# Patient Record
Sex: Male | Born: 1960
Health system: Southern US, Community
[De-identification: ages and names within clinical notes are randomized; demographics above are authoritative.]

## PROBLEM LIST (undated history)

## (undated) DIAGNOSIS — I10 Essential (primary) hypertension: Secondary | ICD-10-CM

## (undated) DIAGNOSIS — E7800 Pure hypercholesterolemia, unspecified: Secondary | ICD-10-CM

## (undated) DIAGNOSIS — R7302 Impaired glucose tolerance (oral): Secondary | ICD-10-CM

## (undated) DIAGNOSIS — E119 Type 2 diabetes mellitus without complications: Secondary | ICD-10-CM

## (undated) DIAGNOSIS — I509 Heart failure, unspecified: Secondary | ICD-10-CM

## (undated) DIAGNOSIS — K219 Gastro-esophageal reflux disease without esophagitis: Secondary | ICD-10-CM

## (undated) DIAGNOSIS — E785 Hyperlipidemia, unspecified: Secondary | ICD-10-CM

## (undated) DIAGNOSIS — N200 Calculus of kidney: Secondary | ICD-10-CM

## (undated) DIAGNOSIS — N529 Male erectile dysfunction, unspecified: Secondary | ICD-10-CM

## (undated) DIAGNOSIS — H409 Unspecified glaucoma: Secondary | ICD-10-CM

## (undated) DIAGNOSIS — T7840XA Allergy, unspecified, initial encounter: Secondary | ICD-10-CM

## (undated) DIAGNOSIS — E669 Obesity, unspecified: Secondary | ICD-10-CM

## (undated) DIAGNOSIS — M1990 Unspecified osteoarthritis, unspecified site: Secondary | ICD-10-CM

## (undated) HISTORY — PX: GYNECOMASTIA EXCISION: SHX5170

## (undated) HISTORY — DX: Impaired glucose tolerance (oral): R73.02

## (undated) HISTORY — DX: Allergy, unspecified, initial encounter: T78.40XA

## (undated) HISTORY — DX: Unspecified osteoarthritis, unspecified site: M19.90

## (undated) HISTORY — DX: Heart failure, unspecified: I50.9

## (undated) HISTORY — DX: Unspecified glaucoma: H40.9

## (undated) HISTORY — DX: Type 2 diabetes mellitus without complications: E11.9

## (undated) HISTORY — PX: POLYPECTOMY: SHX149

## (undated) HISTORY — DX: Pure hypercholesterolemia, unspecified: E78.00

## (undated) HISTORY — DX: Essential (primary) hypertension: I10

## (undated) HISTORY — PX: OTHER SURGICAL HISTORY: SHX169

## (undated) HISTORY — DX: Male erectile dysfunction, unspecified: N52.9

## (undated) HISTORY — DX: Hyperlipidemia, unspecified: E78.5

## (undated) HISTORY — PX: COLONOSCOPY: SHX174

## (undated) HISTORY — DX: Calculus of kidney: N20.0

## (undated) HISTORY — DX: Obesity, unspecified: E66.9

## (undated) HISTORY — DX: Gastro-esophageal reflux disease without esophagitis: K21.9

---

## 1993-03-22 ENCOUNTER — Encounter: Payer: Self-pay | Admitting: Internal Medicine

## 2000-10-15 ENCOUNTER — Encounter: Payer: Self-pay | Admitting: Orthopedic Surgery

## 2000-10-15 ENCOUNTER — Encounter: Admission: RE | Admit: 2000-10-15 | Discharge: 2000-10-15 | Payer: Self-pay | Admitting: Orthopedic Surgery

## 2004-08-26 ENCOUNTER — Ambulatory Visit: Payer: Self-pay | Admitting: Internal Medicine

## 2004-09-09 ENCOUNTER — Ambulatory Visit: Payer: Self-pay | Admitting: Internal Medicine

## 2005-03-29 ENCOUNTER — Ambulatory Visit: Payer: Self-pay | Admitting: Internal Medicine

## 2006-03-26 ENCOUNTER — Ambulatory Visit: Payer: Self-pay | Admitting: Internal Medicine

## 2006-04-02 ENCOUNTER — Ambulatory Visit: Payer: Self-pay | Admitting: Internal Medicine

## 2006-05-04 ENCOUNTER — Ambulatory Visit: Payer: Self-pay | Admitting: Internal Medicine

## 2006-05-04 LAB — CONVERTED CEMR LAB
BUN: 9 mg/dL (ref 6–23)
CO2: 29 meq/L (ref 19–32)
Calcium: 9.5 mg/dL (ref 8.4–10.5)
Chloride: 105 meq/L (ref 96–112)
Creatinine, Ser: 0.8 mg/dL (ref 0.4–1.5)
GFR calc non Af Amer: 111 mL/min
Glomerular Filtration Rate, Af Am: 134 mL/min/{1.73_m2}
Glucose, Bld: 98 mg/dL (ref 70–99)
Potassium: 4.1 meq/L (ref 3.5–5.1)
Sodium: 139 meq/L (ref 135–145)

## 2006-08-03 ENCOUNTER — Ambulatory Visit: Payer: Self-pay | Admitting: Internal Medicine

## 2006-11-02 ENCOUNTER — Ambulatory Visit: Payer: Self-pay | Admitting: Internal Medicine

## 2007-01-22 DIAGNOSIS — Z862 Personal history of diseases of the blood and blood-forming organs and certain disorders involving the immune mechanism: Secondary | ICD-10-CM

## 2007-01-22 DIAGNOSIS — E669 Obesity, unspecified: Secondary | ICD-10-CM

## 2007-01-22 DIAGNOSIS — J301 Allergic rhinitis due to pollen: Secondary | ICD-10-CM | POA: Insufficient documentation

## 2007-01-22 DIAGNOSIS — F528 Other sexual dysfunction not due to a substance or known physiological condition: Secondary | ICD-10-CM | POA: Insufficient documentation

## 2007-01-22 DIAGNOSIS — Z8639 Personal history of other endocrine, nutritional and metabolic disease: Secondary | ICD-10-CM

## 2007-01-22 DIAGNOSIS — K219 Gastro-esophageal reflux disease without esophagitis: Secondary | ICD-10-CM

## 2007-01-22 DIAGNOSIS — I10 Essential (primary) hypertension: Secondary | ICD-10-CM | POA: Insufficient documentation

## 2007-06-08 ENCOUNTER — Encounter: Payer: Self-pay | Admitting: Internal Medicine

## 2007-06-28 ENCOUNTER — Encounter: Payer: Self-pay | Admitting: Internal Medicine

## 2007-11-07 ENCOUNTER — Encounter: Payer: Self-pay | Admitting: Internal Medicine

## 2007-11-08 ENCOUNTER — Ambulatory Visit: Payer: Self-pay | Admitting: Internal Medicine

## 2007-11-08 DIAGNOSIS — M25519 Pain in unspecified shoulder: Secondary | ICD-10-CM

## 2008-04-26 ENCOUNTER — Ambulatory Visit: Payer: Self-pay | Admitting: Internal Medicine

## 2008-05-04 ENCOUNTER — Ambulatory Visit: Payer: Self-pay | Admitting: Internal Medicine

## 2008-05-04 DIAGNOSIS — Z87442 Personal history of urinary calculi: Secondary | ICD-10-CM

## 2008-05-04 DIAGNOSIS — J309 Allergic rhinitis, unspecified: Secondary | ICD-10-CM | POA: Insufficient documentation

## 2008-05-11 ENCOUNTER — Encounter: Payer: Self-pay | Admitting: Internal Medicine

## 2008-05-15 LAB — CONVERTED CEMR LAB
Albumin: 3.9 g/dL (ref 3.5–5.2)
Alkaline Phosphatase: 52 units/L (ref 39–117)
BUN: 14 mg/dL (ref 6–23)
Bilirubin Urine: NEGATIVE
Blood in Urine, dipstick: NEGATIVE
Cholesterol: 186 mg/dL (ref 0–200)
Creatinine, Ser: 0.8 mg/dL (ref 0.4–1.5)
Eosinophils Absolute: 0.1 10*3/uL (ref 0.0–0.7)
Eosinophils Relative: 1.2 % (ref 0.0–5.0)
GFR calc Af Amer: 133 mL/min
GFR calc non Af Amer: 110 mL/min
Glucose, Urine, Semiquant: NEGATIVE
HCT: 39.2 % (ref 39.0–52.0)
HDL: 27.2 mg/dL — ABNORMAL LOW (ref >39.0)
Ketones, urine, test strip: NEGATIVE
MCV: 89.8 fL (ref 78.0–100.0)
Monocytes Absolute: 0.4 10*3/uL (ref 0.1–1.0)
PSA: 0.4 ng/mL (ref 0.10–4.00)
Platelets: 179 10*3/uL (ref 150–400)
Potassium: 4.2 meq/L (ref 3.5–5.1)
Specific Gravity, Urine: 1.015
TSH: 1.28 microintl units/mL (ref 0.35–5.50)
Testosterone: 340.86 ng/dL — ABNORMAL LOW (ref 350.00–890)
Triglycerides: 174 mg/dL — ABNORMAL HIGH (ref 0–149)
WBC Urine, dipstick: NEGATIVE
WBC: 4.7 10*3/uL (ref 4.5–10.5)
pH: 6.5

## 2008-05-18 ENCOUNTER — Telehealth: Payer: Self-pay | Admitting: Internal Medicine

## 2008-05-26 ENCOUNTER — Ambulatory Visit: Payer: Self-pay | Admitting: Podiatry

## 2008-12-12 ENCOUNTER — Encounter: Admission: RE | Admit: 2008-12-12 | Discharge: 2008-12-12 | Payer: Self-pay | Admitting: Podiatry

## 2009-01-04 ENCOUNTER — Encounter: Payer: Self-pay | Admitting: Internal Medicine

## 2009-07-03 ENCOUNTER — Ambulatory Visit: Payer: Self-pay | Admitting: Internal Medicine

## 2009-07-03 LAB — CONVERTED CEMR LAB
AST: 27 units/L (ref 0–37)
Alkaline Phosphatase: 51 units/L (ref 39–117)
Basophils Absolute: 0 10*3/uL (ref 0.0–0.1)
Basophils Relative: 0.2 % (ref 0.0–3.0)
Bilirubin, Direct: 0.2 mg/dL (ref 0.0–0.3)
Calcium: 9.9 mg/dL (ref 8.4–10.5)
Chloride: 107 meq/L (ref 96–112)
Creatinine, Ser: 0.8 mg/dL (ref 0.4–1.5)
Direct LDL: 147.2 mg/dL
Eosinophils Absolute: 0.1 10*3/uL (ref 0.0–0.7)
GFR calc non Af Amer: 131.97 mL/min (ref >60)
Glucose, Bld: 117 mg/dL — ABNORMAL HIGH (ref 70–99)
Glucose, Urine, Semiquant: NEGATIVE
Lymphocytes Relative: 36.6 % (ref 12.0–46.0)
MCHC: 33.7 g/dL (ref 30.0–36.0)
Monocytes Relative: 10 % (ref 3.0–12.0)
Neutrophils Relative %: 51.8 % (ref 43.0–77.0)
Potassium: 4.8 meq/L (ref 3.5–5.1)
RBC: 4.55 M/uL (ref 4.22–5.81)
Specific Gravity, Urine: 1.02
Total Protein: 7.8 g/dL (ref 6.0–8.3)
WBC Urine, dipstick: NEGATIVE
pH: 5.5

## 2009-07-13 ENCOUNTER — Ambulatory Visit: Payer: Self-pay | Admitting: Internal Medicine

## 2010-01-15 ENCOUNTER — Ambulatory Visit: Payer: Self-pay | Admitting: Internal Medicine

## 2010-01-15 DIAGNOSIS — N62 Hypertrophy of breast: Secondary | ICD-10-CM

## 2010-01-16 ENCOUNTER — Telehealth: Payer: Self-pay | Admitting: Internal Medicine

## 2010-01-21 ENCOUNTER — Encounter: Payer: Self-pay | Admitting: Internal Medicine

## 2010-02-15 ENCOUNTER — Telehealth: Payer: Self-pay | Admitting: Internal Medicine

## 2010-03-26 ENCOUNTER — Ambulatory Visit: Payer: Self-pay | Admitting: Internal Medicine

## 2010-03-26 DIAGNOSIS — J069 Acute upper respiratory infection, unspecified: Secondary | ICD-10-CM | POA: Insufficient documentation

## 2010-07-12 ENCOUNTER — Ambulatory Visit: Admit: 2010-07-12 | Payer: Self-pay | Admitting: Internal Medicine

## 2010-07-18 ENCOUNTER — Ambulatory Visit
Admission: RE | Admit: 2010-07-18 | Discharge: 2010-07-18 | Payer: Self-pay | Source: Home / Self Care | Attending: Internal Medicine | Admitting: Internal Medicine

## 2010-07-26 ENCOUNTER — Ambulatory Visit
Admission: RE | Admit: 2010-07-26 | Discharge: 2010-07-26 | Payer: Self-pay | Source: Home / Self Care | Attending: Internal Medicine | Admitting: Internal Medicine

## 2010-07-26 ENCOUNTER — Other Ambulatory Visit: Payer: Self-pay | Admitting: Internal Medicine

## 2010-07-26 LAB — LIPID PANEL
Cholesterol: 220 mg/dL — ABNORMAL HIGH (ref 0–200)
HDL: 34.7 mg/dL — ABNORMAL LOW (ref >39.00)
Total CHOL/HDL Ratio: 6
Triglycerides: 177 mg/dL — ABNORMAL HIGH (ref 0.0–149.0)
VLDL: 35.4 mg/dL (ref 0.0–40.0)

## 2010-07-26 LAB — CBC WITH DIFFERENTIAL/PLATELET
Basophils Absolute: 0 10*3/uL (ref 0.0–0.1)
Basophils Relative: 0.4 % (ref 0.0–3.0)
Eosinophils Absolute: 0 10*3/uL (ref 0.0–0.7)
Eosinophils Relative: 0.9 % (ref 0.0–5.0)
HCT: 45.1 % (ref 39.0–52.0)
Hemoglobin: 15.9 g/dL (ref 13.0–17.0)
Lymphocytes Relative: 39.2 % (ref 12.0–46.0)
Lymphs Abs: 1.8 10*3/uL (ref 0.7–4.0)
MCHC: 35.3 g/dL (ref 30.0–36.0)
MCV: 89.6 fl (ref 78.0–100.0)
Monocytes Absolute: 0.4 10*3/uL (ref 0.1–1.0)
Monocytes Relative: 7.8 % (ref 3.0–12.0)
Neutro Abs: 2.4 10*3/uL (ref 1.4–7.7)
Neutrophils Relative %: 51.7 % (ref 43.0–77.0)
Platelets: 213 10*3/uL (ref 150.0–400.0)
RBC: 5.04 Mil/uL (ref 4.22–5.81)
RDW: 14 % (ref 11.5–14.6)
WBC: 4.6 10*3/uL (ref 4.5–10.5)

## 2010-07-26 LAB — HEPATIC FUNCTION PANEL
ALT: 26 U/L (ref 0–53)
AST: 25 U/L (ref 0–37)
Albumin: 4 g/dL (ref 3.5–5.2)
Alkaline Phosphatase: 70 U/L (ref 39–117)
Bilirubin, Direct: 0.2 mg/dL (ref 0.0–0.3)
Total Bilirubin: 1.3 mg/dL — ABNORMAL HIGH (ref 0.3–1.2)
Total Protein: 7.7 g/dL (ref 6.0–8.3)

## 2010-07-26 LAB — CONVERTED CEMR LAB
Blood in Urine, dipstick: NEGATIVE
Glucose, Urine, Semiquant: NEGATIVE
Ketones, urine, test strip: NEGATIVE
Specific Gravity, Urine: >=1.03
pH: 5.5

## 2010-07-26 LAB — LDL CHOLESTEROL, DIRECT: Direct LDL: 166.6 mg/dL

## 2010-07-26 LAB — PSA: PSA: 0.53 ng/mL (ref 0.10–4.00)

## 2010-07-26 LAB — BASIC METABOLIC PANEL
BUN: 14 mg/dL (ref 6–23)
CO2: 27 mEq/L (ref 19–32)
Calcium: 9.5 mg/dL (ref 8.4–10.5)
Chloride: 105 mEq/L (ref 96–112)
Creatinine, Ser: 0.8 mg/dL (ref 0.4–1.5)
GFR: 135.3 mL/min (ref >60.00)
Glucose, Bld: 117 mg/dL — ABNORMAL HIGH (ref 70–99)
Potassium: 4.2 mEq/L (ref 3.5–5.1)
Sodium: 139 mEq/L (ref 135–145)

## 2010-07-26 LAB — TSH: TSH: 1.1 u[IU]/mL (ref 0.35–5.50)

## 2010-08-02 ENCOUNTER — Telehealth: Payer: Self-pay | Admitting: Internal Medicine

## 2010-08-06 NOTE — Medication Information (Signed)
Summary: Lansoprazole Approved  Lansoprazole Approved   Imported By: Maryln Gottron 01/24/2010 11:02:59  _____________________________________________________________________  External Attachment:    Type:   Image     Comment:   External Document

## 2010-08-06 NOTE — Progress Notes (Signed)
Summary: rx for prevacid  Phone Note Call from Patient   Caller: Patient Reason for Call: Refill Medication Summary of Call: Pt adv that he needs to have a Rx for Prevacid sent to [facility] on [address] in [place]..... Pt adv that his benefit card will not pay on med (OTC) w/o a prescription.  Pt can be reached at [phone] with any questions or concerns.  Initial call taken by: Debbra Riding,  January 16, 2010 4:00 PM  Follow-up for Phone Call        Pt called and said to be sure to send it to [facility]  on [address]. [phone]    Follow-up by: Lucy Antigua,  January 16, 2010 4:04 PM  Additional Follow-up for Phone Call Additional follow up Details #1::        please advise - med list has omeprazole on it - requesting rx for prevacid.  Additional Follow-up by: Duard Brady LPN,  January 17, 2010 8:58 AM    Additional Follow-up for Phone Call Additional follow up Details #2::    Prevacid 30 mg  #90   RF 6 Follow-up by: Gordy Savers  MD,  January 17, 2010 12:28 PM  Additional Follow-up for Phone Call Additional follow up Details #3:: Details for Additional Follow-up Action Taken: change to med list - efilled [facility] Additional Follow-up by: Duard Brady LPN,  January 17, 2010 1:16 PM  New/Updated Medications: PREVACID 30 MG CPDR (LANSOPRAZOLE) qd Prescriptions: PREVACID 30 MG CPDR (LANSOPRAZOLE) qd  #90 x 6   Entered by:   Duard Brady LPN   Authorized by:   Gordy Savers  MD   Signed by:   Duard Brady LPN on 32/44/0102   Method used:   Electronically to        [facility]. [address] [phone]* (retail)       [address] [place], [place]  [id number]       Ph: [phone]       Fax: [phone]   RxID:   [id number] PREVACID 30 MG CPDR (LANSOPRAZOLE) qd  #90 x 3   Entered by:   Duard Brady LPN   Authorized by:   Gordy Savers  MD   Signed by:   Duard Brady LPN on 57/32/2025  Method used:   Electronically to        [facility] [facility] [employer] [phone]* (retail)       [address] [place], [place]  [address]       Ph: [phone] or [phone]       Fax: [phone]   RxID:   [id number]  [facility] - [facility] and cx - sent ti [facility] as indicated in phone note. KIK

## 2010-08-06 NOTE — Assessment & Plan Note (Signed)
Summary: 6 month rov/njr   Vital Signs:  Patient profile:   50 year old male Weight:      277 pounds Temp:     98.2 degrees F oral BP sitting:   110 / 70  (right arm) Cuff size:   large  Vitals Entered By: Duard Brady LPN (January 15, 2010 4:43 PM) CC: 6 mos rov - doing well Is Patient Diabetic? No   CC:  6 mos rov - doing well.  History of Present Illness: 50 -year-old patient who is sent for follow-up of his hypertension.  Medical regimen includes Aldactone, and he states that he has had progressive gynecomastia of the past two years.  He is also been diagnosed with testosterone deficiency is not been using replacement.  Medications.  He feels well today, but has not been successful at weight loss.  He has a history of mild impaired glucose tolerance with a fasting blood sugar 117 6 months ago and.  A random blood sugar today 77.  Is also on omeprazole for mild reflux symptoms  Allergies (verified): No Known Drug Allergies  Past History:  Past Medical History: Reviewed history from 07/13/2009 and no changes required. GERD Hypertension Allergic rhinitis Nephrolithiasis, hx of obesity erectile dysfunction impaired glucose tolerance  Review of Systems  The patient denies anorexia, fever, weight loss, weight gain, vision loss, decreased hearing, hoarseness, chest pain, syncope, dyspnea on exertion, peripheral edema, prolonged cough, headaches, hemoptysis, abdominal pain, melena, hematochezia, severe indigestion/heartburn, hematuria, incontinence, genital sores, muscle weakness, suspicious skin lesions, transient blindness, difficulty walking, depression, unusual weight change, abnormal bleeding, enlarged lymph nodes, angioedema, breast masses, and testicular masses.    Physical Exam  General:  Well-developed,well-nourished,in no acute distress; alert,appropriate and cooperative throughout examination; blood pressure well controlled Mouth:  Oral mucosa and oropharynx  without lesions or exudates.  Teeth in good repair. Neck:  No deformities, masses, or tenderness noted. Breasts:  gynecomastia.   Lungs:  Normal respiratory effort, chest expands symmetrically. Lungs are clear to auscultation, no crackles or wheezes. Heart:  Normal rate and regular rhythm. S1 and S2 normal without gallop, murmur, click, rub or other extra sounds. Abdomen:  Bowel sounds positive,abdomen soft and non-tender without masses, organomegaly or hernias noted. Msk:  No deformity or scoliosis noted of thoracic or lumbar spine.   Pulses:  R and L carotid,radial,femoral,dorsalis pedis and posterior tibial pulses are full and equal bilaterally   Impression & Recommendations:  Problem # 1:  GYNECOMASTIA (ICD-611.1) will discontinue the Aldactone.  Doubtful, omeprazole much of the factor.  He will consider switching to H2 blocker therapy.  His testosterone deficiency probably also a factor.  He will consider resuming Testim  Problem # 2:  HYPERTENSION (ICD-401.9)  The following medications were removed from the medication list:    Aldactone 50 Mg Tabs (Spironolactone) .Marland Kitchen... 1 once daily His updated medication list for this problem includes:    Norvasc 10 Mg Tabs (Amlodipine besylate) .Marland Kitchen... 1 once daily    Toprol Xl 100 Mg Tb24 (Metoprolol succinate) .Marland Kitchen... 1 once daily    Benazepril Hcl 40 Mg Tabs (Benazepril hcl) .Marland Kitchen... 1 once daily    Clonidine Hcl 0.1 Mg Tabs (Clonidine hcl) .Marland Kitchen... 1 at bedtime  Problem # 3:  GERD (ICD-530.81)  His updated medication list for this problem includes:    Omeprazole 20 Mg Cpdr (Omeprazole) .Marland Kitchen... 1 once daily  Problem # 4:  ERECTILE DYSFUNCTION (ICD-302.72)  The following medications were removed from the medication list:  Viagra 50 Mg Tabs (Sildenafil citrate) .Marland Kitchen... As dir His updated medication list for this problem includes:    Levitra 10 Mg Tabs (Vardenafil hcl) ..... One daily as needed  Complete Medication List: 1)  Norvasc 10 Mg Tabs  (Amlodipine besylate) .Marland Kitchen.. 1 once daily 2)  Omeprazole 20 Mg Cpdr (Omeprazole) .Marland Kitchen.. 1 once daily 3)  Toprol Xl 100 Mg Tb24 (Metoprolol succinate) .Marland Kitchen.. 1 once daily 4)  Benazepril Hcl 40 Mg Tabs (Benazepril hcl) .Marland Kitchen.. 1 once daily 5)  Clonidine Hcl 0.1 Mg Tabs (Clonidine hcl) .Marland Kitchen.. 1 at bedtime 6)  Testim 1 % Gel (Testosterone) .... Use every morning 7)  Levitra 10 Mg Tabs (Vardenafil hcl) .... One daily as needed  Patient Instructions: 1)  Limit your Sodium (Salt). 2)  It is important that you exercise regularly at least 20 minutes 5 times a week. If you develop chest pain, have severe difficulty breathing, or feel very tired , stop exercising immediately and seek medical attention. 3)  You need to lose weight. Consider a lower calorie diet and regular exercise.  4)  Check your Blood Pressure regularly. If it is above: 150/90 you should make an appointment. Prescriptions: TOPROL XL 100 MG TB24 (METOPROLOL SUCCINATE) 1 once daily  #90 x 6   Entered and Authorized by:   Gordy Savers  MD   Signed by:   Gordy Savers  MD on 01/15/2010   Method used:   Electronically to        [facility]* (retail)             ,          Ph: [phone]       Fax: [phone]   RxID:   [id number] OMEPRAZOLE 20 MG CPDR (OMEPRAZOLE) 1 once daily  #90 x 3   Entered and Authorized by:   Gordy Savers  MD   Signed by:   Gordy Savers  MD on 01/15/2010   Method used:   Electronically to        [facility]* (retail)             ,          Ph: [phone]       Fax: [phone]   RxID:   [id number] NORVASC 10 MG TABS (AMLODIPINE BESYLATE) 1 once daily  #90 x 3   Entered and Authorized by:   Gordy Savers  MD   Signed by:   Gordy Savers  MD on 01/15/2010   Method used:   Electronically to        [facility]* (retail)             ,          Ph: [phone]       Fax: [phone]   RxID:   [id number] BENAZEPRIL HCL 40 MG  TABS (BENAZEPRIL HCL)  1 once daily  #90 x 3   Entered and Authorized by:   Gordy Savers  MD   Signed by:   Gordy Savers  MD on 01/15/2010   Method used:   Electronically to        [facility]* (retail)             ,          Ph: [phone]       Fax: [phone]   RxID:   [id number] CLONIDINE HCL 0.1 MG  TABS (CLONIDINE HCL) 1 at  bedtime  #90 x 6   Entered and Authorized by:   Gordy Savers  MD   Signed by:   Gordy Savers  MD on 01/15/2010   Method used:   Electronically to        [facility]* (retail)             ,          Ph: [phone]       Fax: [phone]   RxID:   [id number] LEVITRA 10 MG TABS (VARDENAFIL HCL) one daily as needed  #6 x 6   Entered and Authorized by:   Gordy Savers  MD   Signed by:   Gordy Savers  MD on 01/15/2010   Method used:   Print then Give to Patient   RxID:   [id number] TESTIM 1 % GEL (TESTOSTERONE) use every morning  #90 x 6   Entered and Authorized by:   Gordy Savers  MD   Signed by:   Gordy Savers  MD on 01/15/2010   Method used:   Print then Give to Patient   RxID:   [id number] CLONIDINE HCL 0.1 MG  TABS (CLONIDINE HCL) 1 at bedtime  #90 x 6   Entered and Authorized by:   Gordy Savers  MD   Signed by:   Gordy Savers  MD on 01/15/2010   Method used:   Print then Give to Patient   RxID:   [id number] BENAZEPRIL HCL 40 MG  TABS (BENAZEPRIL HCL) 1 once daily  #90 x 3   Entered and Authorized by:   Gordy Savers  MD   Signed by:   Gordy Savers  MD on 01/15/2010   Method used:   Print then Give to Patient   RxID:   [id number] TOPROL XL 100 MG TB24 (METOPROLOL SUCCINATE) 1 once daily  #90 x 6   Entered and Authorized by:   Gordy Savers  MD   Signed by:   Gordy Savers  MD on 01/15/2010   Method used:   Print then Give to Patient   RxID:   [id number] OMEPRAZOLE 20 MG CPDR (OMEPRAZOLE) 1 once daily  #90 x 3   Entered and  Authorized by:   Gordy Savers  MD   Signed by:   Gordy Savers  MD on 01/15/2010   Method used:   Print then Give to Patient   RxID:   [id number] NORVASC 10 MG TABS (AMLODIPINE BESYLATE) 1 once daily  #90 x 3   Entered and Authorized by:   Gordy Savers  MD   Signed by:   Gordy Savers  MD on 01/15/2010   Method used:   Print then Give to Patient   RxID:   [id number]

## 2010-08-06 NOTE — Assessment & Plan Note (Signed)
Summary: pt has nasal congestion/cough/njr   Vital Signs:  Patient profile:   50 year old male Weight:      278 pounds Temp:     99.5 degrees F oral BP sitting:   126 / 80  (left arm) Cuff size:   large  Vitals Entered By: Duard Brady LPN (March 26, 2010 3:33 PM) CC: headache, nasal congestion , nonproductive cough - waking at night Is Patient Diabetic? No   CC:  headache, nasal congestion , and nonproductive cough - waking at night.  History of Present Illness: 50 year old patient who presents with a 3 to 4-day history of head and chest congestion.  He has had low-grade fever and nonproductive cough.  Cough is most bothersome at bedtime.  No purulence, sinus drainage.  He has had some mild hoarseness.  He has treated hypertension, which has been stable.  Allergies (verified): No Known Drug Allergies  Past History:  Past Medical History: Reviewed history from 07/13/2009 and no changes required. GERD Hypertension Allergic rhinitis Nephrolithiasis, hx of obesity erectile dysfunction impaired glucose tolerance  Past Surgical History: Reviewed history from 07/13/2009 and no changes required. R achilles tendon surgery  Review of Systems       The patient complains of hoarseness and prolonged cough.  The patient denies anorexia, fever, weight loss, weight gain, vision loss, decreased hearing, chest pain, syncope, dyspnea on exertion, peripheral edema, headaches, hemoptysis, abdominal pain, melena, hematochezia, severe indigestion/heartburn, hematuria, incontinence, genital sores, muscle weakness, suspicious skin lesions, transient blindness, difficulty walking, depression, unusual weight change, abnormal bleeding, enlarged lymph nodes, angioedema, breast masses, and testicular masses.    Physical Exam  General:  overweight-appearing.  normal blood pressure and Head:  Normocephalic and atraumatic without obvious abnormalities. No apparent alopecia or balding. Eyes:   No corneal or conjunctival inflammation noted. EOMI. Perrla. Funduscopic exam benign, without hemorrhages, exudates or papilledema. Vision grossly normal. Ears:  External ear exam shows no significant lesions or deformities.  Otoscopic examination reveals clear canals, tympanic membranes are intact bilaterally without bulging, retraction, inflammation or discharge. Hearing is grossly normal bilaterally. Mouth:  Oral mucosa and oropharynx without lesions or exudates.  Teeth in good repair. Neck:  No deformities, masses, or tenderness noted. Lungs:  Normal respiratory effort, chest expands symmetrically. Lungs are clear to auscultation, no crackles or wheezes. Heart:  Normal rate and regular rhythm. S1 and S2 normal without gallop, murmur, click, rub or other extra sounds.   Impression & Recommendations:  Problem # 1:  URI (ICD-465.9)  His updated medication list for this problem includes:    Hydrocodone-homatropine 5-1.5 Mg/84ml Syrp (Hydrocodone-homatropine) .Marland Kitchen... 1 teaspoon every 6 hours as needed for cough  Problem # 2:  HYPERTENSION (ICD-401.9)  His updated medication list for this problem includes:    Norvasc 10 Mg Tabs (Amlodipine besylate) .Marland Kitchen... 1 once daily    Toprol Xl 100 Mg Tb24 (Metoprolol succinate) .Marland Kitchen... 1 once daily    Benazepril Hcl 40 Mg Tabs (Benazepril hcl) .Marland Kitchen... 1 once daily    Clonidine Hcl 0.1 Mg Tabs (Clonidine hcl) .Marland Kitchen... 1 at bedtime  Complete Medication List: 1)  Norvasc 10 Mg Tabs (Amlodipine besylate) .Marland Kitchen.. 1 once daily 2)  Toprol Xl 100 Mg Tb24 (Metoprolol succinate) .Marland Kitchen.. 1 once daily 3)  Benazepril Hcl 40 Mg Tabs (Benazepril hcl) .Marland Kitchen.. 1 once daily 4)  Clonidine Hcl 0.1 Mg Tabs (Clonidine hcl) .Marland Kitchen.. 1 at bedtime 5)  Testim 1 % Gel (Testosterone) .... Use every morning 6)  Levitra 10 Mg Tabs (  Vardenafil hcl) .... One daily as needed 7)  Prevacid 30 Mg Cpdr (Lansoprazole) .... Qd 8)  Hydrocodone-homatropine 5-1.5 Mg/40ml Syrp (Hydrocodone-homatropine) .Marland Kitchen.. 1  teaspoon every 6 hours as needed for cough  Patient Instructions: 1)  Limit your Sodium (Salt) to less than 2 grams a day(slightly less than 1/2 a teaspoon) to prevent fluid retention, swelling, or worsening of symptoms. 2)  It is important that you exercise regularly at least 20 minutes 5 times a week. If you develop chest pain, have severe difficulty breathing, or feel very tired , stop exercising immediately and seek medical attention. 3)  You need to lose weight. Consider a lower calorie diet and regular exercise.  4)  Get plenty of rest, drink lots of clear liquids, and use Tylenol or Ibuprofen for fever and comfort. Return in 7-10 days if you're not better:sooner if you're feeling worse. Prescriptions: HYDROCODONE-HOMATROPINE 5-1.5 MG/5ML SYRP (HYDROCODONE-HOMATROPINE) 1 teaspoon every 6 hours as needed for cough  #6 oz x 0   Entered and Authorized by:   Gordy Savers  MD   Signed by:   Gordy Savers  MD on 03/26/2010   Method used:   Print then Mail to Patient   RxID:   [id number]

## 2010-08-06 NOTE — Progress Notes (Signed)
Summary: [facility] - lansoprazole  Phone Note Refill Request Message from:  Fax from Pharmacy on February 15, 2010 2:36 PM  Refills Requested: Medication #1:  PREVACID 30 MG CPDR qd. [facility]   Method Requested: Fax to Local Pharmacy Initial call taken by: Duard Brady LPN,  February 15, 2010 2:36 PM    Prescriptions: PREVACID 30 MG CPDR (LANSOPRAZOLE) qd  #90 x 3   Entered by:   Duard Brady LPN   Authorized by:   Gordy Savers  MD   Signed by:   Duard Brady LPN on 16/04/9603   Method used:   Faxed to ...       [facility]* (retail)             ,          Ph: [phone]       Fax: [phone]   RxID:   [id number]

## 2010-08-06 NOTE — Assessment & Plan Note (Signed)
Summary: cpx/cjr/pt rsc/cjr   Vital Signs:  Patient profile:   50 year old male Weight:      277 pounds Temp:     98.8 degrees F oral BP sitting:   120 / 86  (left arm) Cuff size:   large CC: CPX, labs done.   CC:  CPX and labs done.Marland Kitchen  History of Present Illness: 50 year old patient who is seen today for a comprehensive evaluation.  Medical problems include sauces, obesity, treated hypertension impaired glucose tolerance.  He has erectile dysfunction.  And also a history of gastroesophageal reflux disease.  Allergies: No Known Drug Allergies  Past History:  Past Medical History: GERD Hypertension Allergic rhinitis Nephrolithiasis, hx of obesity erectile dysfunction impaired glucose tolerance  Past Surgical History: R achilles tendon surgery  Family History: Reviewed history from 05/04/2008 and no changes required. Family History Diabetes 1st degree relative Family History Hypertension Family History Other cancer-Colon, Breast  father history of obesity, type 2 diabetes, hypertension mother history of hypertension two brothers, both with hypertension one sister in good health  Family history positive for  breast cancer, and colon cancer  Social History: Reviewed history from 05/04/2008 and no changes required. Married Never Smoked 2 children  d (190, S (14)  Review of Systems  The patient denies anorexia, fever, weight loss, weight gain, vision loss, decreased hearing, hoarseness, chest pain, syncope, dyspnea on exertion, peripheral edema, prolonged cough, headaches, hemoptysis, abdominal pain, melena, hematochezia, severe indigestion/heartburn, hematuria, incontinence, genital sores, muscle weakness, suspicious skin lesions, transient blindness, difficulty walking, depression, unusual weight change, abnormal bleeding, enlarged lymph nodes, angioedema, breast masses, and testicular masses.    Physical Exam  General:  overweight-appearing.  120/78 Head:   Normocephalic and atraumatic without obvious abnormalities. No apparent alopecia or balding. Eyes:  No corneal or conjunctival inflammation noted. EOMI. Perrla. Funduscopic exam benign, without hemorrhages, exudates or papilledema. Vision grossly normal. Ears:  External ear exam shows no significant lesions or deformities.  Otoscopic examination reveals clear canals, tympanic membranes are intact bilaterally without bulging, retraction, inflammation or discharge. Hearing is grossly normal bilaterally. Nose:  External nasal examination shows no deformity or inflammation. Nasal mucosa are pink and moist without lesions or exudates. Mouth:  Oral mucosa and oropharynx without lesions or exudates.  Teeth in good repair. Neck:  No deformities, masses, or tenderness noted. Chest Wall:  No deformities, masses, tenderness or gynecomastia noted. Breasts:  No masses or gynecomastia noted Lungs:  Normal respiratory effort, chest expands symmetrically. Lungs are clear to auscultation, no crackles or wheezes. Heart:  Normal rate and regular rhythm. S1 and S2 normal without gallop, murmur, click, rub or other extra sounds. Abdomen:  Bowel sounds positive,abdomen soft and non-tender without masses, organomegaly or hernias noted. Rectal:  No external abnormalities noted. Normal sphincter tone. No rectal masses or tenderness. Genitalia:  Testes bilaterally descended without nodularity, tenderness or masses. No scrotal masses or lesions. No penis lesions or urethral discharge. Prostate:  Prostate gland firm and smooth, no enlargement, nodularity, tenderness, mass, asymmetry or induration. Msk:  No deformity or scoliosis noted of thoracic or lumbar spine.   Pulses:  R and L carotid,radial,femoral,dorsalis pedis and posterior tibial pulses are full and equal bilaterally Extremities:  No clubbing, cyanosis, edema, or deformity noted with normal full range of motion of all joints.   Neurologic:  No cranial nerve deficits  noted. Station and gait are normal. Plantar reflexes are down-going bilaterally. DTRs are symmetrical throughout. Sensory, motor and coordinative functions appear intact. Skin:  Intact without suspicious lesions or rashes Cervical Nodes:  No lymphadenopathy noted Axillary Nodes:  No palpable lymphadenopathy Inguinal Nodes:  No significant adenopathy Psych:  Cognition and judgment appear intact. Alert and cooperative with normal attention span and concentration. No apparent delusions, illusions, hallucinations   Impression & Recommendations:  Problem # 1:  NEPHROLITHIASIS, HX OF (ICD-V13.01)  Problem # 2:  ALLERGIC RHINITIS (ICD-477.9)  Problem # 3:  EXOGENOUS OBESITY, MILD (ICD-278.00)  Problem # 4:  HYPERTENSION (ICD-401.9)  His updated medication list for this problem includes:    Norvasc 10 Mg Tabs (Amlodipine besylate) .Marland Kitchen... 1 once daily    Toprol Xl 100 Mg Tb24 (Metoprolol succinate) .Marland Kitchen... 1 once daily    Benazepril Hcl 40 Mg Tabs (Benazepril hcl) .Marland Kitchen... 1 once daily    Aldactone 50 Mg Tabs (Spironolactone) .Marland Kitchen... 1 once daily    Clonidine Hcl 0.1 Mg Tabs (Clonidine hcl) .Marland Kitchen... 1 at bedtime  Orders: Prescription Created Electronically [phone])  Complete Medication List: 1)  Norvasc 10 Mg Tabs (Amlodipine besylate) .Marland Kitchen.. 1 once daily 2)  Omeprazole 20 Mg Cpdr (Omeprazole) .Marland Kitchen.. 1 once daily 3)  Toprol Xl 100 Mg Tb24 (Metoprolol succinate) .Marland Kitchen.. 1 once daily 4)  Viagra 50 Mg Tabs (Sildenafil citrate) .... As dir 5)  Benazepril Hcl 40 Mg Tabs (Benazepril hcl) .Marland Kitchen.. 1 once daily 6)  Aldactone 50 Mg Tabs (Spironolactone) .Marland Kitchen.. 1 once daily 7)  Clonidine Hcl 0.1 Mg Tabs (Clonidine hcl) .Marland Kitchen.. 1 at bedtime 8)  Testim 1 % Gel (Testosterone) .... Use every morning  Patient Instructions: 1)  Please schedule a follow-up appointment in 6 months. 2)  Limit your Sodium (Salt). 3)  It is important that you exercise regularly at least 20 minutes 5 times a week. If you develop chest pain, have  severe difficulty breathing, or feel very tired , stop exercising immediately and seek medical attention. 4)  You need to lose weight. Consider a lower calorie diet and regular exercise.  5)  Check your Blood Pressure regularly. If it is above: 150/90 you should make an appointment. Who is inPrescriptions: TESTIM 1 % GEL (TESTOSTERONE) use every morning  #90 x 6   Entered and Authorized by:   Gordy Savers  MD   Signed by:   Gordy Savers  MD on 07/13/2009   Method used:   Print then Give to Patient   RxID:   [id number] CLONIDINE HCL 0.1 MG  TABS (CLONIDINE HCL) 1 at bedtime  #90 x 6   Entered and Authorized by:   Gordy Savers  MD   Signed by:   Gordy Savers  MD on 07/13/2009   Method used:   Electronically to        [facility]* (mail-order)             ,          Ph: [phone]       Fax: [phone]   RxID:   [id number] ALDACTONE 50 MG  TABS (SPIRONOLACTONE) 1 once daily  #90 x 3   Entered and Authorized by:   Gordy Savers  MD   Signed by:   Gordy Savers  MD on 07/13/2009   Method used:   Electronically to        [facility]* (mail-order)             ,          Ph: [phone]       Fax:  [phone]   RxID:   [id number] BENAZEPRIL HCL 40 MG  TABS (BENAZEPRIL HCL) 1 once daily  #90 x 3   Entered and Authorized by:   Gordy Savers  MD   Signed by:   Gordy Savers  MD on 07/13/2009   Method used:   Electronically to        [facility]* (mail-order)             ,          Ph: [phone]       Fax: [phone]   RxID:   [id number] VIAGRA 50 MG TABS (SILDENAFIL CITRATE) as dir  #12 x 6   Entered and Authorized by:   Gordy Savers  MD   Signed by:   Gordy Savers  MD on 07/13/2009   Method used:   Electronically to        [facility]* (mail-order)             ,          Ph: [phone]       Fax: [phone]   RxID:   [id number] TOPROL XL 100 MG TB24 (METOPROLOL  SUCCINATE) 1 once daily  #90 x 6   Entered and Authorized by:   Gordy Savers  MD   Signed by:   Gordy Savers  MD on 07/13/2009   Method used:   Electronically to        [facility]* (mail-order)             ,          Ph: [phone]       Fax: [phone]   RxID:   [id number] OMEPRAZOLE 20 MG CPDR (OMEPRAZOLE) 1 once daily  #90 x 3   Entered and Authorized by:   Gordy Savers  MD   Signed by:   Gordy Savers  MD on 07/13/2009   Method used:   Electronically to        [facility]* (mail-order)             ,          Ph: [phone]       Fax: [phone]   RxID:   [id number] NORVASC 10 MG TABS (AMLODIPINE BESYLATE) 1 once daily  #90 x 3   Entered and Authorized by:   Gordy Savers  MD   Signed by:   Gordy Savers  MD on 07/13/2009   Method used:   Electronically to        [facility]* (mail-order)             ,          Ph: [phone]       Fax: [phone]   RxID:   [id number]

## 2010-08-08 NOTE — Assessment & Plan Note (Signed)
Summary: cpx/njr   Vital Signs:  Patient profile:   50 year old male Height:      68 inches Weight:      282 pounds BMI:     43.03 Temp:     98.5 degrees F oral BP sitting:   130 / 80  (left arm) Cuff size:   large  Vitals Entered By: Duard Brady LPN (July 18, 2010 2:46 PM) CC: cpx - doing well Is Patient Diabetic? No   CC:  cpx - doing well.  History of Present Illness: 1 -year-old patient who is seen today for a health maintenance examination.  He has a history of hypertension, testosterone deficiency exogenous obesity.  He is doing quite well.  Exercises infrequently, but no exercise limitations.  There's been some modest weight gain over the past year.  He has Gastrosoft reflux disease, which has been stable.  He has tolerated medications well except for some mild pedal edema  Preventive Screening-Counseling & Management  Caffeine-Diet-Exercise     Does Patient Exercise: yes  Allergies (verified): No Known Drug Allergies  Past History:  Past Medical History: Reviewed history from 07/13/2009 and no changes required. GERD Hypertension Allergic rhinitis Nephrolithiasis, hx of obesity erectile dysfunction impaired glucose tolerance  Past Surgical History: Reviewed history from 07/13/2009 and no changes required. R achilles tendon surgery  Family History: Reviewed history from 05/04/2008 and no changes required. Family History Diabetes 1st degree relative Family History Hypertension Family History Other cancer-Colon, Breast  father 73  history of obesity, type 2 diabetes, hypertension mother 53   history of hypertension (MGM age 50) two brothers, both with hypertension one sister in good health  Family history positive for  breast cancer, and colon cancer  Social History: Reviewed history from 07/13/2009 and no changes required. Married Never Smoked 2 children  d (190, S (14) Regular exercise-yes Does Patient Exercise:  yes  Review of  Systems       The patient complains of weight gain and peripheral edema.  The patient denies anorexia, fever, weight loss, vision loss, decreased hearing, hoarseness, chest pain, syncope, dyspnea on exertion, prolonged cough, headaches, hemoptysis, abdominal pain, melena, hematochezia, severe indigestion/heartburn, hematuria, incontinence, genital sores, muscle weakness, suspicious skin lesions, transient blindness, difficulty walking, depression, unusual weight change, abnormal bleeding, enlarged lymph nodes, angioedema, breast masses, and testicular masses.    Physical Exam  General:  overweight-appearing.  130/80overweight-appearing.   Head:  Normocephalic and atraumatic without obvious abnormalities. No apparent alopecia or balding. Eyes:  No corneal or conjunctival inflammation noted. EOMI. Perrla. Funduscopic exam benign, without hemorrhages, exudates or papilledema. Vision grossly normal. Ears:  External ear exam shows no significant lesions or deformities.  Otoscopic examination reveals clear canals, tympanic membranes are intact bilaterally without bulging, retraction, inflammation or discharge. Hearing is grossly normal bilaterally. Nose:  External nasal examination shows no deformity or inflammation. Nasal mucosa are pink and moist without lesions or exudates. Mouth:  Oral mucosa and oropharynx without lesions or exudates.  Teeth in good repair. Neck:  No deformities, masses, or tenderness noted. Chest Wall:  No deformities, masses, tenderness or gynecomastia noted. Breasts:  No masses or gynecomastia noted Lungs:  Normal respiratory effort, chest expands symmetrically. Lungs are clear to auscultation, no crackles or wheezes. Heart:  Normal rate and regular rhythm. S1 and S2 normal without gallop, murmur, click, rub or other extra sounds. Abdomen:  Bowel sounds positive,abdomen soft and non-tender without masses, organomegaly or hernias noted. Rectal:  No external abnormalities noted.  Normal sphincter tone. No rectal masses or tenderness. Genitalia:  Testes bilaterally descended without nodularity, tenderness or masses. No scrotal masses or lesions. No penis lesions or urethral discharge. Prostate:  Prostate gland firm and smooth, no enlargement, nodularity, tenderness, mass, asymmetry or induration. Msk:  No deformity or scoliosis noted of thoracic or lumbar spine.   Pulses:  R and L carotid,radial,femoral,dorsalis pedis and posterior tibial pulses are full and equal bilaterally Extremities:  No clubbing, cyanosis, edema, or deformity noted with normal full range of motion of all joints.   Neurologic:  No cranial nerve deficits noted. Station and gait are normal. Plantar reflexes are down-going bilaterally. DTRs are symmetrical throughout. Sensory, motor and coordinative functions appear intact. Skin:  Intact without suspicious lesions or rashes Cervical Nodes:  No lymphadenopathy noted Axillary Nodes:  No palpable lymphadenopathy Inguinal Nodes:  No significant adenopathy Psych:  Cognition and judgment appear intact. Alert and cooperative with normal attention span and concentration. No apparent delusions, illusions, hallucinations   Impression & Recommendations:  Problem # 1:  Preventive Health Care (ICD-V70.0)  Orders: Gastroenterology Referral (GI)  Complete Medication List: 1)  Norvasc 10 Mg Tabs (Amlodipine besylate) .Marland Kitchen.. 1 once daily 2)  Toprol Xl 100 Mg Tb24 (Metoprolol succinate) .Marland Kitchen.. 1 once daily 3)  Benazepril Hcl 40 Mg Tabs (Benazepril hcl) .Marland Kitchen.. 1 once daily 4)  Clonidine Hcl 0.1 Mg Tabs (Clonidine hcl) .Marland Kitchen.. 1 at bedtime 5)  Testim 1 % Gel (Testosterone) .... Use every morning 6)  Levitra 10 Mg Tabs (Vardenafil hcl) .... One daily as needed 7)  Prevacid 30 Mg Cpdr (Lansoprazole) .... Qd 8)  Hydrocodone-homatropine 5-1.5 Mg/25ml Syrp (Hydrocodone-homatropine) .Marland Kitchen.. 1 teaspoon every 6 hours as needed for cough  Patient Instructions: 1)  Please schedule a  follow-up appointment in 6 months. 2)  Limit your Sodium (Salt). 3)  It is important that you exercise regularly at least 20 minutes 5 times a week. If you develop chest pain, have severe difficulty breathing, or feel very tired , stop exercising immediately and seek medical attention. 4)  You need to lose weight. Consider a lower calorie diet and regular exercise.  5)  fasting CPX lab including PSA at patient's convenience Prescriptions: PREVACID 30 MG CPDR (LANSOPRAZOLE) qd  #90 x 3   Entered and Authorized by:   Gordy Savers  MD   Signed by:   Gordy Savers  MD on 07/18/2010   Method used:   Electronically to        [facility] [phone]* (retail)       [address] [place], [place]  [address]       Ph: [phone] or [phone]       Fax: [phone]   RxID:   [id number] LEVITRA 10 MG TABS (VARDENAFIL HCL) one daily as needed  #6 x 6   Entered and Authorized by:   Gordy Savers  MD   Signed by:   Gordy Savers  MD on 07/18/2010   Method used:   Electronically to        [facility] [phone]* (retail)       [address] [place], [place]  [address]       Ph: [phone] or [phone]       Fax: [phone]   RxID:  [id number] TESTIM 1 % GEL (TESTOSTERONE) use every morning Brand medically necessary #150 Gram x 4   Entered and Authorized by:   Gordy Savers  MD   Signed by:   Gordy Savers  MD on 07/18/2010   Method used:   Electronically to        [facility] [phone]* (retail)       [address] [place], [place]  [address]       Ph: [phone] or [phone]       Fax: [phone]   RxID:   [id number] CLONIDINE HCL 0.1 MG  TABS (CLONIDINE HCL) 1 at bedtime  #90 x 6   Entered and Authorized by:   Gordy Savers  MD   Signed by:   Gordy Savers  MD on 07/18/2010   Method used:   Electronically to        [facility] [phone]*  (retail)       [address] [place], [place]  [address]       Ph: [phone] or [phone]       Fax: [phone]   RxID:   [id number] BENAZEPRIL HCL 40 MG  TABS (BENAZEPRIL HCL) 1 once daily  #90 x 3   Entered and Authorized by:   Gordy Savers  MD   Signed by:   Gordy Savers  MD on 07/18/2010   Method used:   Electronically to        [facility] [phone]* (retail)       [address]       [place], [place]  [address]       Ph: [phone] or [phone]       Fax: [phone]   RxID:   [id number] TOPROL XL 100 MG TB24 (METOPROLOL SUCCINATE) 1 once daily  #90 x 6   Entered and Authorized by:   Gordy Savers  MD   Signed by:   Gordy Savers  MD on 07/18/2010   Method used:   Electronically to        [facility] [phone]* (retail)       [address] [place], [place]  [address]       Ph: [phone] or [phone]       Fax: [phone]   RxID:   [id number] NORVASC 10 MG TABS (AMLODIPINE BESYLATE) 1 once daily  #90 x 3   Entered and Authorized by:   Gordy Savers  MD   Signed by:   Gordy Savers  MD on 07/18/2010   Method used:   Electronically to        [facility] [phone]* (retail)       [address] [place], [place]  [address]       Ph: [phone] or [phone]       Fax: [phone]   RxID:   [id number]    Orders Added: 1)  Est. Patient 40-64 years [[address]] 2)  Gastroenterology Referral [GI]

## 2010-08-08 NOTE — Progress Notes (Signed)
Summary: Pt called to get lab results.  Phone Note Call from Patient Call back at Home Phone [phone]   Caller: Patient Summary of Call: Pt called to get lab results. Pls call back asap.  Initial call taken by: Lucy Antigua,  August 02, 2010 8:34 AM  Follow-up for Phone Call        spoke with pt - informed of labs and Dr. Vernon Prey instructions - per request - copy mailed to hm address  KIk Follow-up by: Duard Brady LPN,  August 02, 2010 1:09 PM    All lab okay, except cholesterol slightly elevated at 220;  encourage better diet, weight loss and exercise

## 2010-08-12 ENCOUNTER — Encounter (INDEPENDENT_AMBULATORY_CARE_PROVIDER_SITE_OTHER): Payer: Self-pay | Admitting: *Deleted

## 2010-08-22 ENCOUNTER — Encounter (INDEPENDENT_AMBULATORY_CARE_PROVIDER_SITE_OTHER): Payer: Self-pay | Admitting: *Deleted

## 2010-08-22 NOTE — Letter (Signed)
Summary: Pre Visit Letter Revised  [facility]  [address] [place], [place] [address]   Phone: [phone]  Fax: [phone]        08/12/2010 MRN: [id number] Robert Mcguire [address] [place], [place]  [address]             Procedure Date:  09-05-10   Welcome to the Gastroenterology Division at [facility].    You are scheduled to see a nurse for your pre-procedure visit on 08-23-10 at 4:00P.M. on the 3rd floor at [facility], [address]  We ask that you try to arrive at our office 15 minutes prior to your appointment time to allow for check-in.  Please take a minute to review the attached form.  If you answer "Yes" to one or more of the questions on the first page, we ask that you call the person listed at your earliest opportunity.  If you answer "No" to all of the questions, please complete the rest of the form and bring it to your appointment.    Your nurse visit will consist of discussing your medical and surgical history, your immediate family medical history, and your medications.   If you are unable to list all of your medications on the form, please bring the medication bottles to your appointment and we will list them.  We will need to be aware of both prescribed and over the counter drugs.  We will need to know exact dosage information as well.    Please be prepared to read and sign documents such as consent forms, a financial agreement, and acknowledgement forms.  If necessary, and with your consent, a friend or relative is welcome to sit-in on the nurse visit with you.  Please bring your insurance card so that we may make a copy of it.  If your insurance requires a referral to see a specialist, please bring your referral form from your primary care physician.  No co-pay is required for this nurse visit.     If you cannot keep your appointment, please call [phone] to cancel or reschedule prior to your appointment date.  This allows [place] the  opportunity to schedule an appointment for another patient in need of care.    Thank you for choosing [facility] for your medical needs.  We appreciate the opportunity to care for you.  Please visit [place] at our website  to learn more about our practice.  Sincerely, The Gastroenterology Division

## 2010-08-23 ENCOUNTER — Encounter: Payer: Self-pay | Admitting: Gastroenterology

## 2010-08-28 NOTE — Letter (Signed)
Summary: Moviprep Instructions  [facility]  [address]   [place], [place] [address]   Phone: [phone]  Fax: [phone]       Robert Mcguire    February 16, 1961    MRN: [id number]        Procedure Day Dorna Bloom: Thursday, 09-05-10     Arrival Time: 10:30 a.m.     Procedure Time: 11:30 a.m.     Location of Procedure:                    x  [facility] (4th Floor)   PREPARATION FOR COLONOSCOPY WITH MOVIPREP   Starting 5 days prior to your procedure 08-31-10 do not eat nuts, seeds, popcorn, corn, beans, peas,  salads, or any raw vegetables.  Do not take any fiber supplements (e.g. Metamucil, Citrucel, and Benefiber).  THE DAY BEFORE YOUR PROCEDURE         DATE: 09-04-10  DAY: Wednesday  1.  Drink clear liquids the entire day-NO SOLID FOOD  2.  Do not drink anything colored red or purple.  Avoid juices with pulp.  No orange juice.  3.  Drink at least 64 oz. (8 glasses) of fluid/clear liquids during the day to prevent dehydration and help the prep work efficiently.  CLEAR LIQUIDS INCLUDE: Water Jello Ice Popsicles Tea (sugar ok, no milk/cream) Powdered fruit flavored drinks Coffee (sugar ok, no milk/cream) Gatorade Juice: apple, white grape, white cranberry  Lemonade Clear bullion, consomm, broth Carbonated beverages (any kind) Strained chicken noodle soup Hard Candy                             4.  In the morning, mix first dose of MoviPrep solution:    Empty 1 Pouch A and 1 Pouch B into the disposable container    Add lukewarm drinking water to the top line of the container. Mix to dissolve    Refrigerate (mixed solution should be used within 24 hrs)  5.  Begin drinking the prep at 5:00 p.m. The MoviPrep container is divided by 4 marks.   Every 15 minutes drink the solution down to the next mark (approximately 8 oz) until the full liter is complete.   6.  Follow completed prep with 16 oz of clear liquid of your choice (Nothing red or purple).   Continue to drink clear liquids until bedtime.  7.  Before going to bed, mix second dose of MoviPrep solution:    Empty 1 Pouch A and 1 Pouch B into the disposable container    Add lukewarm drinking water to the top line of the container. Mix to dissolve    Refrigerate  THE DAY OF YOUR PROCEDURE      DATE: 09-05-10  DAY: Thursday  Beginning at 6:30 a.m. (5 hours before procedure):         1. Every 15 minutes, drink the solution down to the next mark (approx 8 oz) until the full liter is complete.  2. Follow completed prep with 16 oz. of clear liquid of your choice.    3. You may drink clear liquids until 9:30 a.m.  (2 HOURS BEFORE PROCEDURE).   MEDICATION INSTRUCTIONS  Unless otherwise instructed, you should take regular prescription medications with a small sip of water   as early as possible the morning of your procedure.         OTHER INSTRUCTIONS  You will need a responsible adult at  least 51 years of age to accompany you and drive you home.   This person must remain in the waiting room during your procedure.  Wear loose fitting clothing that is easily removed.  Leave jewelry and other valuables at home.  However, you may wish to bring a book to read or  an iPod/MP3 player to listen to music as you wait for your procedure to start.  Remove all body piercing jewelry and leave at home.  Total time from sign-in until discharge is approximately 2-3 hours.  You should go home directly after your procedure and rest.  You can resume normal activities the  day after your procedure.  The day of your procedure you should not:   Drive   Make legal decisions   Operate machinery   Drink alcohol   Return to work  You will receive specific instructions about eating, activities and medications before you leave.    The above instructions have been reviewed and explained to me by   Durwin Glaze RN  August 23, 2010 4:34 PM     I fully understand and can verbalize  these instructions _____________________________ Date _________

## 2010-08-28 NOTE — Miscellaneous (Signed)
Summary: [facility]  Clinical Lists Changes  Medications: Added new medication of MOVIPREP 100 GM  SOLR (PEG-KCL-NACL-NASULF-NA ASC-C) As per prep instructions. - Signed Rx of MOVIPREP 100 GM  SOLR (PEG-KCL-NACL-NASULF-NA ASC-C) As per prep instructions.;  #1 x 0;  Signed;  Entered by: Durwin Glaze RN;  Authorized by: Louis Meckel MD;  Method used: Electronically to [facility]. [facility] [phone]*, [address], [place], [place]  [id number], Ph: [phone], Fax: [phone] Observations: Added new observation of NKA: T (08/23/2010 15:37)    Prescriptions: MOVIPREP 100 GM  SOLR (PEG-KCL-NACL-NASULF-NA ASC-C) As per prep instructions.  #1 x 0   Entered by:   Durwin Glaze RN   Authorized by:   Louis Meckel MD   Signed by:   Durwin Glaze RN on 08/23/2010   Method used:   Electronically to        [facility]. [address] [phone]* (retail)       [address] [place], [place]  [id number]       Ph: [phone]       Fax: [phone]   RxID:   [id number]

## 2010-09-05 ENCOUNTER — Other Ambulatory Visit: Payer: Self-pay | Admitting: Gastroenterology

## 2010-09-05 ENCOUNTER — Other Ambulatory Visit (AMBULATORY_SURGERY_CENTER): Payer: BC Managed Care – PPO | Admitting: Gastroenterology

## 2010-09-05 DIAGNOSIS — D126 Benign neoplasm of colon, unspecified: Secondary | ICD-10-CM

## 2010-09-05 DIAGNOSIS — Z1211 Encounter for screening for malignant neoplasm of colon: Secondary | ICD-10-CM

## 2010-09-12 ENCOUNTER — Encounter: Payer: Self-pay | Admitting: Gastroenterology

## 2010-09-12 NOTE — Procedures (Addendum)
Summary: Colonoscopy  Patient: Robert Mcguire Note: All result statuses are Final unless otherwise noted.  Tests: (1) Colonoscopy (COL)   COL Colonoscopy           DONE     [facility]     [address]     [place], [place]  [address]          COLONOSCOPY PROCEDURE REPORT          PATIENT:  Robert, Mcguire  MR#:  [id number]     BIRTHDATE:  October 22, 1960, 49 yrs. old  GENDER:  male          ENDOSCOPIST:  Barbette Hair. Arlyce Dice, MD     Referred by:  Eleonore Chiquito, M.D.          PROCEDURE DATE:  09/05/2010     PROCEDURE:  Colonoscopy with snare polypectomy     ASA CLASS:  Class II     INDICATIONS:  1) Routine Risk Screening          MEDICATIONS:   Fentanyl 50 mcg IV, Versed 6 mg IV          DESCRIPTION OF PROCEDURE:   After the risks benefits and     alternatives of the procedure were thoroughly explained, informed     consent was obtained.  Digital rectal exam was performed and     revealed no abnormalities.   The [id number] endoscope was     introduced through the anus and advanced to the cecum, which was     identified by both the appendix and ileocecal valve, without     limitations.  The quality of the prep was good, using MoviPrep.     The instrument was then slowly withdrawn as the colon was fully     examined.     <<PROCEDUREIMAGES>>          FINDINGS:  Two polyps were found in the ascending colon (see     image5 and image7). 2 sessile polyps 4 and 2mm removed via cold     polypectomy snare and submitted to pathology  A sessile polyp was     found in the sigmoid colon. It was 3 mm in size. It was found 20     cm from the point of entry. Polyp was snared without cautery.     Retrieval was successful (see image12). snare polyp  This was     otherwise a normal examination of the colon (see image2, image3,     image8, image10, image13, and image14).   Retroflexed views in the     rectum revealed no abnormalities.    The time to cecum =  2.50     minutes. The scope  was then withdrawn (time =  9.75  min) from the     patient and the procedure completed.          COMPLICATIONS:  None          ENDOSCOPIC IMPRESSION:     1) Two polyps in the ascending colon     2) 3 mm sessile polyp in the sigmoid colon     3) Otherwise normal examination     RECOMMENDATIONS:     1) If the polyp(s) removed today are proven to be adenomatous     (pre-cancerous) polyps, you will need a repeat colonoscopy in 5     years. Otherwise you should continue to follow colorectal cancer     screening guidelines for "routine risk"  patients with colonoscopy     in 10 years.          REPEAT EXAM:   You will receive a letter from Dr. Arlyce Dice in 1-2     weeks, after reviewing the final pathology, with followup     recommendations.          ______________________________     Barbette Hair Arlyce Dice, MD          CC:          n.     eSIGNED:   Barbette Hair. Milagro Belmares at 09/05/2010 12:23 PM          Butch Penny, [phone]  Note: An exclamation mark (!) indicates a result that was not dispersed into the flowsheet. Document Creation Date: 09/05/2010 12:24 PM _______________________________________________________________________  (1) Order result status: Final Collection or observation date-time: 09/05/2010 12:18 Requested date-time:  Receipt date-time:  Reported date-time:  Referring Physician:   Ordering Physician: Melvia Heaps [phone]) Specimen Source:  Source: Launa Grill Order Number: [id number] Lab site:   Appended Document: Colonoscopy     Procedures Next Due Date:    Colonoscopy: 09/2015

## 2010-09-17 NOTE — Letter (Signed)
Summary: Patient Notice- Polyp Results  [facility]  [address] [place], [place] [address]   Phone: [phone]  Fax: [phone]        September 12, 2010 MRN: [id number]    Robert Mcguire [address] [place], [place]  [address]    Dear Mr. MORAIS,  I am pleased to inform you that the colon polyp(s) removed during your recent colonoscopy was (were) found to be benign (no cancer detected) upon pathologic examination.  I recommend you have a repeat colonoscopy examination in _5 years to look for recurrent polyps, as having colon polyps increases your risk for having recurrent polyps or even colon cancer in the future.  Should you develop new or worsening symptoms of abdominal pain, bowel habit changes or bleeding from the rectum or bowels, please schedule an evaluation with either your primary care physician or with me.  Additional information/recommendations:  __ No further action with gastroenterology is needed at this time. Please      follow-up with your primary care physician for your other healthcare      needs.  __ Please call [phone] to schedule a return visit to review your      situation.  __ Please keep your follow-up visit as already scheduled.  _x_ Continue treatment plan as outlined the day of your exam.  Please call us if you are having persistent problems or have questions about your condition that have not been fully answered at this time.  Sincerely,  Louis Meckel MD  This letter has been electronically signed by your physician.  Appended Document: Patient Notice- Polyp Results letter mailed

## 2010-11-05 ENCOUNTER — Telehealth: Payer: Self-pay | Admitting: *Deleted

## 2010-11-05 MED ORDER — MECLIZINE HCL 25 MG PO TABS: 25.0000 mg | ORAL_TABLET | Freq: Four times a day (QID) | ORAL | Status: AC | PRN

## 2010-11-05 NOTE — Telephone Encounter (Signed)
Would like Dr. Kirtland Bouchard to prescribe him something for motion sickness.

## 2010-11-05 NOTE — Telephone Encounter (Signed)
Attempt to call - VM - LMTCB ,need pharmacy to sent med to. KIK

## 2010-11-05 NOTE — Telephone Encounter (Signed)
Meclizine 25 mg. #30 one every 6 hours when necessary  to prevent or treat  motion sickness

## 2010-11-22 NOTE — Assessment & Plan Note (Signed)
[facility] OFFICE NOTE   NAME:COOPERMelville, Engen                         MRN:          [id number]  DATE:04/02/2006                            DOB:          07/18/1960    A 50 year old gentleman seen today for a wellness exam.  He has  hypertension, a history of renal stones, mild exogenous obesity.  There is  also a history of seasonal allergic rhinitis, gastroesophageal reflux  disease, and erectile dysfunction.  He was hospitalized at age 50 for a soft  tissue infection involving the hand, otherwise no hospital admission.   FAMILY HISTORY:  Unchanged, positive for hypertension and diabetes.  Grandparents have had colon and breast cancer.   PHYSICAL EXAMINATION:  GENERAL:  Exam revealed a mildly overweight black  male in no acute distress.  VITAL SIGNS:  Blood pressure 140/90.  HEENT:  Fundi, ear, nose and throat clear.  NECK:  No bruits or adenopathy.  CHEST:  Clear.  CARDIOVASCULAR:  Normal heart sounds, no murmurs.  ABDOMEN:  Obese, soft and nontender.  No organomegaly.  GENITOURINARY:  External genitalia normal.  RECTAL:  Prostate benign.  Stool heme-negative.  EXTREMITIES:  Full peripheral pulses, no edema.  NEUROLOGIC:  Negative.   IMPRESSION:  1. Hypertension.  2. History of hypokalemia.  3. Renal stones.   DISPOSITION:  Will discontinue the patient's hydrochlorothiazide and switch  to benazepril and Aldactone.  We will recheck potassium in 3-4 weeks.  Medical regimen otherwise unchanged.            ______________________________  Gordy Savers, MD     PFK/MedQ  DD:  04/02/2006  DT:  04/04/2006  Job #:  [id number]

## 2011-01-05 ENCOUNTER — Other Ambulatory Visit: Payer: Self-pay | Admitting: Internal Medicine

## 2011-01-16 ENCOUNTER — Ambulatory Visit: Payer: Self-pay | Admitting: Internal Medicine

## 2011-03-23 ENCOUNTER — Other Ambulatory Visit: Payer: Self-pay | Admitting: Internal Medicine

## 2011-03-24 ENCOUNTER — Other Ambulatory Visit: Payer: Self-pay | Admitting: Internal Medicine

## 2011-04-05 ENCOUNTER — Other Ambulatory Visit: Payer: Self-pay | Admitting: Internal Medicine

## 2011-09-10 ENCOUNTER — Other Ambulatory Visit: Payer: Self-pay | Admitting: Internal Medicine

## 2011-09-23 ENCOUNTER — Other Ambulatory Visit (INDEPENDENT_AMBULATORY_CARE_PROVIDER_SITE_OTHER): Payer: BC Managed Care – PPO

## 2011-09-23 DIAGNOSIS — Z0000 Encounter for general adult medical examination without abnormal findings: Secondary | ICD-10-CM

## 2011-09-23 LAB — CBC WITH DIFFERENTIAL/PLATELET
Basophils Relative: 0.4 % (ref 0.0–3.0)
Hemoglobin: 15.5 g/dL (ref 13.0–17.0)
Lymphocytes Relative: 43.1 % (ref 12.0–46.0)
MCHC: 34.4 g/dL (ref 30.0–36.0)
Monocytes Relative: 9.2 % (ref 3.0–12.0)
Neutro Abs: 2.2 10*3/uL (ref 1.4–7.7)
RBC: 5.01 Mil/uL (ref 4.22–5.81)

## 2011-09-23 LAB — BASIC METABOLIC PANEL
CO2: 27 mEq/L (ref 19–32)
Calcium: 9.7 mg/dL (ref 8.4–10.5)
Potassium: 4.2 mEq/L (ref 3.5–5.1)
Sodium: 139 mEq/L (ref 135–145)

## 2011-09-23 LAB — LIPID PANEL
Cholesterol: 221 mg/dL — ABNORMAL HIGH (ref 0–200)
Total CHOL/HDL Ratio: 5
Triglycerides: 139 mg/dL (ref 0.0–149.0)
VLDL: 27.8 mg/dL (ref 0.0–40.0)

## 2011-09-23 LAB — POCT URINALYSIS DIPSTICK
Ketones, UA: NEGATIVE
Spec Grav, UA: >=1.03

## 2011-09-23 LAB — HEPATIC FUNCTION PANEL
AST: 24 U/L (ref 0–37)
Albumin: 4.3 g/dL (ref 3.5–5.2)
Alkaline Phosphatase: 72 U/L (ref 39–117)
Total Protein: 7.9 g/dL (ref 6.0–8.3)

## 2011-09-23 LAB — TSH: TSH: 1.27 u[IU]/mL (ref 0.35–5.50)

## 2011-09-30 ENCOUNTER — Encounter: Payer: Self-pay | Admitting: Internal Medicine

## 2011-09-30 ENCOUNTER — Ambulatory Visit (INDEPENDENT_AMBULATORY_CARE_PROVIDER_SITE_OTHER): Payer: BC Managed Care – PPO | Admitting: Internal Medicine

## 2011-09-30 VITALS — BP 130/90 | HR 80 | Temp 98.4°F | Resp 18 | Ht 67.5 in | Wt 268.0 lb

## 2011-09-30 DIAGNOSIS — I10 Essential (primary) hypertension: Secondary | ICD-10-CM

## 2011-09-30 DIAGNOSIS — N62 Hypertrophy of breast: Secondary | ICD-10-CM

## 2011-09-30 DIAGNOSIS — E669 Obesity, unspecified: Secondary | ICD-10-CM

## 2011-09-30 DIAGNOSIS — Z0000 Encounter for general adult medical examination without abnormal findings: Secondary | ICD-10-CM

## 2011-09-30 DIAGNOSIS — Z86010 Personal history of colonic polyps: Secondary | ICD-10-CM

## 2011-09-30 LAB — PROLACTIN: Prolactin: 4.8 ng/mL (ref 2.1–17.1)

## 2011-09-30 MED ORDER — METOPROLOL SUCCINATE ER 100 MG PO TB24: 100.0000 mg | ORAL_TABLET | Freq: Every day | ORAL | Status: DC

## 2011-09-30 MED ORDER — CLONIDINE HCL 0.1 MG PO TABS: 0.1000 mg | ORAL_TABLET | ORAL | Status: DC

## 2011-09-30 MED ORDER — BENAZEPRIL HCL 40 MG PO TABS: 40.0000 mg | ORAL_TABLET | Freq: Every day | ORAL | Status: DC

## 2011-09-30 MED ORDER — LANSOPRAZOLE 30 MG PO CPDR: 30.0000 mg | DELAYED_RELEASE_CAPSULE | Freq: Every day | ORAL | Status: DC

## 2011-09-30 MED ORDER — AMLODIPINE BESYLATE 10 MG PO TABS: 10.0000 mg | ORAL_TABLET | Freq: Every day | ORAL | Status: DC

## 2011-09-30 NOTE — Patient Instructions (Signed)
Limit your sodium (Salt) intake    It is important that you exercise regularly, at least 20 minutes 3 to 4 times per week.  If you develop chest pain or shortness of breath seek  medical attention.  You need to lose weight.  Consider a lower calorie diet and regular exercise.  Return in 3 months for follow-up  

## 2011-09-30 NOTE — Progress Notes (Signed)
  Subjective:    Patient ID: Robert Mcguire, male    DOB: 05-Mar-1961, 51 y.o.   MRN: [id number]  HPI  51 year old patient who is seen today for a wellness exam. Medical problems include obesity he has had borderline low testosterone levels and at one point was given a trial of AndroGel. He has a 3-4 year history of gynecomastia and he felt that the AndroGel may have made the gynecomastia worse. He really does not have any significant ED or libido issues and this was not really helped by AndroGel. A followup testosterone level was slightly abnormal. He has treated hypertension on multiple drugs. He has had diuretic associated hypokalemia. Colonoscopy last year revealed colonic polyps.     Review of Systems  Constitutional: Negative for fever, chills, activity change, appetite change and fatigue.  HENT: Negative for hearing loss, ear pain, congestion, rhinorrhea, sneezing, mouth sores, trouble swallowing, neck pain, neck stiffness, dental problem, voice change, sinus pressure and tinnitus.   Eyes: Negative for photophobia, pain, redness and visual disturbance.  Respiratory: Negative for apnea, cough, choking, chest tightness, shortness of breath and wheezing.   Cardiovascular: Negative for chest pain, palpitations and leg swelling.  Gastrointestinal: Negative for nausea, vomiting, abdominal pain, diarrhea, constipation, blood in stool, abdominal distention, anal bleeding and rectal pain.  Genitourinary: Negative for dysuria, urgency, frequency, hematuria, flank pain, decreased urine volume, discharge, penile swelling, scrotal swelling, difficulty urinating, genital sores and testicular pain.  Musculoskeletal: Negative for myalgias, back pain, joint swelling, arthralgias and gait problem.  Skin: Negative for color change, rash and wound.  Neurological: Negative for dizziness, tremors, seizures, syncope, facial asymmetry, speech difficulty, weakness, light-headedness, numbness and headaches.    Hematological: Negative for adenopathy. Does not bruise/bleed easily.  Psychiatric/Behavioral: Negative for suicidal ideas, hallucinations, behavioral problems, confusion, sleep disturbance, self-injury, dysphoric mood, decreased concentration and agitation. The patient is not nervous/anxious.        Objective:   Physical Exam  Constitutional: He appears well-developed and well-nourished.  HENT:  Head: Normocephalic and atraumatic.  Right Ear: External ear normal.  Left Ear: External ear normal.  Nose: Nose normal.  Mouth/Throat: Oropharynx is clear and moist.  Eyes: Conjunctivae and EOM are normal. Pupils are equal, round, and reactive to light. No scleral icterus.  Neck: Normal range of motion. Neck supple. No JVD present. No thyromegaly present.  Cardiovascular: Regular rhythm, normal heart sounds and intact distal pulses.  Exam reveals no gallop and no friction rub.   No murmur heard. Pulmonary/Chest: Effort normal and breath sounds normal. He exhibits no tenderness.       Bilateral gynecomastia  Abdominal: Soft. Bowel sounds are normal. He exhibits no distension and no mass. There is no tenderness.  Genitourinary: Prostate normal and penis normal.       Testicles borderline small  Musculoskeletal: Normal range of motion. He exhibits no edema and no tenderness.  Lymphadenopathy:    He has no cervical adenopathy.  Neurological: He is alert. He has normal reflexes. No cranial nerve deficit. Coordination normal.  Skin: Skin is warm and dry. No rash noted.  Psychiatric: He has a normal mood and affect. His behavior is normal.          Assessment & Plan:  Preventive health examination Hypertension Exogenous obesity. Gynecomastia probably secondary to exogenous obesity. Will check a prolactin level  Weight loss encouraged Low-salt diet recommended Exercise encouraged

## 2011-10-06 ENCOUNTER — Telehealth: Payer: Self-pay | Admitting: *Deleted

## 2011-10-06 NOTE — Telephone Encounter (Signed)
Spoke with pt - informed lab normal

## 2011-10-06 NOTE — Telephone Encounter (Signed)
Pt would like to have his Prolactin results, please.

## 2011-10-13 ENCOUNTER — Telehealth: Payer: Self-pay | Admitting: Internal Medicine

## 2011-10-13 DIAGNOSIS — N62 Hypertrophy of breast: Secondary | ICD-10-CM

## 2011-10-13 NOTE — Telephone Encounter (Signed)
Referral done - please sent last rov and lab-prolactin level

## 2011-10-13 NOTE — Telephone Encounter (Signed)
Pt would like a referral to endocrinologist for hypertrophy of breast

## 2011-10-13 NOTE — Telephone Encounter (Signed)
Ok Please send copy of last ROV note and prolactin level

## 2011-10-13 NOTE — Telephone Encounter (Signed)
Please advise 

## 2012-02-06 ENCOUNTER — Ambulatory Visit (INDEPENDENT_AMBULATORY_CARE_PROVIDER_SITE_OTHER): Payer: BC Managed Care – PPO | Admitting: Internal Medicine

## 2012-02-06 ENCOUNTER — Encounter: Payer: Self-pay | Admitting: Internal Medicine

## 2012-02-06 VITALS — BP 122/80 | Temp 98.6°F | Wt 276.0 lb

## 2012-02-06 DIAGNOSIS — I10 Essential (primary) hypertension: Secondary | ICD-10-CM

## 2012-02-06 DIAGNOSIS — G4733 Obstructive sleep apnea (adult) (pediatric): Secondary | ICD-10-CM

## 2012-02-06 DIAGNOSIS — Z8639 Personal history of other endocrine, nutritional and metabolic disease: Secondary | ICD-10-CM

## 2012-02-06 DIAGNOSIS — N62 Hypertrophy of breast: Secondary | ICD-10-CM

## 2012-02-06 NOTE — Patient Instructions (Signed)
Home sleep study as discussed  Limit your sodium (Salt) intake    It is important that you exercise regularly, at least 20 minutes 3 to 4 times per week.  If you develop chest pain or shortness of breath seek  medical attention.  You need to lose weight.  Consider a lower calorie diet and regular exercise.  Please check your blood pressure on a regular basis.  If it is consistently greater than 150/90, plea inse make an office appointment.  Return in 3 months for follow-up

## 2012-02-06 NOTE — Progress Notes (Signed)
  Subjective:    Patient ID: Robert Mcguire, male    DOB: 1961/01/20, 51 y.o.   MRN: [id number]  HPI  51 year old patient who is seen today for followup of his hypertension. Catapres has been tapered and discontinued by endocrinology D2 a possible association with gynecomastia. He has been off this medication for about 4 weeks and feels well. Remains on triple therapy. He has a history of hypokalemia and medical regimen does not include diuretic therapy. He feels well today.  The patient does have a history of loud snoring and intermittent daytime sleepiness.    Review of Systems  Constitutional: Negative for fever, chills, appetite change and fatigue.  HENT: Negative for hearing loss, ear pain, congestion, sore throat, trouble swallowing, neck stiffness, dental problem, voice change and tinnitus.   Eyes: Negative for pain, discharge and visual disturbance.  Respiratory: Negative for cough, chest tightness, wheezing and stridor.   Cardiovascular: Negative for chest pain, palpitations and leg swelling.  Gastrointestinal: Negative for nausea, vomiting, abdominal pain, diarrhea, constipation, blood in stool and abdominal distention.  Genitourinary: Negative for urgency, hematuria, flank pain, discharge, difficulty urinating and genital sores.  Musculoskeletal: Negative for myalgias, back pain, joint swelling, arthralgias and gait problem.  Skin: Negative for rash.  Neurological: Negative for dizziness, syncope, speech difficulty, weakness, numbness and headaches.  Hematological: Negative for adenopathy. Does not bruise/bleed easily.  Psychiatric/Behavioral: Positive for disturbed wake/sleep cycle. Negative for behavioral problems and dysphoric mood. The patient is not nervous/anxious.        Objective:   Physical Exam  Constitutional: He is oriented to person, place, and time. He appears well-developed.  HENT:  Head: Normocephalic.  Right Ear: External ear normal.  Left Ear: External ear  normal.       Low hanging soft palate and extreme pharyngeal crowding  Eyes: Conjunctivae and EOM are normal.  Neck: Normal range of motion.  Cardiovascular: Normal rate and normal heart sounds.   Pulmonary/Chest: Breath sounds normal.  Abdominal: Bowel sounds are normal.  Musculoskeletal: Normal range of motion. He exhibits no edema and no tenderness.  Neurological: He is alert and oriented to person, place, and time.  Psychiatric: He has a normal mood and affect. His behavior is normal.          Assessment & Plan:   Hypertension. Well controlled on Catapres. We'll continue present regimen Rule out OSA  Weight loss encouraged Home sleep study

## 2012-02-13 ENCOUNTER — Other Ambulatory Visit: Payer: Self-pay | Admitting: Internal Medicine

## 2012-02-13 NOTE — Telephone Encounter (Addendum)
Pt needs new rx sent to [facility] androgel

## 2012-02-16 ENCOUNTER — Other Ambulatory Visit: Payer: Self-pay | Admitting: Internal Medicine

## 2012-02-16 DIAGNOSIS — G4733 Obstructive sleep apnea (adult) (pediatric): Secondary | ICD-10-CM

## 2012-02-16 NOTE — Telephone Encounter (Signed)
Please advise- this was dc'd at rov in march

## 2012-02-22 NOTE — Telephone Encounter (Signed)
Ok to RF? 

## 2012-02-23 MED ORDER — TESTOSTERONE 50 MG/5GM (1%) TD GEL: 5.0000 g | Freq: Every day | TRANSDERMAL | Status: DC

## 2012-02-23 NOTE — Telephone Encounter (Signed)
Faxed

## 2012-03-10 ENCOUNTER — Ambulatory Visit (HOSPITAL_BASED_OUTPATIENT_CLINIC_OR_DEPARTMENT_OTHER): Payer: BC Managed Care – PPO | Attending: Internal Medicine | Admitting: Radiology

## 2012-03-10 VITALS — Ht 67.0 in | Wt 278.0 lb

## 2012-03-10 DIAGNOSIS — G4710 Hypersomnia, unspecified: Secondary | ICD-10-CM | POA: Insufficient documentation

## 2012-03-10 DIAGNOSIS — G4730 Sleep apnea, unspecified: Secondary | ICD-10-CM

## 2012-03-10 DIAGNOSIS — G4733 Obstructive sleep apnea (adult) (pediatric): Secondary | ICD-10-CM

## 2012-03-19 DIAGNOSIS — G4710 Hypersomnia, unspecified: Secondary | ICD-10-CM

## 2012-03-19 DIAGNOSIS — G4730 Sleep apnea, unspecified: Secondary | ICD-10-CM

## 2012-03-20 NOTE — Procedures (Signed)
NAME:  Robert Mcguire, Robert Mcguire NO.:  [id number]  MEDICAL RECORD NO.:  [id number]          PATIENT TYPE:  OUT  LOCATION:  SLEEP CENTER                 FACILITY:  [facility]  PHYSICIAN:  Barbaraann Share, MD,FCCPDATE OF BIRTH:  26-[birth year]-1959  DATE OF STUDY:  03/10/2012                           NOCTURNAL POLYSOMNOGRAM  REFERRING PHYSICIAN:  Gordy Savers, MD  INDICATION FOR STUDY:  Hypersomnia with sleep apnea.  EPWORTH SLEEPINESS SCORE:  12.  MEDICATIONS:  SLEEP ARCHITECTURE:  The patient had a total sleep time of 342 minutes with no slow-wave sleep and only 43 minutes of REM.  Sleep onset latency was normal at 6.5 minutes and REM was prolonged at 267 minutes.  Sleep efficiency was mildly reduced at 85%.  RESPIRATORY DATA:  The patient was found to have 9 obstructive and central apneas and 16 obstructive hypopneas, giving him an apnea- hypopnea index of only 4 events per hour.  He was also noted to have moderate numbers of airflow reduction that did not meet the formal criteria for an apnea or hypopnea, however, did result in an arousal. He did not meet split night criteria secondary to the small numbers of events.  He was also noted to have moderate-to-loud snoring throughout, and his events occurred in all body positions.  OXYGEN DATA:  There was O2 desaturation as low as 89% with the patient's obstructive events.  CARDIAC DATA:  Occasional PAC and PVC noted.  MOVEMENT-PARASOMNIA:  The patient had no significant leg jerks or other abnormal behaviors noted.  IMPRESSIONS-RECOMMENDATIONS: 1. Small numbers of obstructive events, which do not meet the AHI     criteria for the obstructive sleep apnea syndrome.  He did have     moderate numbers of airflow reduction that resulted in arousals,     and probably represents the upper airway resistance syndrome.     Treatment for this should focus on significant weight     reduction, as well as positional therapy. 2.  Occasional PAC and PVC noted, with no clinically significant     arrhythmia seen.     Barbaraann Share, MD,FCCP Diplomate, American Board of Sleep Medicine    [facility]/MEDQ  D:  03/19/2012 08:40:11  T:  03/20/2012 03:45:50  Job:  [id number]

## 2012-10-17 ENCOUNTER — Other Ambulatory Visit: Payer: Self-pay | Admitting: Internal Medicine

## 2012-10-28 ENCOUNTER — Encounter: Payer: Self-pay | Admitting: Internal Medicine

## 2012-10-28 ENCOUNTER — Ambulatory Visit (INDEPENDENT_AMBULATORY_CARE_PROVIDER_SITE_OTHER): Payer: BC Managed Care – PPO | Admitting: Internal Medicine

## 2012-10-28 VITALS — BP 130/90 | HR 79 | Temp 98.5°F | Resp 20 | Wt 289.0 lb

## 2012-10-28 DIAGNOSIS — I10 Essential (primary) hypertension: Secondary | ICD-10-CM

## 2012-10-28 DIAGNOSIS — E669 Obesity, unspecified: Secondary | ICD-10-CM

## 2012-10-28 DIAGNOSIS — Z87442 Personal history of urinary calculi: Secondary | ICD-10-CM

## 2012-10-28 DIAGNOSIS — R300 Dysuria: Secondary | ICD-10-CM

## 2012-10-28 DIAGNOSIS — R350 Frequency of micturition: Secondary | ICD-10-CM

## 2012-10-28 LAB — POCT URINALYSIS DIPSTICK
Glucose, UA: NEGATIVE
Nitrite, UA: NEGATIVE
Urobilinogen, UA: 0.2

## 2012-10-28 NOTE — Progress Notes (Signed)
  Subjective:    Patient ID: Robert Mcguire, male    DOB: 12/18/1960, 52 y.o.   MRN: [id number]  HPI   52 year old patient who has a history of treated hypertension and also a history of nephrolithiasis. He presents with a one-day history of burning dysuria and urinary frequency. No fever chills or flank pain. No prior history of UTIs.  Past Medical History  Diagnosis Date  . GERD (gastroesophageal reflux disease)   . Hypertension   . Allergic rhinitis   . Nephrolithiasis   . Obesity   . ED (erectile dysfunction)   . Impaired glucose tolerance     History   Social History  . Marital Status: Married    Spouse Name: N/A    Number of Children: N/A  . Years of Education: N/A   Occupational History  . Not on file.   Social History Main Topics  . Smoking status: Never Smoker   . Smokeless tobacco: Never Used  . Alcohol Use: Yes  . Drug Use: No  . Sexually Active: Not on file   Other Topics Concern  . Not on file   Social History Narrative  . No narrative on file    Past Surgical History  Procedure Laterality Date  . Right achilles tendon surgery      Family History  Problem Relation Age of Onset  . Hypertension Mother   . Obesity Father   . Diabetes Father   . Hypertension Father   . Healthy Sister   . Hypertension Brother   . Hypertension Maternal Grandmother   . Diabetes Other   . Hypertension Other   . Cancer Other     breast, colon   . Hypertension Brother     No Known Allergies  Current Outpatient Prescriptions on File Prior to Visit  Medication Sig Dispense Refill  . amLODipine (NORVASC) 10 MG tablet Take 1 tablet (10 mg total) by mouth daily.  90 tablet  6  . benazepril (LOTENSIN) 40 MG tablet Take 1 tablet (40 mg total) by mouth daily.  90 tablet  6  . lansoprazole (PREVACID) 30 MG capsule Take 1 capsule (30 mg total) by mouth daily.  90 capsule  6  . metoprolol succinate (TOPROL-XL) 100 MG 24 hr tablet TAKE 1 TABLET DAILY WITH OR IMMEDIATELY  FOLLOWING A MEAL  90 tablet  1  . vardenafil (LEVITRA) 10 MG tablet Take 10 mg by mouth daily as needed.       No current facility-administered medications on file prior to visit.    BP 130/90  Pulse 79  Temp(Src) 98.5 F (36.9 C) (Oral)  Resp 20  Wt 289 lb (131.09 kg)  BMI 45.25 kg/m2  SpO2 98%       Review of Systems  Genitourinary: Positive for dysuria, urgency and frequency. Negative for flank pain, discharge, penile swelling, scrotal swelling, penile pain and testicular pain.       Objective:   Physical Exam  Constitutional: He appears well-developed and well-nourished. No distress.          Assessment & Plan:   Urinary tract infection History of nephrolithiasis  Will treat with Cipro 500 twice a day for 7 days. Will call if unimproved. We'll consider further urological evaluation if symptoms recur.

## 2012-10-29 ENCOUNTER — Encounter: Payer: Self-pay | Admitting: Internal Medicine

## 2012-10-29 MED ORDER — CIPROFLOXACIN HCL 500 MG PO TABS: 500.0000 mg | ORAL_TABLET | Freq: Two times a day (BID) | ORAL | Status: DC

## 2012-12-17 ENCOUNTER — Other Ambulatory Visit: Payer: Self-pay | Admitting: Internal Medicine

## 2013-01-27 ENCOUNTER — Ambulatory Visit (INDEPENDENT_AMBULATORY_CARE_PROVIDER_SITE_OTHER): Payer: BC Managed Care – PPO | Admitting: Family Medicine

## 2013-01-27 VITALS — BP 158/84 | Temp 98.7°F | Wt 286.0 lb

## 2013-01-27 DIAGNOSIS — M79672 Pain in left foot: Secondary | ICD-10-CM

## 2013-01-27 DIAGNOSIS — M79609 Pain in unspecified limb: Secondary | ICD-10-CM

## 2013-01-27 NOTE — Patient Instructions (Addendum)
-  lace shoes to relieve pressure in this point  -ice 10-15 minutes twice daily (towel around bag of frozen peas or ice); in a few days switch to heat  -ibuprofen 400mg  or tylenol 500-1000mg  2-3 times dialy if needed for pain  -decrease walking for next few days to 1 week until feeling a better  -follow up in 4 weeks

## 2013-01-27 NOTE — Progress Notes (Signed)
Chief Complaint  Patient presents with  . left foot pain    HPI:  Acute visit for L foot pain: -started a few days ago -L foot pain started the next day after walking around neighborhood - no trauma -pain is intermittent - only hurts when moves it certain ways and with walking on it -has been wearing a different pair of shoes that he doesn't usually wear -ibuprofen helped some -denies: fevers, chills, weakness, severe pain ROS: See pertinent positives and negatives per HPI.  Past Medical History  Diagnosis Date  . GERD (gastroesophageal reflux disease)   . Hypertension   . Allergic rhinitis   . Nephrolithiasis   . Obesity   . ED (erectile dysfunction)   . Impaired glucose tolerance     Family History  Problem Relation Age of Onset  . Hypertension Mother   . Obesity Father   . Diabetes Father   . Hypertension Father   . Healthy Sister   . Hypertension Brother   . Hypertension Maternal Grandmother   . Diabetes Other   . Hypertension Other   . Cancer Other     breast, colon   . Hypertension Brother     History   Social History  . Marital Status: Married    Spouse Name: N/A    Number of Children: N/A  . Years of Education: N/A   Social History Main Topics  . Smoking status: Never Smoker   . Smokeless tobacco: Never Used  . Alcohol Use: Yes  . Drug Use: No  . Sexually Active: Not on file   Other Topics Concern  . Not on file   Social History Narrative  . No narrative on file    Current outpatient prescriptions:amLODipine (NORVASC) 10 MG tablet, Take 1 tablet (10 mg total) by mouth daily., Disp: 90 tablet, Rfl: 3;  benazepril (LOTENSIN) 40 MG tablet, Take 1 tablet (40 mg total) by mouth daily., Disp: 90 tablet, Rfl: 3;  lansoprazole (PREVACID) 30 MG capsule, Take 1 capsule (30 mg total) by mouth daily., Disp: 90 capsule, Rfl: 6 metoprolol succinate (TOPROL-XL) 100 MG 24 hr tablet, TAKE 1 TABLET DAILY WITH OR IMMEDIATELY FOLLOWING A MEAL, Disp: 90 tablet,  Rfl: 1;  vardenafil (LEVITRA) 10 MG tablet, Take 10 mg by mouth daily as needed., Disp: , Rfl: ;  ciprofloxacin (CIPRO) 500 MG tablet, Take 1 tablet (500 mg total) by mouth 2 (two) times daily., Disp: 14 tablet, Rfl: 0  EXAM:  Filed Vitals:   01/27/13 1354  BP: 158/84  Temp: 98.7 F (37.1 C)    Body mass index is 44.78 kg/(m^2).  GENERAL: vitals reviewed and listed above, alert, oriented, appears well hydrated and in no acute distress  HEENT: atraumatic, conjunttiva clear, no obvious abnormalities on inspection of external nose and ears  NECK: no obvious masses on inspection  MS: moves all extremities without noticeable abnormality -normal gait -normal appearance of both feet without swelling or sig deformity -TTP Dorsal medial foot over hal ext tendons, no bony TTP, neg drawer test, some pain with ext great toe against resistance    PSYCH: pleasant and cooperative, no obvious depression or anxiety  ASSESSMENT AND PLAN:  Discussed the following assessment and plan:  Foot pain, left  -suspect soft tissue injury from increased walking and pressure point from shoes -  replaced shoe laces to relieve pressure point and this helped, conservative tx per instructions -follow up in 4 weeks or sooner if needed -Patient advised to return or notify  a doctor immediately if symptoms worsen or persist or new concerns arise.  Patient Instructions  -lace shoes to relieve pressure in this point  -ice 10-15 minutes twice daily (towel around bag of frozen peas or ice); in a few days switch to heat  -ibuprofen 400mg  or tylenol 500-1000mg  2-3 times dialy if needed for pain  -decrease walking for next few days to 1 week until feeling a better  -follow up in 4 weeks     KIM, HANNAH R.

## 2013-02-22 ENCOUNTER — Other Ambulatory Visit: Payer: Self-pay | Admitting: *Deleted

## 2013-02-22 MED ORDER — LANSOPRAZOLE 30 MG PO CPDR: 30.0000 mg | DELAYED_RELEASE_CAPSULE | Freq: Every day | ORAL | Status: DC

## 2013-07-28 ENCOUNTER — Other Ambulatory Visit: Payer: Self-pay | Admitting: Internal Medicine

## 2013-10-24 ENCOUNTER — Ambulatory Visit: Payer: BC Managed Care – PPO | Admitting: Family Medicine

## 2013-10-24 ENCOUNTER — Encounter: Payer: Self-pay | Admitting: Physician Assistant

## 2013-10-24 ENCOUNTER — Ambulatory Visit (INDEPENDENT_AMBULATORY_CARE_PROVIDER_SITE_OTHER): Payer: BC Managed Care – PPO | Admitting: Physician Assistant

## 2013-10-24 VITALS — BP 164/90 | HR 99 | Temp 98.5°F | Resp 18 | Ht 67.5 in | Wt 280.0 lb

## 2013-10-24 DIAGNOSIS — R319 Hematuria, unspecified: Secondary | ICD-10-CM

## 2013-10-24 DIAGNOSIS — IMO0001 Reserved for inherently not codable concepts without codable children: Secondary | ICD-10-CM

## 2013-10-24 DIAGNOSIS — R300 Dysuria: Secondary | ICD-10-CM

## 2013-10-24 DIAGNOSIS — R350 Frequency of micturition: Secondary | ICD-10-CM

## 2013-10-24 LAB — POCT URINALYSIS DIPSTICK
BILIRUBIN UA: NEGATIVE
Ketones, UA: NEGATIVE
Leukocytes, UA: NEGATIVE
NITRITE UA: NEGATIVE
Spec Grav, UA: 1.02
Urobilinogen, UA: 0.2
pH, UA: 5.5

## 2013-10-24 NOTE — Patient Instructions (Addendum)
Ambulatory referral to urology due to hematuria and lack of infection on urine dipstick. Scheduled for 2:30 today.  Followup if new concerns develop.  Schedule appointment for yearly physical.  Hematuria, Adult Hematuria is blood in your urine. It can be caused by a bladder infection, kidney infection, prostate infection, kidney stone, or cancer of your urinary tract. Infections can usually be treated with medicine, and a kidney stone usually will pass through your urine. If neither of these is the cause of your hematuria, further workup to find out the reason may be needed. It is very important that you tell your health care provider about any blood you see in your urine, even if the blood stops without treatment or happens without causing pain. Blood in your urine that happens and then stops and then happens again can be a symptom of a very serious condition. Also, pain is not a symptom in the initial stages of many urinary cancers. HOME CARE INSTRUCTIONS   Drink lots of fluid, 3 4 quarts a day. If you have been diagnosed with an infection, cranberry juice is especially recommended, in addition to large amounts of water.  Avoid caffeine, tea, and carbonated beverages, because they tend to irritate the bladder.  Avoid alcohol because it may irritate the prostate.  Only take over-the-counter or prescription medicines for pain, discomfort, or fever as directed by your health care provider.  If you have been diagnosed with a kidney stone, follow your health care provider's instructions regarding straining your urine to catch the stone.  Empty your bladder often. Avoid holding urine for long periods of time.  After a bowel movement, women should cleanse front to back. Use each tissue only once.  Empty your bladder before and after sexual intercourse if you are a male. SEEK MEDICAL CARE IF: You develop back pain, fever, a feeling of sickness in your stomach (nausea), or vomiting or if your  symptoms are not better in 3 days. Return sooner if you are getting worse. SEEK IMMEDIATE MEDICAL CARE IF:   You have a persistent fever, with a temperature of 101.21F (38.8C) or greater.  You develop severe vomiting and are unable to keep the medicine down.  You develop severe back or abdominal pain despite taking your medicines.  You begin passing a large amount of blood or clots in your urine.  You feel extremely weak or faint, or you pass out. MAKE SURE YOU:   Understand these instructions.  Will watch your condition.  Will get help right away if you are not doing well or get worse. Document Released: 06/23/2005 Document Revised: 04/13/2013 Document Reviewed: 02/21/2013 [facility] Patient Information 2014 [place].

## 2013-10-24 NOTE — Progress Notes (Signed)
Pre visit review using our clinic review tool, if applicable. No additional management support is needed unless otherwise documented below in the visit note. 

## 2013-10-24 NOTE — Progress Notes (Signed)
Subjective:    Patient ID: Robert Mcguire, male    DOB: [birth year], 53 y.o.   MRN: [id number]  Hematuria   Patient is a 53 year old [place] American male with a history of UTI and nephrolithiasis presenting to the clinic today for hematuria. Patient states that he may have gotten dehydrated over the weekend, and noticed frequent urination with a distinct red color, which he attributes to blood.  He also has associated symptoms of urgency and sensation of incomplete bladder emptying, and lack of appetite for which he is been utilizing the [employer].  He denies flank pain, dysuria, fevers, chills, nausea, vomiting, shortness of breath.   Review of Systems  Genitourinary: Positive for hematuria.   All others as per the history of present illness and are otherwise negative.    Past Medical History  Diagnosis Date  . GERD (gastroesophageal reflux disease)   . Hypertension   . Allergic rhinitis   . Nephrolithiasis   . Obesity   . ED (erectile dysfunction)   . Impaired glucose tolerance    Past Surgical History  Procedure Laterality Date  . Right achilles tendon surgery      reports that he has never smoked. He has never used smokeless tobacco. He reports that he drinks alcohol. He reports that he does not use illicit drugs. family history includes Cancer in his other; Diabetes in his father and other; Healthy in his sister; Hypertension in his brother, brother, father, maternal grandmother, mother, and other; Obesity in his father. No Known Allergies   Objective:   Physical Exam  Vitals reviewed. Constitutional: He is oriented to person, place, and time. He appears well-developed and well-nourished. No distress.  HENT:  Head: Normocephalic and atraumatic.  Eyes: EOM are normal. Pupils are equal, round, and reactive to light.  Neck: Normal range of motion.  Cardiovascular: Normal rate, regular rhythm, normal heart sounds and intact distal pulses.  Exam reveals no gallop and no  friction rub.   No murmur heard. Pulmonary/Chest: Effort normal and breath sounds normal. No respiratory distress. He has no wheezes. He has no rales. He exhibits no tenderness.  Abdominal: Soft. Bowel sounds are normal. There is no tenderness.  Musculoskeletal: Normal range of motion.  Neurological: He is alert and oriented to person, place, and time. Coordination normal.  Skin: Skin is warm and dry. No rash noted. He is not diaphoretic. No erythema. No pallor.  Psychiatric: He has a normal mood and affect. His behavior is normal. Judgment and thought content normal.   Filed Vitals:   10/24/13 1120  BP: 164/90  Pulse: 99  Temp: 98.5 F (36.9 C)  Resp: 18     Lab Results  Component Value Date   WBC 4.8 09/23/2011   HGB 15.5 09/23/2011   HCT 45.0 09/23/2011   PLT 185.0 09/23/2011   GLUCOSE 103* 09/23/2011   CHOL 221* 09/23/2011   TRIG 139.0 09/23/2011   HDL 42.30 09/23/2011   LDLDIRECT 156.2 09/23/2011   LDLCALC 124* 04/26/2008   ALT 27 09/23/2011   AST 24 09/23/2011   NA 139 09/23/2011   K 4.2 09/23/2011   CL 101 09/23/2011   CREATININE 0.8 09/23/2011   BUN 13 09/23/2011   CO2 27 09/23/2011   TSH 1.27 09/23/2011   PSA 0.74 09/23/2011   Urinalysis    Component Value Date/Time   COLORURINE yellow 07/26/2010 0850   APPEARANCEUR Clear 07/26/2010 0850   LABSPEC >=1.030 07/26/2010 0850   PHURINE 5.5 07/26/2010 0850  HGBUR negative 07/26/2010 0850   BILIRUBINUR n 10/24/2013 1128   BILIRUBINUR negative 07/26/2010 0850   PROTEINUR 3+ 10/24/2013 1128   UROBILINOGEN 0.2 10/24/2013 1128   UROBILINOGEN 0.2 07/26/2010 0850   NITRITE n 10/24/2013 1128   NITRITE negative 07/26/2010 0850   LEUKOCYTESUR Negative 10/24/2013 1128        Assessment & Plan:  Robert Mcguire was seen today for hematuria.  Diagnoses and associated orders for this visit:  Hematuria - POCT Urinalysis Dipstick - Ambulatory referral to Urology  Frequency - POCT Urinalysis Dipstick - Ambulatory referral to Urology    POCT  Urinalysis results above.  Patient Instructions  Ambulatory referral to urology due to hematuria and lack of infection on urine dipstick. Scheduled for 2:30 today.  Followup if new concerns develop.  Schedule appointment for yearly physical.

## 2013-11-11 ENCOUNTER — Encounter: Payer: Self-pay | Admitting: Internal Medicine

## 2013-11-11 ENCOUNTER — Ambulatory Visit (INDEPENDENT_AMBULATORY_CARE_PROVIDER_SITE_OTHER): Payer: BC Managed Care – PPO | Admitting: Internal Medicine

## 2013-11-11 VITALS — BP 130/86 | HR 75 | Temp 98.5°F | Resp 20 | Ht 67.5 in | Wt 288.0 lb

## 2013-11-11 DIAGNOSIS — S93409A Sprain of unspecified ligament of unspecified ankle, initial encounter: Secondary | ICD-10-CM

## 2013-11-11 NOTE — Progress Notes (Signed)
   Subjective:    Patient ID: Robert Mcguire, male    DOB: [birth year], 53 y.o.   MRN: [id number]  HPI  53 year old patient, who presents with a two-day history of pain involving the right distal lateral lower leg and ankle area.  He has had a prior history of Achilles tendinitis in the past.  Pain is aggravated by standing and totally relieved by rest.  No history of trauma  Past Medical History  Diagnosis Date  . GERD (gastroesophageal reflux disease)   . Hypertension   . Allergic rhinitis   . Nephrolithiasis   . Obesity   . ED (erectile dysfunction)   . Impaired glucose tolerance     History   Social History  . Marital Status: Married    Spouse Name: N/A    Number of Children: N/A  . Years of Education: N/A   Occupational History  . Not on file.   Social History Main Topics  . Smoking status: Never Smoker   . Smokeless tobacco: Never Used  . Alcohol Use: Yes  . Drug Use: No  . Sexual Activity: Not on file   Other Topics Concern  . Not on file   Social History Narrative  . No narrative on file    Past Surgical History  Procedure Laterality Date  . Right achilles tendon surgery      Family History  Problem Relation Age of Onset  . Hypertension Mother   . Obesity Father   . Diabetes Father   . Hypertension Father   . Healthy Sister   . Hypertension Brother   . Hypertension Maternal Grandmother   . Diabetes Other   . Hypertension Other   . Cancer Other     breast, colon   . Hypertension Brother     No Known Allergies  Current Outpatient Prescriptions on File Prior to Visit  Medication Sig Dispense Refill  . amLODipine (NORVASC) 10 MG tablet Take 1 tablet (10 mg total) by mouth daily.  90 tablet  3  . benazepril (LOTENSIN) 40 MG tablet Take 1 tablet (40 mg total) by mouth daily.  90 tablet  3  . lansoprazole (PREVACID) 30 MG capsule Take 1 capsule (30 mg total) by mouth daily.  90 capsule  3  . metoprolol succinate (TOPROL-XL) 100 MG 24 hr tablet TAKE 1  TABLET DAILY WITH OR IMMEDIATELY FOLLOWING A MEAL  90 tablet  1  . vardenafil (LEVITRA) 10 MG tablet Take 10 mg by mouth daily as needed.       No current facility-administered medications on file prior to visit.    BP 130/86  Pulse 75  Temp(Src) 98.5 F (36.9 C) (Oral)  Resp 20  Ht 5' 7.5" (1.715 m)  Wt 288 lb (130.636 kg)  BMI 44.42 kg/m2  SpO2 98%       Review of Systems  Musculoskeletal: Positive for arthralgias and gait problem.       Objective:   Physical Exam  Constitutional: He appears well-developed and well-nourished. No distress.  Overweight.  Blood pressure 130/80  Musculoskeletal:  No soft tissue swelling Internal rotation of the right foot did tend to aggravate the discomfort          Assessment & Plan:   Right ankle tendinitis.  Will chew with rest, anti-inflammatories  Proper foot care discussed  Patient has a complete physical scheduled in one, month.  We'll reassess at that time or as needed

## 2013-11-11 NOTE — Progress Notes (Signed)
Pre-visit discussion using our clinic review tool. No additional management support is needed unless otherwise documented below in the visit note.  

## 2013-11-11 NOTE — Patient Instructions (Signed)
Tendinitis Tendinitis is swelling and inflammation of the tendons. Tendons are band-like tissues that connect muscle to bone. Tendinitis commonly occurs in the:   Shoulders (rotator cuff).  Heels (Achilles tendon).  Elbows (triceps tendon). CAUSES Tendinitis is usually caused by overusing the tendon, muscles, and joints involved. When the tissue surrounding a tendon (synovium) becomes inflamed, it is called tenosynovitis. Tendinitis commonly develops in people whose jobs require repetitive motions. SYMPTOMS  Pain.  Tenderness.  Mild swelling. DIAGNOSIS Tendinitis is usually diagnosed by physical exam. Your caregiver may also order X-rays or other imaging tests. TREATMENT Your caregiver may recommend certain medicines or exercises for your treatment. HOME CARE INSTRUCTIONS   Use a sling or splint for as long as directed by your caregiver until the pain decreases.  Put ice on the injured area.  Put ice in a plastic bag.  Place a towel between your skin and the bag.  Leave the ice on for 15-20 minutes, 03-04 times a day.  Avoid using the limb while the tendon is painful. Perform gentle range of motion exercises only as directed by your caregiver. Stop exercises if pain or discomfort increase, unless directed otherwise by your caregiver.  Only take over-the-counter or prescription medicines for pain, discomfort, or fever as directed by your caregiver. SEEK MEDICAL CARE IF:   Your pain and swelling increase.  You develop new, unexplained symptoms, especially increased numbness in the hands. MAKE SURE YOU:   Understand these instructions.  Will watch your condition.  Will get help right away if you are not doing well or get worse. Document Released: 06/20/2000 Document Revised: 09/15/2011 Document Reviewed: 09/09/2010 [facility] Patient Information 2014 [place], [place].   ZORVOLEX  1 twice daily

## 2013-11-18 ENCOUNTER — Other Ambulatory Visit: Payer: Self-pay | Admitting: Internal Medicine

## 2013-12-12 ENCOUNTER — Other Ambulatory Visit (INDEPENDENT_AMBULATORY_CARE_PROVIDER_SITE_OTHER): Payer: BC Managed Care – PPO

## 2013-12-12 DIAGNOSIS — Z0000 Encounter for general adult medical examination without abnormal findings: Secondary | ICD-10-CM

## 2013-12-12 LAB — LIPID PANEL
Cholesterol: 214 mg/dL — ABNORMAL HIGH (ref 0–200)
HDL: 37.1 mg/dL — AB (ref >39.00)
LDL Cholesterol: 144 mg/dL — ABNORMAL HIGH (ref 0–99)
NONHDL: 176.9
Total CHOL/HDL Ratio: 6
Triglycerides: 163 mg/dL — ABNORMAL HIGH (ref 0.0–149.0)
VLDL: 32.6 mg/dL (ref 0.0–40.0)

## 2013-12-12 LAB — POCT URINALYSIS DIPSTICK
Bilirubin, UA: NEGATIVE
Blood, UA: NEGATIVE
Glucose, UA: NEGATIVE
Ketones, UA: NEGATIVE
LEUKOCYTES UA: NEGATIVE
Nitrite, UA: NEGATIVE
Spec Grav, UA: >=1.03
Urobilinogen, UA: 0.2
pH, UA: 7

## 2013-12-12 LAB — CBC WITH DIFFERENTIAL/PLATELET
BASOS PCT: 0.5 % (ref 0.0–3.0)
Basophils Absolute: 0 10*3/uL (ref 0.0–0.1)
EOS ABS: 0.1 10*3/uL (ref 0.0–0.7)
EOS PCT: 1.4 % (ref 0.0–5.0)
HCT: 45.7 % (ref 39.0–52.0)
Hemoglobin: 15.5 g/dL (ref 13.0–17.0)
Lymphocytes Relative: 43.2 % (ref 12.0–46.0)
Lymphs Abs: 2.2 10*3/uL (ref 0.7–4.0)
MCHC: 34 g/dL (ref 30.0–36.0)
MCV: 90.2 fl (ref 78.0–100.0)
MONOS PCT: 8.5 % (ref 3.0–12.0)
Monocytes Absolute: 0.4 10*3/uL (ref 0.1–1.0)
NEUTROS PCT: 46.4 % (ref 43.0–77.0)
Neutro Abs: 2.3 10*3/uL (ref 1.4–7.7)
Platelets: 213 10*3/uL (ref 150.0–400.0)
RBC: 5.06 Mil/uL (ref 4.22–5.81)
RDW: 14.1 % (ref 11.5–15.5)
WBC: 5 10*3/uL (ref 4.0–10.5)

## 2013-12-12 LAB — BASIC METABOLIC PANEL
BUN: 14 mg/dL (ref 6–23)
CALCIUM: 9.6 mg/dL (ref 8.4–10.5)
CO2: 26 mEq/L (ref 19–32)
Chloride: 105 mEq/L (ref 96–112)
Creatinine, Ser: 0.7 mg/dL (ref 0.4–1.5)
GFR: 153.82 mL/min (ref >60.00)
Glucose, Bld: 133 mg/dL — ABNORMAL HIGH (ref 70–99)
Potassium: 4 mEq/L (ref 3.5–5.1)
SODIUM: 139 meq/L (ref 135–145)

## 2013-12-12 LAB — HEPATIC FUNCTION PANEL
ALT: 34 U/L (ref 0–53)
AST: 26 U/L (ref 0–37)
Albumin: 3.9 g/dL (ref 3.5–5.2)
Alkaline Phosphatase: 73 U/L (ref 39–117)
BILIRUBIN DIRECT: 0.2 mg/dL (ref 0.0–0.3)
Total Bilirubin: 1.1 mg/dL (ref 0.2–1.2)
Total Protein: 7.3 g/dL (ref 6.0–8.3)

## 2013-12-12 LAB — PSA: PSA: 0.68 ng/mL (ref 0.10–4.00)

## 2013-12-12 LAB — TSH: TSH: 1.19 u[IU]/mL (ref 0.35–4.50)

## 2013-12-15 ENCOUNTER — Other Ambulatory Visit: Payer: BC Managed Care – PPO

## 2013-12-23 ENCOUNTER — Ambulatory Visit (INDEPENDENT_AMBULATORY_CARE_PROVIDER_SITE_OTHER): Payer: BC Managed Care – PPO | Admitting: Internal Medicine

## 2013-12-23 ENCOUNTER — Encounter: Payer: Self-pay | Admitting: Internal Medicine

## 2013-12-23 VITALS — BP 132/80 | Temp 98.8°F | Ht 67.0 in | Wt 286.0 lb

## 2013-12-23 DIAGNOSIS — Z0000 Encounter for general adult medical examination without abnormal findings: Secondary | ICD-10-CM

## 2013-12-23 DIAGNOSIS — Z86010 Personal history of colonic polyps: Secondary | ICD-10-CM

## 2013-12-23 DIAGNOSIS — R7302 Impaired glucose tolerance (oral): Secondary | ICD-10-CM | POA: Insufficient documentation

## 2013-12-23 DIAGNOSIS — J309 Allergic rhinitis, unspecified: Secondary | ICD-10-CM

## 2013-12-23 DIAGNOSIS — E669 Obesity, unspecified: Secondary | ICD-10-CM

## 2013-12-23 DIAGNOSIS — I10 Essential (primary) hypertension: Secondary | ICD-10-CM

## 2013-12-23 NOTE — Progress Notes (Signed)
Subjective:    Patient ID: Robert Mcguire, male    DOB: October 18, 1960, 53 y.o.   MRN: [id number]  HPI 53 -year-old patient who is seen today for a wellness exam.  Medical problems include obesity;  he has had borderline low testosterone levels and at one point was given a trial of AndroGel. He has a 3-4 year history of gynecomastia and he felt that the AndroGel may have made the gynecomastia worse. He really does not have any significant ED or libido issues and this was not really helped by AndroGel. A followup testosterone level was slightly abnormal. He has treated hypertension on multiple drugs. He has had diuretic associated hypokalemia. Colonoscopy has revealed colonic polyps.  Father also has a history of colon cancer In the spring, the patient underwent a full urological evaluation due to hematuria.  He was noted to have mild BPH and is followed by Dr. Glenna Durand Readings from Last 3 Encounters:  12/23/13 286 lb (129.729 kg)  11/11/13 288 lb (130.636 kg)  10/24/13 280 lb (127.007 kg)   Past Medical History:   GERD  Hypertension  Allergic rhinitis  Nephrolithiasis, hx of  obesity  erectile dysfunction  impaired glucose tolerance   Past Surgical History:   R achilles tendon surgery   Family History:   Family History Diabetes 1st degree relative  Family History Hypertension  Family History Other cancer-Colon, Breast  father  history of obesity, type 2 diabetes, hypertension  H/o colon ca Mother history of hypertension (MGM age 48)  two brothers, both with hypertension  one sister in good health   Family history positive for breast cancer, and colon cancer   Social History:   Married  Never Smoked  2 children d (19, S (27)  Regular exercise-yes  Does Patient Exercise: yes    Review of Systems  Constitutional: Negative for fever, chills, activity change, appetite change and fatigue.  HENT: Negative for congestion, dental problem, ear pain, hearing loss, mouth sores,  rhinorrhea, sinus pressure, sneezing, tinnitus, trouble swallowing and voice change.   Eyes: Negative for photophobia, pain, redness and visual disturbance.  Respiratory: Negative for apnea, cough, choking, chest tightness, shortness of breath and wheezing.   Cardiovascular: Negative for chest pain, palpitations and leg swelling.  Gastrointestinal: Negative for nausea, vomiting, abdominal pain, diarrhea, constipation, blood in stool, abdominal distention, anal bleeding and rectal pain.  Genitourinary: Negative for dysuria, urgency, frequency, hematuria, flank pain, decreased urine volume, discharge, penile swelling, scrotal swelling, difficulty urinating, genital sores and testicular pain.  Musculoskeletal: Negative for arthralgias, back pain, gait problem, joint swelling, myalgias, neck pain and neck stiffness.  Skin: Negative for color change, rash and wound.  Neurological: Negative for dizziness, tremors, seizures, syncope, facial asymmetry, speech difficulty, weakness, light-headedness, numbness and headaches.  Hematological: Negative for adenopathy. Does not bruise/bleed easily.  Psychiatric/Behavioral: Negative for suicidal ideas, hallucinations, behavioral problems, confusion, sleep disturbance, self-injury, dysphoric mood, decreased concentration and agitation. The patient is not nervous/anxious.        Objective:   Physical Exam  Constitutional: He appears well-developed and well-nourished.  HENT:  Head: Normocephalic and atraumatic.  Right Ear: External ear normal.  Left Ear: External ear normal.  Nose: Nose normal.  Mouth/Throat: Oropharynx is clear and moist.  Eyes: Conjunctivae and EOM are normal. Pupils are equal, round, and reactive to light. No scleral icterus.  Neck: Normal range of motion. Neck supple. No JVD present. No thyromegaly present.  Cardiovascular: Regular rhythm, normal heart sounds and intact distal  pulses.  Exam reveals no gallop and no friction rub.   No  murmur heard. Pulmonary/Chest: Effort normal and breath sounds normal. He exhibits no tenderness.  Bilateral gynecomastia  Abdominal: Soft. Bowel sounds are normal. He exhibits no distension and no mass. There is no tenderness.  Genitourinary: Prostate normal and penis normal.  Testicles borderline small  Musculoskeletal: Normal range of motion. He exhibits no edema and no tenderness.  Lymphadenopathy:    He has no cervical adenopathy.  Neurological: He is alert. He has normal reflexes. No cranial nerve deficit. Coordination normal.  Skin: Skin is warm and dry. No rash noted.  Psychiatric: He has a normal mood and affect. His behavior is normal.          Assessment & Plan:  Preventive health examination Hypertension Exogenous obesity. Gynecomastia probably secondary to exogenous obesity.  Impaired glucose tolerance  Weight loss encouraged Low-salt diet recommended Exercise encouraged

## 2013-12-23 NOTE — Progress Notes (Signed)
Pre visit review using our clinic review tool, if applicable. No additional management support is needed unless otherwise documented below in the visit note. 

## 2013-12-23 NOTE — Patient Instructions (Signed)

## 2013-12-24 ENCOUNTER — Telehealth: Payer: Self-pay | Admitting: Internal Medicine

## 2013-12-24 NOTE — Telephone Encounter (Signed)
Relevant patient education assigned to patient using Emmi. ° °

## 2014-01-24 ENCOUNTER — Other Ambulatory Visit: Payer: Self-pay | Admitting: Internal Medicine

## 2014-01-29 ENCOUNTER — Other Ambulatory Visit: Payer: Self-pay | Admitting: Internal Medicine

## 2014-04-25 ENCOUNTER — Other Ambulatory Visit: Payer: Self-pay | Admitting: Internal Medicine

## 2014-06-30 ENCOUNTER — Other Ambulatory Visit: Payer: Self-pay | Admitting: Internal Medicine

## 2014-07-23 ENCOUNTER — Other Ambulatory Visit: Payer: Self-pay | Admitting: Internal Medicine

## 2014-11-23 ENCOUNTER — Other Ambulatory Visit: Payer: Self-pay | Admitting: Family Medicine

## 2015-01-15 ENCOUNTER — Telehealth: Payer: Self-pay | Admitting: Internal Medicine

## 2015-01-15 NOTE — Telephone Encounter (Signed)
[name] - Client [facility] Patient Name: Robert Mcguire DOB: 1961/02/28 Initial Comment caller states he has gurd - anything he eats seems to stay in his upper abd - has been constipated for the last 4 days Nurse Assessment Nurse: Markus Daft, RN, Sherre Poot Date/Time (Eastern Time): 01/15/2015 9:57:31 AM Confirm and document reason for call. If symptomatic, describe symptoms. ---Caller states he has GERD. Anything he eats seems to stay in his upper abdomen, and causes burning over the past 2 days. No sour taste in mouth. Taking antacids and his routine GERD meds, and not helping. He also has been constipated for the last 4 days. Last BM was 4 days ago, Has the patient traveled out of the country within the last 30 days? ---Not Applicable Does the patient require triage? ---Yes Related visit to physician within the last 2 weeks? ---No Does the PT have any chronic conditions? (i.e. diabetes, asthma, etc.) ---Yes List chronic conditions. ---GERD, HTN Guidelines Guideline Title Affirmed Question Affirmed Notes Abdominal Pain - Upper Visible sweat on face or sweat dripping down face Final Disposition User Call EMS 911 Now [place], Therapist, sports, [place]

## 2015-01-15 NOTE — Telephone Encounter (Signed)
Patient states plans to go to UC today.

## 2015-01-15 NOTE — Telephone Encounter (Signed)
Windy,RN from [facility] called stating that the patient refused to call 911 after being triaged. Sherre Poot stated the the patient was have some abdomen discomfort and sweats.

## 2015-02-18 ENCOUNTER — Other Ambulatory Visit: Payer: Self-pay | Admitting: Internal Medicine

## 2015-03-16 ENCOUNTER — Other Ambulatory Visit (INDEPENDENT_AMBULATORY_CARE_PROVIDER_SITE_OTHER): Payer: BLUE CROSS/BLUE SHIELD

## 2015-03-16 DIAGNOSIS — Z0000 Encounter for general adult medical examination without abnormal findings: Secondary | ICD-10-CM

## 2015-03-16 LAB — POCT URINALYSIS DIPSTICK
Bilirubin, UA: NEGATIVE
Glucose, UA: NEGATIVE
Ketones, UA: NEGATIVE
LEUKOCYTES UA: NEGATIVE
NITRITE UA: NEGATIVE
Spec Grav, UA: 1.02
UROBILINOGEN UA: 0.2
pH, UA: 7

## 2015-03-16 LAB — HEPATIC FUNCTION PANEL
ALT: 28 U/L (ref 0–53)
AST: 19 U/L (ref 0–37)
Albumin: 4.3 g/dL (ref 3.5–5.2)
Alkaline Phosphatase: 80 U/L (ref 39–117)
BILIRUBIN DIRECT: 0.2 mg/dL (ref 0.0–0.3)
BILIRUBIN TOTAL: 1.1 mg/dL (ref 0.2–1.2)
Total Protein: 7.8 g/dL (ref 6.0–8.3)

## 2015-03-16 LAB — CBC WITH DIFFERENTIAL/PLATELET
Basophils Absolute: 0 10*3/uL (ref 0.0–0.1)
Basophils Relative: 0.5 % (ref 0.0–3.0)
EOS ABS: 0 10*3/uL (ref 0.0–0.7)
EOS PCT: 1.1 % (ref 0.0–5.0)
HCT: 48.1 % (ref 39.0–52.0)
HEMOGLOBIN: 16.4 g/dL (ref 13.0–17.0)
LYMPHS ABS: 1.9 10*3/uL (ref 0.7–4.0)
Lymphocytes Relative: 42.6 % (ref 12.0–46.0)
MCHC: 34 g/dL (ref 30.0–36.0)
MCV: 89.1 fl (ref 78.0–100.0)
MONO ABS: 0.3 10*3/uL (ref 0.1–1.0)
Monocytes Relative: 6.5 % (ref 3.0–12.0)
NEUTROS PCT: 49.3 % (ref 43.0–77.0)
Neutro Abs: 2.2 10*3/uL (ref 1.4–7.7)
Platelets: 188 10*3/uL (ref 150.0–400.0)
RBC: 5.4 Mil/uL (ref 4.22–5.81)
RDW: 14.7 % (ref 11.5–15.5)
WBC: 4.5 10*3/uL (ref 4.0–10.5)

## 2015-03-16 LAB — LIPID PANEL
CHOL/HDL RATIO: 6
Cholesterol: 214 mg/dL — ABNORMAL HIGH (ref 0–200)
HDL: 38.5 mg/dL — ABNORMAL LOW (ref >39.00)
LDL CALC: 141 mg/dL — AB (ref 0–99)
NONHDL: 175.47
Triglycerides: 171 mg/dL — ABNORMAL HIGH (ref 0.0–149.0)
VLDL: 34.2 mg/dL (ref 0.0–40.0)

## 2015-03-16 LAB — BASIC METABOLIC PANEL
BUN: 15 mg/dL (ref 6–23)
CALCIUM: 9.7 mg/dL (ref 8.4–10.5)
CO2: 26 mEq/L (ref 19–32)
CREATININE: 0.73 mg/dL (ref 0.40–1.50)
Chloride: 101 mEq/L (ref 96–112)
GFR: 143.46 mL/min (ref >60.00)
GLUCOSE: 133 mg/dL — AB (ref 70–99)
POTASSIUM: 3.9 meq/L (ref 3.5–5.1)
Sodium: 138 mEq/L (ref 135–145)

## 2015-03-16 LAB — TSH: TSH: 1.2 u[IU]/mL (ref 0.35–4.50)

## 2015-03-16 LAB — PSA: PSA: 0.88 ng/mL (ref 0.10–4.00)

## 2015-03-22 ENCOUNTER — Ambulatory Visit (INDEPENDENT_AMBULATORY_CARE_PROVIDER_SITE_OTHER): Payer: BLUE CROSS/BLUE SHIELD | Admitting: Internal Medicine

## 2015-03-22 ENCOUNTER — Encounter: Payer: Self-pay | Admitting: Internal Medicine

## 2015-03-22 VITALS — BP 142/88 | HR 61 | Temp 98.4°F | Resp 20 | Ht 67.0 in | Wt 270.0 lb

## 2015-03-22 DIAGNOSIS — R7302 Impaired glucose tolerance (oral): Secondary | ICD-10-CM | POA: Diagnosis not present

## 2015-03-22 DIAGNOSIS — I10 Essential (primary) hypertension: Secondary | ICD-10-CM | POA: Diagnosis not present

## 2015-03-22 DIAGNOSIS — Z23 Encounter for immunization: Secondary | ICD-10-CM | POA: Diagnosis not present

## 2015-03-22 DIAGNOSIS — N62 Hypertrophy of breast: Secondary | ICD-10-CM

## 2015-03-22 DIAGNOSIS — Z0000 Encounter for general adult medical examination without abnormal findings: Secondary | ICD-10-CM

## 2015-03-22 MED ORDER — TADALAFIL 20 MG PO TABS: 10.0000 mg | ORAL_TABLET | ORAL | Status: DC | PRN

## 2015-03-22 NOTE — Progress Notes (Signed)
Subjective:    Patient ID: Robert Mcguire, male    DOB: [birth year], 54 y.o.   MRN: [id number]  HPI 54 year-old patient who is seen today for a wellness exam.  Medical problems include obesity;  he has had borderline low testosterone levels and at one point was given a trial of AndroGel. He has a 3-4 year history of gynecomastia and he felt that the AndroGel may have made the gynecomastia worse. He really does not have any significant ED or libido issues and this was not really helped by AndroGel. A followup testosterone level was slightly abnormal. He has treated hypertension on multiple drugs. He has had diuretic associated hypokalemia. Colonoscopy has revealed colonic polyps.  Father also has a history of colon cancer In the spring 2015, the patient underwent a full urological evaluation due to hematuria.  He was noted to have mild BPH and is followed by Dr. Jeffie Pollock  Over the past year he has lost approximately 20 pounds in weight    Wt Readings from Last 3 Encounters:  03/22/15 270 lb (122.471 kg)  12/23/13 286 lb (129.729 kg)  11/11/13 288 lb (130.636 kg)   Past Medical History:   GERD  Hypertension  Allergic rhinitis  Nephrolithiasis, hx of  obesity  erectile dysfunction  impaired glucose tolerance   Past Surgical History:   R achilles tendon surgery   Family History:   Family History Diabetes 1st degree relative  Family History Hypertension  Family History Other cancer-Colon, Breast  father  history of obesity, type 2 diabetes, hypertension  H/o colon ca Mother history of hypertension (MGM age 77)  two brothers, both with hypertension  one sister in good health   Family history positive for breast cancer, and colon cancer   Social History:   Married  Never Smoked  2 children d (19, S (33)  Regular exercise-yes  Does Patient Exercise: yes    Review of Systems  Constitutional: Negative for fever, chills, activity change, appetite change and fatigue.  HENT:  Negative for congestion, dental problem, ear pain, hearing loss, mouth sores, rhinorrhea, sinus pressure, sneezing, tinnitus, trouble swallowing and voice change.   Eyes: Negative for photophobia, pain, redness and visual disturbance.  Respiratory: Negative for apnea, cough, choking, chest tightness, shortness of breath and wheezing.   Cardiovascular: Negative for chest pain, palpitations and leg swelling.  Gastrointestinal: Negative for nausea, vomiting, abdominal pain, diarrhea, constipation, blood in stool, abdominal distention, anal bleeding and rectal pain.  Genitourinary: Negative for dysuria, urgency, frequency, hematuria, flank pain, decreased urine volume, discharge, penile swelling, scrotal swelling, difficulty urinating, genital sores and testicular pain.  Musculoskeletal: Negative for myalgias, back pain, joint swelling, arthralgias, gait problem, neck pain and neck stiffness.  Skin: Negative for color change, rash and wound.  Neurological: Negative for dizziness, tremors, seizures, syncope, facial asymmetry, speech difficulty, weakness, light-headedness, numbness and headaches.  Hematological: Negative for adenopathy. Does not bruise/bleed easily.  Psychiatric/Behavioral: Negative for suicidal ideas, hallucinations, behavioral problems, confusion, sleep disturbance, self-injury, dysphoric mood, decreased concentration and agitation. The patient is not nervous/anxious.        Objective:   Physical Exam  Constitutional: He appears well-developed and well-nourished.  HENT:  Head: Normocephalic and atraumatic.  Right Ear: External ear normal.  Left Ear: External ear normal.  Nose: Nose normal.  Mouth/Throat: Oropharynx is clear and moist.  Eyes: Conjunctivae and EOM are normal. Pupils are equal, round, and reactive to light. No scleral icterus.  Neck: Normal range of motion. Neck supple.  No JVD present. No thyromegaly present.  Cardiovascular: Regular rhythm, normal heart sounds and  intact distal pulses.  Exam reveals no gallop and no friction rub.   No murmur heard. Pulmonary/Chest: Effort normal and breath sounds normal. He exhibits no tenderness.  Bilateral gynecomastia  Abdominal: Soft. Bowel sounds are normal. He exhibits no distension and no mass. There is no tenderness.  Genitourinary: Prostate normal and penis normal.  Testicles borderline small  Musculoskeletal: Normal range of motion. He exhibits no edema or tenderness.  Lymphadenopathy:    He has no cervical adenopathy.  Neurological: He is alert. He has normal reflexes. No cranial nerve deficit. Coordination normal.  Skin: Skin is warm and dry. No rash noted.  Psychiatric: He has a normal mood and affect. His behavior is normal.          Assessment & Plan:  Preventive health examination Hypertension Exogenous obesity. Gynecomastia probably secondary to exogenous obesity.  Impaired glucose tolerance History colonic polyps.  Follow-up colonoscopy 2017   Weight loss encouraged Low-salt diet recommended Exercise encouraged

## 2015-03-22 NOTE — Addendum Note (Signed)
Addended by: Marian Sorrow on: 03/22/2015 02:07 PM   Modules accepted: Orders

## 2015-03-22 NOTE — Patient Instructions (Addendum)
Limit your sodium (Salt) intake  You need to lose weight.  Consider a lower calorie diet and regular exercise.  Please check your blood pressure on a regular basis.  If it is consistently greater than 150/90, please make an office appointment.  Return in one year for follow-up  Health Maintenance A healthy lifestyle and preventative care can promote health and wellness.  Maintain regular health, dental, and eye exams.  Eat a healthy diet. Foods like vegetables, fruits, whole grains, low-fat dairy products, and lean protein foods contain the nutrients you need and are low in calories. Decrease your intake of foods high in solid fats, added sugars, and salt. Get information about a proper diet from your health care provider, if necessary.  Regular physical exercise is one of the most important things you can do for your health. Most adults should get at least 150 minutes of moderate-intensity exercise (any activity that increases your heart rate and causes you to sweat) each week. In addition, most adults need muscle-strengthening exercises on 2 or more days a week.   Maintain a healthy weight. The body mass index (BMI) is a screening tool to identify possible weight problems. It provides an estimate of body fat based on height and weight. Your health care provider can find your BMI and can help you achieve or maintain a healthy weight. For males 20 years and older:  A BMI below 18.5 is considered underweight.  A BMI of 18.5 to 24.9 is normal.  A BMI of 25 to 29.9 is considered overweight.  A BMI of 30 and above is considered obese.  Maintain normal blood lipids and cholesterol by exercising and minimizing your intake of saturated fat. Eat a balanced diet with plenty of fruits and vegetables. Blood tests for lipids and cholesterol should begin at age 34 and be repeated every 5 years. If your lipid or cholesterol levels are high, you are over age 34, or you are at high risk for heart  disease, you may need your cholesterol levels checked more frequently.Ongoing high lipid and cholesterol levels should be treated with medicines if diet and exercise are not working.  If you smoke, find out from your health care provider how to quit. If you do not use tobacco, do not start.  Lung cancer screening is recommended for adults aged 85-80 years who are at high risk for developing lung cancer because of a history of smoking. A yearly low-dose CT scan of the lungs is recommended for people who have at least a 30-pack-year history of smoking and are current smokers or have quit within the past 15 years. A pack year of smoking is smoking an average of 1 pack of cigarettes a day for 1 year (for example, a 30-pack-year history of smoking could mean smoking 1 pack a day for 30 years or 2 packs a day for 15 years). Yearly screening should continue until the smoker has stopped smoking for at least 15 years. Yearly screening should be stopped for people who develop a health problem that would prevent them from having lung cancer treatment.  If you choose to drink alcohol, do not have more than 2 drinks per day. One drink is considered to be 12 oz (360 mL) of beer, 5 oz (150 mL) of wine, or 1.5 oz (45 mL) of liquor.  Avoid the use of street drugs. Do not share needles with anyone. Ask for help if you need support or instructions about stopping the use of drugs.  High  blood pressure causes heart disease and increases the risk of stroke. Blood pressure should be checked at least every 1-2 years. Ongoing high blood pressure should be treated with medicines if weight loss and exercise are not effective.  If you are 72-48 years old, ask your health care provider if you should take aspirin to prevent heart disease.  Diabetes screening involves taking a blood sample to check your fasting blood sugar level. This should be done once every 3 years after age 53 if you are at a normal weight and without risk  factors for diabetes. Testing should be considered at a younger age or be carried out more frequently if you are overweight and have at least 1 risk factor for diabetes.  Colorectal cancer can be detected and often prevented. Most routine colorectal cancer screening begins at the age of 57 and continues through age 52. However, your health care provider may recommend screening at an earlier age if you have risk factors for colon cancer. On a yearly basis, your health care provider may provide home test kits to check for hidden blood in the stool. A small camera at the end of a tube may be used to directly examine the colon (sigmoidoscopy or colonoscopy) to detect the earliest forms of colorectal cancer. Talk to your health care provider about this at age 36 when routine screening begins. A direct exam of the colon should be repeated every 5-10 years through age 46, unless early forms of precancerous polyps or small growths are found.  People who are at an increased risk for hepatitis B should be screened for this virus. You are considered at high risk for hepatitis B if:  You were born in a country where hepatitis B occurs often. Talk with your health care provider about which countries are considered high risk.  Your parents were born in a high-risk country and you have not received a shot to protect against hepatitis B (hepatitis B vaccine).  You have HIV or AIDS.  You use needles to inject street drugs.  You live with, or have sex with, someone who has hepatitis B.  You are a man who has sex with other men (MSM).  You get hemodialysis treatment.  You take certain medicines for conditions like cancer, organ transplantation, and autoimmune conditions.  Hepatitis C blood testing is recommended for all people born from [birth year] through 1966 and any individual with known risk factors for hepatitis C.  Healthy men should no longer receive prostate-specific antigen (PSA) blood tests as part of  routine cancer screening. Talk to your health care provider about prostate cancer screening.  Testicular cancer screening is not recommended for adolescents or adult males who have no symptoms. Screening includes self-exam, a health care provider exam, and other screening tests. Consult with your health care provider about any symptoms you have or any concerns you have about testicular cancer.  Practice safe sex. Use condoms and avoid high-risk sexual practices to reduce the spread of sexually transmitted infections (STIs).  You should be screened for STIs, including gonorrhea and chlamydia if:  You are sexually active and are younger than 24 years.  You are older than 24 years, and your health care provider tells you that you are at risk for this type of infection.  Your sexual activity has changed since you were last screened, and you are at an increased risk for chlamydia or gonorrhea. Ask your health care provider if you are at risk.  If you are at  risk of being infected with HIV, it is recommended that you take a prescription medicine daily to prevent HIV infection. This is called pre-exposure prophylaxis (PrEP). You are considered at risk if:  You are a man who has sex with other men (MSM).  You are a heterosexual man who is sexually active with multiple partners.  You take drugs by injection.  You are sexually active with a partner who has HIV.  Talk with your health care provider about whether you are at high risk of being infected with HIV. If you choose to begin PrEP, you should first be tested for HIV. You should then be tested every 3 months for as long as you are taking PrEP.  Use sunscreen. Apply sunscreen liberally and repeatedly throughout the day. You should seek shade when your shadow is shorter than you. Protect yourself by wearing long sleeves, pants, a wide-brimmed hat, and sunglasses year round whenever you are outdoors.  Tell your health care provider of new moles  or changes in moles, especially if there is a change in shape or color. Also, tell your health care provider if a mole is larger than the size of a pencil eraser.  A one-time screening for abdominal aortic aneurysm (AAA) and surgical repair of large AAAs by ultrasound is recommended for men aged 46-75 years who are current or former smokers.  Stay current with your vaccines (immunizations). Document Released: 12/20/2007 Document Revised: 06/28/2013 Document Reviewed: 11/18/2010 [facility] Patient Information 2015 [place], [place]. This information is not intended to replace advice given to you by your health care provider. Make sure you discuss any questions you have with your health care provider.

## 2015-03-22 NOTE — Progress Notes (Signed)
Pre visit review using our clinic review tool, if applicable. No additional management support is needed unless otherwise documented below in the visit note. 

## 2015-03-23 ENCOUNTER — Other Ambulatory Visit: Payer: Self-pay | Admitting: *Deleted

## 2015-03-23 MED ORDER — METOPROLOL SUCCINATE ER 100 MG PO TB24: ORAL_TABLET | ORAL | Status: DC

## 2015-03-23 MED ORDER — BENAZEPRIL HCL 40 MG PO TABS: 40.0000 mg | ORAL_TABLET | Freq: Every day | ORAL | Status: DC

## 2015-03-23 MED ORDER — AMLODIPINE BESYLATE 10 MG PO TABS: 10.0000 mg | ORAL_TABLET | Freq: Every day | ORAL | Status: DC

## 2015-08-29 ENCOUNTER — Encounter: Payer: Self-pay | Admitting: Gastroenterology

## 2015-09-04 ENCOUNTER — Telehealth: Payer: Self-pay | Admitting: Internal Medicine

## 2015-09-04 DIAGNOSIS — Z1211 Encounter for screening for malignant neoplasm of colon: Secondary | ICD-10-CM

## 2015-09-04 NOTE — Telephone Encounter (Signed)
Okay to send GI referral for Colonoscopy?

## 2015-09-04 NOTE — Telephone Encounter (Signed)
Spoke to pt, told him order for referral to GI was done and someone will contact you to set up an appt. Pt verbalized understanding.

## 2015-09-04 NOTE — Telephone Encounter (Signed)
Patient is calling to schedule his colonoscopy.please advise

## 2015-09-04 NOTE — Telephone Encounter (Signed)
Okay to schedule colonoscopy. 

## 2015-09-05 ENCOUNTER — Encounter: Payer: Self-pay | Admitting: Gastroenterology

## 2015-10-18 ENCOUNTER — Telehealth: Payer: Self-pay | Admitting: *Deleted

## 2015-10-18 ENCOUNTER — Ambulatory Visit (AMBULATORY_SURGERY_CENTER): Payer: Self-pay | Admitting: *Deleted

## 2015-10-18 VITALS — Ht 67.0 in | Wt 283.0 lb

## 2015-10-18 DIAGNOSIS — Z800 Family history of malignant neoplasm of digestive organs: Secondary | ICD-10-CM

## 2015-10-18 DIAGNOSIS — Z860100 Personal history of colon polyps, unspecified: Secondary | ICD-10-CM

## 2015-10-18 NOTE — Telephone Encounter (Signed)
No issues with past sedation with any surgeries  or procedures,  intubation problems with the achilles surgery at the surgical center in [facility] but had surgery at [facility] with no issues as far as he knows with intubation there. His BMI is 42.4, weight 283.0lb. With open mouth in PV I cannot visualize his uvula  And pt states Dr Raliegh Ip states his palate is low. Does he need to be done in the hospital? thanks for your time,  Marijean Niemann

## 2015-10-18 NOTE — Progress Notes (Signed)
No egg or soy allergy known to patient   No issues with past sedation with any surgeries  or procedures,  intubation problems with the achilles surgery at the surgical center in [facility] but had surgery at [facility] with no issues as far as he knows.    TE sent to [facility]. Pt informed we may have to change to hospital case due to past history   No diet pills per patient No home 02 use per patient  No blood thinners per patient  Pt denies issues with constipation

## 2015-10-22 NOTE — Telephone Encounter (Signed)
Noted. Called pt. No answer, left message.

## 2015-10-22 NOTE — Telephone Encounter (Signed)
Marie,  This pt is cleared for anesthetic care at [facility].  Thanks,  Paytyn Mesta 

## 2015-11-01 ENCOUNTER — Ambulatory Visit (AMBULATORY_SURGERY_CENTER): Payer: BLUE CROSS/BLUE SHIELD | Admitting: Gastroenterology

## 2015-11-01 ENCOUNTER — Encounter: Payer: Self-pay | Admitting: Gastroenterology

## 2015-11-01 VITALS — BP 150/98 | HR 76 | Temp 98.0°F | Resp 23 | Ht 67.0 in | Wt 283.0 lb

## 2015-11-01 DIAGNOSIS — D125 Benign neoplasm of sigmoid colon: Secondary | ICD-10-CM | POA: Diagnosis not present

## 2015-11-01 DIAGNOSIS — Z800 Family history of malignant neoplasm of digestive organs: Secondary | ICD-10-CM

## 2015-11-01 DIAGNOSIS — D124 Benign neoplasm of descending colon: Secondary | ICD-10-CM | POA: Diagnosis not present

## 2015-11-01 DIAGNOSIS — Z86010 Personal history of colonic polyps: Secondary | ICD-10-CM | POA: Diagnosis not present

## 2015-11-01 DIAGNOSIS — D123 Benign neoplasm of transverse colon: Secondary | ICD-10-CM

## 2015-11-01 DIAGNOSIS — Z1211 Encounter for screening for malignant neoplasm of colon: Secondary | ICD-10-CM | POA: Diagnosis not present

## 2015-11-01 MED ORDER — SODIUM CHLORIDE 0.9 % IV SOLN: 500.0000 mL | INTRAVENOUS | Status: DC

## 2015-11-01 NOTE — Progress Notes (Signed)
Pt stated, "they have a hard time with my veins".  Pt was stuck 6 times.  Pt tolerated the multiple sticks well.  Tera Partridge, CRNA did get pt on his right inner wrist #22 successfully. maw

## 2015-11-01 NOTE — Progress Notes (Signed)
A and Ox 3 Report to RN 

## 2015-11-01 NOTE — Patient Instructions (Signed)
NO ASPIRIN ,IBUPROFEN ,NAPROXYN,OR OTHER ANTI INFLAMMATORY PRODUCTS FOR 5 DAYS  RESUME PREVIOUS MEDICATIONS AND DIET   AWAIT PATHOLOGY REPORT  YOU HAD AN ENDOSCOPIC PROCEDURE TODAY AT [facility]:   Refer to the procedure report that was given to you for any specific questions about what was found during the examination.  If the procedure report does not answer your questions, please call your gastroenterologist to clarify.  If you requested that your care partner not be given the details of your procedure findings, then the procedure report has been included in a sealed envelope for you to review at your convenience later.  YOU SHOULD EXPECT: Some feelings of bloating in the abdomen. Passage of more gas than usual.  Walking can help get rid of the air that was put into your GI tract during the procedure and reduce the bloating. If you had a lower endoscopy (such as a colonoscopy or flexible sigmoidoscopy) you may notice spotting of blood in your stool or on the toilet paper. If you underwent a bowel prep for your procedure, you may not have a normal bowel movement for a few days.  Please Note:  You might notice some irritation and congestion in your nose or some drainage.  This is from the oxygen used during your procedure.  There is no need for concern and it should clear up in a day or so.  SYMPTOMS TO REPORT IMMEDIATELY:   Following lower endoscopy (colonoscopy or flexible sigmoidoscopy):  Excessive amounts of blood in the stool  Significant tenderness or worsening of abdominal pains  Swelling of the abdomen that is new, acute  Fever of 100F or higher    For urgent or emergent issues, a gastroenterologist can be reached at any hour by calling [phone].   DIET: Your first meal following the procedure should be a small meal and then it is ok to progress to your normal diet. Heavy or fried foods are harder to digest and may make you feel nauseous or bloated.   Likewise, meals heavy in dairy and vegetables can increase bloating.  Drink plenty of fluids but you should avoid alcoholic beverages for 24 hours.  ACTIVITY:  You should plan to take it easy for the rest of today and you should NOT DRIVE or use heavy machinery until tomorrow (because of the sedation medicines used during the test).    FOLLOW UP: Our staff will call the number listed on your records the next business day following your procedure to check on you and address any questions or concerns that you may have regarding the information given to you following your procedure. If we do not reach you, we will leave a message.  However, if you are feeling well and you are not experiencing any problems, there is no need to return our call.  We will assume that you have returned to your regular daily activities without incident.  If any biopsies were taken you will be contacted by phone or by letter within the next 1-3 weeks.  Please call us at [phone] if you have not heard about the biopsies in 3 weeks.    SIGNATURES/CONFIDENTIALITY: You and/or your care partner have signed paperwork which will be entered into your electronic medical record.  These signatures attest to the fact that that the information above on your After Visit Summary has been reviewed and is understood.  Full responsibility of the confidentiality of this discharge information lies with you and/or your care-partner.

## 2015-11-01 NOTE — Op Note (Signed)
[facility] Patient Name: Robert Mcguire Procedure Date: 11/01/2015 1:02 PM MRN: [id number] Endoscopist: [place]. Loletha Carrow , MD Age: 55 Date of Birth: 1960/07/23 Gender: Male Procedure:                Colonoscopy Indications:              Surveillance: Personal history of adenomatous                            polyps on last colonoscopy > 5 years ago Medicines:                Monitored Anesthesia Care Procedure:                Pre-Anesthesia Assessment:                           - Prior to the procedure, a History and Physical                            was performed, and patient medications and                            allergies were reviewed. The patient's tolerance of                            previous anesthesia was also reviewed. The risks                            and benefits of the procedure and the sedation                            options and risks were discussed with the patient.                            All questions were answered, and informed consent                            was obtained. Prior Anticoagulants: The patient has                            taken no previous anticoagulant or antiplatelet                            agents. ASA Grade Assessment: II - A patient with                            mild systemic disease. After reviewing the risks                            and benefits, the patient was deemed in                            satisfactory condition to undergo the procedure.  After obtaining informed consent, the colonoscope                            was passed under direct vision. Throughout the                            procedure, the patient's blood pressure, pulse, and                            oxygen saturations were monitored continuously. The                            Model CF-HQ190L [id number]) scope was introduced                            through the anus and advanced to the the cecum,           identified by appendiceal orifice and ileocecal                            valve. The colonoscopy was performed without                            difficulty. The patient tolerated the procedure                            well. The quality of the bowel preparation was                            good. The ileocecal valve, appendiceal orifice, and                            rectum were photographed. Scope In: 1:40:36 PM Scope Out: 2:00:21 PM Scope Withdrawal Time: 0 hours 13 minutes 48 seconds  Total Procedure Duration: 0 hours 19 minutes 45 seconds  Findings:                 The perianal and digital rectal examinations were                            normal.                           Four sessile polyps were found in the proximal                            sigmoid colon, distal transverse colon and hepatic                            flexure. The polyps were 4 to 8 mm in size. These                            polyps were removed with a cold snare. Resection  and retrieval were complete.                           A 10 mm polyp was found in the proximal descending                            colon. The polyp was semi-pedunculated. The polyp                            was removed with a hot snare. Resection and                            retrieval were complete.                           The exam was otherwise without abnormality on                            direct and retroflexion views. Complications:            No immediate complications. Estimated Blood Loss:     Estimated blood loss: none. Impression:               - Four 4 to 8 mm polyps in the proximal sigmoid                            colon, in the distal transverse colon and at the                            hepatic flexure, removed with a cold snare.                            Resected and retrieved.                           - One 10 mm polyp in the proximal descending colon,                             removed with a hot snare. Resected and retrieved.                           - The examination was otherwise normal on direct                            and retroflexion views. Recommendation:           - Patient has a contact number available for                            emergencies. The signs and symptoms of potential                            delayed complications were discussed with the  patient. Return to normal activities tomorrow.                            Written discharge instructions were provided to the                            patient.                           - Resume previous diet.                           - No aspirin, ibuprofen, naproxen, or other                            non-steroidal anti-inflammatory drugs for 5 days                            after polyp removal.                           - Await pathology results.                           - Repeat colonoscopy is recommended for                            surveillance. The colonoscopy date will be                            determined after pathology results from today's                            exam become available for review. Henry L. Loletha Carrow, MD 11/01/2015 2:07:02 PM This report has been signed electronically.

## 2015-11-01 NOTE — Progress Notes (Signed)
Called to room to assist during endoscopic procedure.  Patient ID and intended procedure confirmed with present staff. Received instructions for my participation in the procedure from the performing physician.  

## 2015-11-02 ENCOUNTER — Telehealth: Payer: Self-pay | Admitting: *Deleted

## 2015-11-02 DIAGNOSIS — H401132 Primary open-angle glaucoma, bilateral, moderate stage: Secondary | ICD-10-CM | POA: Diagnosis not present

## 2015-11-02 NOTE — Telephone Encounter (Signed)
  Follow up Call-  Call back number 11/01/2015  Post procedure Call Back phone  # [phone] cell  Permission to leave phone message Yes     Patient questions:  Do you have a fever, pain , or abdominal swelling? No. Pain Score  0 *  Have you tolerated food without any problems? Yes.    Have you been able to return to your normal activities? Yes.    Do you have any questions about your discharge instructions: Diet   No. Medications  No. Follow up visit  No.  Do you have questions or concerns about your Care? No.  Actions: * If pain score is 4 or above: No action needed, pain <4.

## 2015-11-06 ENCOUNTER — Other Ambulatory Visit: Payer: Self-pay | Admitting: Internal Medicine

## 2015-11-07 ENCOUNTER — Encounter: Payer: Self-pay | Admitting: Gastroenterology

## 2015-11-19 DIAGNOSIS — H401132 Primary open-angle glaucoma, bilateral, moderate stage: Secondary | ICD-10-CM | POA: Diagnosis not present

## 2015-12-07 DIAGNOSIS — H401132 Primary open-angle glaucoma, bilateral, moderate stage: Secondary | ICD-10-CM | POA: Diagnosis not present

## 2015-12-26 DIAGNOSIS — H401132 Primary open-angle glaucoma, bilateral, moderate stage: Secondary | ICD-10-CM | POA: Diagnosis not present

## 2016-01-18 ENCOUNTER — Telehealth: Payer: Self-pay | Admitting: Internal Medicine

## 2016-01-18 ENCOUNTER — Ambulatory Visit (INDEPENDENT_AMBULATORY_CARE_PROVIDER_SITE_OTHER): Payer: BLUE CROSS/BLUE SHIELD | Admitting: Internal Medicine

## 2016-01-18 ENCOUNTER — Encounter: Payer: Self-pay | Admitting: Internal Medicine

## 2016-01-18 VITALS — BP 150/90 | HR 70 | Temp 98.6°F | Ht 67.0 in | Wt 277.0 lb

## 2016-01-18 DIAGNOSIS — I10 Essential (primary) hypertension: Secondary | ICD-10-CM

## 2016-01-18 MED ORDER — MELOXICAM 15 MG PO TABS: 15.0000 mg | ORAL_TABLET | Freq: Every day | ORAL | Status: DC

## 2016-01-18 NOTE — Progress Notes (Signed)
Pre visit review using our clinic review tool, if applicable. No additional management support is needed unless otherwise documented below in the visit note. 

## 2016-01-18 NOTE — Telephone Encounter (Signed)
Pt would like to have Rx (meloxicam [facility]) resent to [facility] in [place]  Instead of [facility]

## 2016-01-18 NOTE — Progress Notes (Signed)
Subjective:    Patient ID: Robert Mcguire, male    DOB: [birth year], 55 y.o.   MRN: [id number]  HPI 55 year old patient who has essential hypertension.  He presents with a three-week history of right hip and right flank and chest wall discomfort.  Pain is aggravated by movement, especially twisting at the waist.  He states that he played 9 holes of golf shortly before the onset of his discomfort.  Past Medical History  Diagnosis Date  . GERD (gastroesophageal reflux disease)   . Hypertension   . Allergic rhinitis   . Nephrolithiasis   . Obesity   . ED (erectile dysfunction)   . Impaired glucose tolerance   . Allergy      Social History   Social History  . Marital Status: Married    Spouse Name: N/A  . Number of Children: N/A  . Years of Education: N/A   Occupational History  . Not on file.   Social History Main Topics  . Smoking status: Never Smoker   . Smokeless tobacco: Never Used  . Alcohol Use: 0.0 oz/week    0 Standard drinks or equivalent per week     Comment: occasionally  . Drug Use: No  . Sexual Activity: Not on file   Other Topics Concern  . Not on file   Social History Narrative    Past Surgical History  Procedure Laterality Date  . Right achilles tendon surgery    . Colonoscopy    . Polypectomy    . Difficultity with intabation      Family History  Problem Relation Age of Onset  . Hypertension Mother   . Obesity Father   . Diabetes Father   . Hypertension Father   . Colon cancer Father 41  . Colon polyps Father   . Healthy Sister   . Hypertension Brother   . Colon polyps Brother   . Hypertension Maternal Grandmother   . Diabetes Other   . Hypertension Other   . Breast cancer Other     breast, colon   . Stomach cancer Other     paternal great grand father  . Hypertension Brother   . Esophageal cancer Neg Hx   . Rectal cancer Neg Hx     No Known Allergies  Current Outpatient Prescriptions on File Prior to Visit  Medication Sig  Dispense Refill  . amLODipine (NORVASC) 10 MG tablet Take 1 tablet (10 mg total) by mouth daily. 90 tablet 3  . benazepril (LOTENSIN) 40 MG tablet Take 1 tablet (40 mg total) by mouth daily. 90 tablet 3  . lansoprazole (PREVACID) 30 MG capsule TAKE 1 CAPSULE DAILY 90 capsule 1  . metoprolol succinate (TOPROL-XL) 100 MG 24 hr tablet TAKE 1 TABLET DAILY WITH OR IMMEDIATELY FOLLOWING A MEAL 90 tablet 3  . tadalafil (CIALIS) 20 MG tablet Take 0.5-1 tablets (10-20 mg total) by mouth every other day as needed for erectile dysfunction. 5 tablet 11  . vardenafil (LEVITRA) 10 MG tablet Take 10 mg by mouth daily as needed.     No current facility-administered medications on file prior to visit.    BP 150/90 mmHg  Pulse 70  Temp(Src) 98.6 F (37 C) (Oral)  Ht 5\' 7"  (1.702 m)  Wt 277 lb (125.646 kg)  BMI 43.37 kg/m2  SpO2 97%      Review of Systems  Constitutional: Negative for fever, chills, appetite change and fatigue.  HENT: Negative for congestion, dental problem, ear pain, hearing loss,  sore throat, tinnitus, trouble swallowing and voice change.   Eyes: Negative for pain, discharge and visual disturbance.  Respiratory: Negative for cough, chest tightness, wheezing and stridor.   Cardiovascular: Negative for chest pain, palpitations and leg swelling.  Gastrointestinal: Negative for nausea, vomiting, abdominal pain, diarrhea, constipation, blood in stool and abdominal distention.  Genitourinary: Positive for flank pain. Negative for urgency, hematuria, discharge, difficulty urinating and genital sores.  Musculoskeletal: Positive for back pain. Negative for myalgias, joint swelling, arthralgias, gait problem and neck stiffness.  Skin: Negative for rash.  Neurological: Negative for dizziness, syncope, speech difficulty, weakness, numbness and headaches.  Hematological: Negative for adenopathy. Does not bruise/bleed easily.  Psychiatric/Behavioral: Negative for behavioral problems and  dysphoric mood. The patient is not nervous/anxious.        Objective:   Physical Exam  Constitutional: He is oriented to person, place, and time. He appears well-developed.  HENT:  Head: Normocephalic.  Right Ear: External ear normal.  Left Ear: External ear normal.  Eyes: Conjunctivae and EOM are normal.  Neck: Normal range of motion.  Cardiovascular: Normal rate and normal heart sounds.   Pulmonary/Chest: Breath sounds normal. He exhibits no tenderness.  No chest wall or flank tenderness  Abdominal: Bowel sounds are normal. He exhibits no distension. There is no tenderness. There is no guarding.  Musculoskeletal: Normal range of motion. He exhibits no edema or tenderness.  Neurological: He is alert and oriented to person, place, and time.  Psychiatric: He has a normal mood and affect. His behavior is normal.          Assessment & Plan:   Right chest wall and flank discomfort.  Musculoligamentous secondary to overuse.  We'll try mother 50 mg daily.  Will avoid aggravating activities.  Will report any new or worsening symptoms  Essential hypertension, stable  Nyoka Cowden, MD

## 2016-01-18 NOTE — Patient Instructions (Signed)
You  may move around, but avoid painful motions and activities.

## 2016-01-22 MED ORDER — MELOXICAM 15 MG PO TABS: 15.0000 mg | ORAL_TABLET | Freq: Every day | ORAL | Status: DC

## 2016-01-22 NOTE — Telephone Encounter (Signed)
Rx sent to [facility]

## 2016-03-04 ENCOUNTER — Other Ambulatory Visit: Payer: Self-pay | Admitting: Internal Medicine

## 2016-03-04 NOTE — Telephone Encounter (Signed)
Rx refill sent to pharmacy. 

## 2016-03-14 DIAGNOSIS — S86912A Strain of unspecified muscle(s) and tendon(s) at lower leg level, left leg, initial encounter: Secondary | ICD-10-CM | POA: Diagnosis not present

## 2016-03-26 DIAGNOSIS — H401132 Primary open-angle glaucoma, bilateral, moderate stage: Secondary | ICD-10-CM | POA: Diagnosis not present

## 2016-04-12 ENCOUNTER — Other Ambulatory Visit: Payer: Self-pay | Admitting: Internal Medicine

## 2016-04-17 ENCOUNTER — Other Ambulatory Visit: Payer: Self-pay | Admitting: Internal Medicine

## 2016-04-22 ENCOUNTER — Telehealth: Payer: Self-pay | Admitting: Internal Medicine

## 2016-04-22 MED ORDER — ONDANSETRON HCL 4 MG PO TABS: 4.0000 mg | ORAL_TABLET | Freq: Four times a day (QID) | ORAL | 0 refills | Status: DC | PRN

## 2016-04-22 NOTE — Telephone Encounter (Signed)
Please see message and advise 

## 2016-04-22 NOTE — Telephone Encounter (Signed)
Generic Zofran 4 mg #12 one every 6 hours as needed for nausea

## 2016-04-22 NOTE — Telephone Encounter (Signed)
Spoke to pt, told him Rx for Zofran for nausea was sent to pharmacy for him. Pt verbalized understanding.

## 2016-04-22 NOTE — Telephone Encounter (Addendum)
Pt is going on 2 day [place] fishing trip and would like sea sickness pills call into [facility] [address] in [place]. Pt is leaving on trip on this thurs

## 2016-05-07 ENCOUNTER — Other Ambulatory Visit: Payer: Self-pay | Admitting: Internal Medicine

## 2016-05-07 DIAGNOSIS — R7302 Impaired glucose tolerance (oral): Secondary | ICD-10-CM

## 2016-05-07 DIAGNOSIS — I10 Essential (primary) hypertension: Secondary | ICD-10-CM

## 2016-05-07 DIAGNOSIS — Z0000 Encounter for general adult medical examination without abnormal findings: Secondary | ICD-10-CM

## 2016-05-10 ENCOUNTER — Other Ambulatory Visit: Payer: Self-pay | Admitting: Internal Medicine

## 2016-05-13 ENCOUNTER — Other Ambulatory Visit (INDEPENDENT_AMBULATORY_CARE_PROVIDER_SITE_OTHER): Payer: BLUE CROSS/BLUE SHIELD

## 2016-05-13 DIAGNOSIS — R7989 Other specified abnormal findings of blood chemistry: Secondary | ICD-10-CM | POA: Diagnosis not present

## 2016-05-13 DIAGNOSIS — Z0000 Encounter for general adult medical examination without abnormal findings: Secondary | ICD-10-CM

## 2016-05-13 DIAGNOSIS — R7302 Impaired glucose tolerance (oral): Secondary | ICD-10-CM | POA: Diagnosis not present

## 2016-05-13 DIAGNOSIS — I10 Essential (primary) hypertension: Secondary | ICD-10-CM | POA: Diagnosis not present

## 2016-05-13 LAB — BASIC METABOLIC PANEL
BUN: 14 mg/dL (ref 6–23)
CHLORIDE: 102 meq/L (ref 96–112)
CO2: 27 meq/L (ref 19–32)
Calcium: 9.8 mg/dL (ref 8.4–10.5)
Creatinine, Ser: 0.85 mg/dL (ref 0.40–1.50)
GFR: 119.84 mL/min (ref >60.00)
Glucose, Bld: 183 mg/dL — ABNORMAL HIGH (ref 70–99)
Potassium: 4.1 mEq/L (ref 3.5–5.1)
SODIUM: 139 meq/L (ref 135–145)

## 2016-05-13 LAB — LIPID PANEL
CHOLESTEROL: 234 mg/dL — AB (ref 0–200)
HDL: 33.5 mg/dL — ABNORMAL LOW (ref >39.00)
NonHDL: 200.64
Total CHOL/HDL Ratio: 7
Triglycerides: 223 mg/dL — ABNORMAL HIGH (ref 0.0–149.0)
VLDL: 44.6 mg/dL — ABNORMAL HIGH (ref 0.0–40.0)

## 2016-05-13 LAB — CBC WITH DIFFERENTIAL/PLATELET
BASOS ABS: 0 10*3/uL (ref 0.0–0.1)
Basophils Relative: 0.3 % (ref 0.0–3.0)
EOS ABS: 0.1 10*3/uL (ref 0.0–0.7)
Eosinophils Relative: 1.2 % (ref 0.0–5.0)
HCT: 42.9 % (ref 39.0–52.0)
Hemoglobin: 14.9 g/dL (ref 13.0–17.0)
LYMPHS ABS: 2.3 10*3/uL (ref 0.7–4.0)
Lymphocytes Relative: 39.5 % (ref 12.0–46.0)
MCHC: 34.7 g/dL (ref 30.0–36.0)
MCV: 87.9 fl (ref 78.0–100.0)
Monocytes Absolute: 0.4 10*3/uL (ref 0.1–1.0)
Monocytes Relative: 7.2 % (ref 3.0–12.0)
NEUTROS ABS: 3.1 10*3/uL (ref 1.4–7.7)
NEUTROS PCT: 51.8 % (ref 43.0–77.0)
PLATELETS: 207 10*3/uL (ref 150.0–400.0)
RBC: 4.89 Mil/uL (ref 4.22–5.81)
RDW: 13.5 % (ref 11.5–15.5)
WBC: 5.9 10*3/uL (ref 4.0–10.5)

## 2016-05-13 LAB — HEPATIC FUNCTION PANEL
ALK PHOS: 68 U/L (ref 39–117)
ALT: 31 U/L (ref 0–53)
AST: 22 U/L (ref 0–37)
Albumin: 4 g/dL (ref 3.5–5.2)
BILIRUBIN DIRECT: 0.2 mg/dL (ref 0.0–0.3)
TOTAL PROTEIN: 7.2 g/dL (ref 6.0–8.3)
Total Bilirubin: 1.3 mg/dL — ABNORMAL HIGH (ref 0.2–1.2)

## 2016-05-13 LAB — POCT URINALYSIS DIPSTICK
Bilirubin, UA: NEGATIVE
Glucose, UA: NEGATIVE
Ketones, UA: NEGATIVE
LEUKOCYTES UA: NEGATIVE
NITRITE UA: NEGATIVE
PH UA: 6
Spec Grav, UA: 1.025
Urobilinogen, UA: 0.2

## 2016-05-13 LAB — LDL CHOLESTEROL, DIRECT: LDL DIRECT: 168 mg/dL

## 2016-05-13 LAB — TSH: TSH: 1.23 u[IU]/mL (ref 0.35–4.50)

## 2016-05-13 LAB — TESTOSTERONE: TESTOSTERONE: 235.12 ng/dL — AB (ref 300.00–890.00)

## 2016-05-13 LAB — PSA: PSA: 0.96 ng/mL (ref 0.10–4.00)

## 2016-05-20 ENCOUNTER — Ambulatory Visit (INDEPENDENT_AMBULATORY_CARE_PROVIDER_SITE_OTHER): Payer: BLUE CROSS/BLUE SHIELD | Admitting: Internal Medicine

## 2016-05-20 ENCOUNTER — Encounter: Payer: Self-pay | Admitting: Internal Medicine

## 2016-05-20 VITALS — BP 142/100 | HR 75 | Temp 98.9°F | Ht 67.5 in | Wt 274.0 lb

## 2016-05-20 DIAGNOSIS — R7302 Impaired glucose tolerance (oral): Secondary | ICD-10-CM | POA: Diagnosis not present

## 2016-05-20 DIAGNOSIS — Z0000 Encounter for general adult medical examination without abnormal findings: Secondary | ICD-10-CM | POA: Diagnosis not present

## 2016-05-20 LAB — POCT GLYCOSYLATED HEMOGLOBIN (HGB A1C): Hemoglobin A1C: 8.1

## 2016-05-20 MED ORDER — TESTOSTERONE 20.25 MG/ACT (1.62%) TD GEL: 2.0000 "application " | TRANSDERMAL | 5 refills | Status: DC

## 2016-05-20 MED ORDER — METFORMIN HCL ER (MOD) 500 MG PO TB24: 1000.0000 mg | ORAL_TABLET | Freq: Every day | ORAL | 6 refills | Status: DC

## 2016-05-20 MED ORDER — CANAGLIFLOZIN 300 MG PO TABS: 300.0000 mg | ORAL_TABLET | Freq: Every day | ORAL | 6 refills | Status: DC

## 2016-05-20 NOTE — Progress Notes (Signed)
Subjective:    Patient ID: Robert Mcguire, male    DOB: July 03, 1961, 55 y.o.   MRN: [id number]  HPI  55 year old patient who is seen today for a preventive health examination. He has a long history of impaired glucose tolerance and recent fasting blood sugar greater than 180 Laboratory screen also revealed testosterone deficiency.  He denies any libido issues, but medications.  For ED are no longer as effective. He has had a recent colonoscopy He has treated hypertension which has been stable. No new concerns or complaints  Past Medical History:  Diagnosis Date  . Allergic rhinitis   . Allergy   . ED (erectile dysfunction)   . GERD (gastroesophageal reflux disease)   . Hypertension   . Impaired glucose tolerance   . Nephrolithiasis   . Obesity      Social History   Social History  . Marital status: Married    Spouse name: N/A  . Number of children: N/A  . Years of education: N/A   Occupational History  . Not on file.   Social History Main Topics  . Smoking status: Never Smoker  . Smokeless tobacco: Never Used  . Alcohol use 0.0 oz/week     Comment: occasionally  . Drug use: No  . Sexual activity: Not on file   Other Topics Concern  . Not on file   Social History Narrative  . No narrative on file    Past Surgical History:  Procedure Laterality Date  . COLONOSCOPY    . difficultity with intabation    . POLYPECTOMY    . right achilles tendon surgery      Family History  Problem Relation Age of Onset  . Hypertension Mother   . Obesity Father   . Diabetes Father   . Hypertension Father   . Colon cancer Father 47  . Colon polyps Father   . Healthy Sister   . Hypertension Brother   . Colon polyps Brother   . Hypertension Maternal Grandmother   . Diabetes Other   . Hypertension Other   . Breast cancer Other     breast, colon   . Stomach cancer Other     paternal great grand father  . Hypertension Brother   . Esophageal cancer Neg Hx   . Rectal  cancer Neg Hx     No Known Allergies  Current Outpatient Prescriptions on File Prior to Visit  Medication Sig Dispense Refill  . amLODipine (NORVASC) 10 MG tablet Take 1 tablet (10 mg total) by mouth daily. 90 tablet 3  . benazepril (LOTENSIN) 40 MG tablet TAKE 1 TABLET DAILY 90 tablet 3  . lansoprazole (PREVACID) 30 MG capsule TAKE 1 CAPSULE DAILY 90 capsule 1  . latanoprost (XALATAN) 0.005 % ophthalmic solution     . meloxicam (MOBIC) 15 MG tablet Take 1 tablet (15 mg total) by mouth daily. 30 tablet 0  . metoprolol succinate (TOPROL-XL) 100 MG 24 hr tablet TAKE 1 TABLET DAILY WITH OR IMMEDIATELY FOLLOWING A MEAL 90 tablet 1  . tadalafil (CIALIS) 20 MG tablet Take 0.5-1 tablets (10-20 mg total) by mouth every other day as needed for erectile dysfunction. 5 tablet 11  . ondansetron (ZOFRAN) 4 MG tablet Take 1 tablet (4 mg total) by mouth every 6 (six) hours as needed for nausea. (Patient not taking: Reported on 05/20/2016) 12 tablet 0  . vardenafil (LEVITRA) 10 MG tablet Take 10 mg by mouth daily as needed.     No current  facility-administered medications on file prior to visit.     BP (!) 142/100 (BP Location: Left Arm, Patient Position: Sitting, Cuff Size: Large)   Pulse 75   Temp 98.9 F (37.2 C) (Oral)   Ht 5' 7.5" (1.715 m)   Wt 274 lb (124.3 kg)   SpO2 96%   BMI 42.28 kg/m     Review of Systems  Constitutional: Negative for activity change, appetite change, chills, fatigue and fever.  HENT: Negative for congestion, dental problem, ear pain, hearing loss, mouth sores, rhinorrhea, sinus pressure, sneezing, tinnitus, trouble swallowing and voice change.   Eyes: Negative for photophobia, pain, redness and visual disturbance.  Respiratory: Negative for apnea, cough, choking, chest tightness, shortness of breath and wheezing.   Cardiovascular: Negative for chest pain, palpitations and leg swelling.  Gastrointestinal: Negative for abdominal distention, abdominal pain, anal  bleeding, blood in stool, constipation, diarrhea, nausea, rectal pain and vomiting.  Genitourinary: Negative for decreased urine volume, difficulty urinating, discharge, dysuria, flank pain, frequency, genital sores, hematuria, penile swelling, scrotal swelling, testicular pain and urgency.  Musculoskeletal: Negative for arthralgias, back pain, gait problem, joint swelling, myalgias, neck pain and neck stiffness.  Skin: Negative for color change, rash and wound.  Neurological: Negative for dizziness, tremors, seizures, syncope, facial asymmetry, speech difficulty, weakness, light-headedness, numbness and headaches.  Hematological: Negative for adenopathy. Does not bruise/bleed easily.  Psychiatric/Behavioral: Negative for agitation, behavioral problems, confusion, decreased concentration, dysphoric mood, hallucinations, self-injury, sleep disturbance and suicidal ideas. The patient is not nervous/anxious.        Objective:   Physical Exam  Constitutional: He appears well-developed and well-nourished.  Blood pressure 140/90  Weight 274  HENT:  Head: Normocephalic and atraumatic.  Right Ear: External ear normal.  Left Ear: External ear normal.  Nose: Nose normal.  Mouth/Throat: Oropharynx is clear and moist.  Eyes: Conjunctivae and EOM are normal. Pupils are equal, round, and reactive to light. No scleral icterus.  Neck: Normal range of motion. Neck supple. No JVD present. No thyromegaly present.  Cardiovascular: Regular rhythm, normal heart sounds and intact distal pulses.  Exam reveals no gallop and no friction rub.   No murmur heard. Pulmonary/Chest: Effort normal and breath sounds normal. He exhibits no tenderness.  Gynecomastia  Abdominal: Soft. Bowel sounds are normal. He exhibits no distension and no mass. There is no tenderness.  Genitourinary: Penis normal.  Genitourinary Comments: uncircumcised  Musculoskeletal: Normal range of motion. He exhibits no edema or tenderness.    Lymphadenopathy:    He has no cervical adenopathy.  Neurological: He is alert. He has normal reflexes. No cranial nerve deficit. Coordination normal.  Skin: Skin is warm and dry. No rash noted.  Psychiatric: He has a normal mood and affect. His behavior is normal.          Assessment & Plan:   Preventive health examination Diabetes mellitus type 2 Essential hypertension Exogenous obesity History colonic polyps.  Follow-up colonoscopy in 5 years Testosterone deficiency.  We'll start topical therapy  Weight loss encouraged  .  Recheck 3 months  Nyoka Cowden

## 2016-05-20 NOTE — Progress Notes (Signed)
Pre visit review using our clinic review tool, if applicable. No additional management support is needed unless otherwise documented below in the visit note. 

## 2016-05-20 NOTE — Patient Instructions (Addendum)
Limit your sodium (Salt) intake  Please check your blood pressure on a regular basis.  If it is consistently greater than 150/90, please make an office appointment.  You need to lose weight.  Consider a lower calorie diet and regular exercise.    It is important that you exercise regularly, at least 20 minutes 3 to 4 times per week.  If you develop chest pain or shortness of breath seek  medical attention.   Diabetes Mellitus and Exercise Exercising regularly is important for your overall health, especially when you have diabetes (diabetes mellitus). Exercising is not only about losing weight. It has many health benefits, such as increasing muscle strength and bone density and reducing body fat and stress. This leads to improved fitness, flexibility, and endurance, all of which result in better overall health. Exercise has additional benefits for people with diabetes, including:  Reducing appetite.  Helping to lower and control blood glucose.  Lowering blood pressure.  Helping to control amounts of fatty substances (lipids) in the blood, such as cholesterol and triglycerides.  Helping the body to respond better to insulin (improving insulin sensitivity).  Reducing how much insulin the body needs.  Decreasing the risk for heart disease by:  Lowering cholesterol and triglyceride levels.  Increasing the levels of good cholesterol.  Lowering blood glucose levels. What is my activity plan? Your health care provider or certified diabetes educator can help you make a plan for the type and frequency of exercise (activity plan) that works for you. Make sure that you:  Do at least 150 minutes of moderate-intensity or vigorous-intensity exercise each week. This could be brisk walking, biking, or water aerobics.  Do stretching and strength exercises, such as yoga or weightlifting, at least 2 times a week.  Spread out your activity over at least 3 days of the week.  Get some form of  physical activity every day.  Do not go more than 2 days in a row without some kind of physical activity.  Avoid being inactive for more than 90 minutes at a time. Take frequent breaks to walk or stretch.  Choose a type of exercise or activity that you enjoy, and set realistic goals.  Start slowly, and gradually increase the intensity of your exercise over time. What do I need to know about managing my diabetes?  Check your blood glucose before and after exercising.  If your blood glucose is higher than 240 mg/dL (13.3 mmol/L) before you exercise, check your urine for ketones. If you have ketones in your urine, do not exercise until your blood glucose returns to normal.  Know the symptoms of low blood glucose (hypoglycemia) and how to treat it. Your risk for hypoglycemia increases during and after exercise. Common symptoms of hypoglycemia can include:  Hunger.  Anxiety.  Sweating and feeling clammy.  Confusion.  Dizziness or feeling light-headed.  Increased heart rate or palpitations.  Blurry vision.  Tingling or numbness around the mouth, lips, or tongue.  Tremors or shakes.  Irritability.  Keep a rapid-acting carbohydrate snack available before, during, and after exercise to help prevent or treat hypoglycemia.  Avoid injecting insulin into areas of the body that are going to be exercised. For example, avoid injecting insulin into:  The arms, when playing tennis.  The legs, when jogging.  Keep records of your exercise habits. Doing this can help you and your health care provider adjust your diabetes management plan as needed. Write down:  Food that you eat before and after you  exercise.  Blood glucose levels before and after you exercise.  The type and amount of exercise you have done.  When your insulin is expected to peak, if you use insulin. Avoid exercising at times when your insulin is peaking.  When you start a new exercise or activity, work with your  health care provider to make sure the activity is safe for you, and to adjust your insulin, medicines, or food intake as needed.  Drink plenty of water while you exercise to prevent dehydration or heat stroke. Drink enough fluid to keep your urine clear or pale yellow. This information is not intended to replace advice given to you by your health care provider. Make sure you discuss any questions you have with your health care provider. Document Released: 09/13/2003 Document Revised: 01/11/2016 Document Reviewed: 12/03/2015 Elsevier Interactive Patient Education  2017 [employer] for Eating Away From Home If You Have Diabetes Controlling your level of blood glucose, also known as blood sugar, can be challenging. It can be even more difficult when you do not prepare your own meals. The following tips can help you manage your diabetes when you eat away from home. Planning ahead Plan ahead if you know you will be eating away from home:  Ask your health care provider how to time meals and medicine if you are taking insulin.  Make a list of restaurants near you that offer healthy choices. If they have a carry-out menu, take it home and plan what you will order ahead of time.  Look up the restaurant you want to eat at online. Many chain and fast-food restaurants list nutritional information online. Use this information to choose the healthiest options and to calculate how many carbohydrates will be in your meal.  Use a carbohydrate-counting book or mobile app to look up the carbohydrate content and serving size of the foods you want to eat.  Become familiar with serving sizes and learn to recognize how many servings are in a portion. This will allow you to estimate how many carbohydrates you can eat. Free foods A "free food" is any food or drink that has less than 5 g of carbohydrates per serving. Free foods include:  Many vegetables.  Hard boiled eggs.  Nuts or  seeds.  Olives.  Cheeses.  Meats. These types of foods make good appetizer choices and are often available at salad bars. Lemon juice, vinegar, or a low-calorie salad dressing of fewer than 20 calories per serving can be used as a "free" salad dressing. Choices to reduce carbohydrates  Substitute nonfat sweetened yogurt with a sugar-free yogurt. Yogurt made from soy milk may also be used, but you will still want a sugar-free or plain option to choose a lower carbohydrate amount.  Ask your server to take away the bread basket or chips from your table.  Order fresh fruit. A salad bar often offers fresh fruit choices. Avoid canned fruit because it is usually packed in sugar or syrup.  Order a salad, and eat it without dressing. Or, create a "free" salad dressing.  Ask for substitutions. For example, instead of [place] fries, request an order of a vegetable such as salad, green beans, or broccoli. Other tips  If you take insulin, take the insulin once your food arrives to your table. This will ensure your insulin and food are timed correctly.  Ask your server about the portion size before your order, and ask for a take-out box if the portion has more servings than  you should have. When your food comes, leave the amount you should have on the plate, and put the rest in the take-out box.  Consider splitting an entree with someone and ordering a side salad. This information is not intended to replace advice given to you by your health care provider. Make sure you discuss any questions you have with your health care provider. Document Released: 06/23/2005 Document Revised: 11/29/2015 Document Reviewed: 09/20/2013 Elsevier Interactive Patient Education  2017 [employer].  Type 2 Diabetes Mellitus, Diagnosis, Adult Type 2 diabetes (type 2 diabetes mellitus) is a long-term (chronic) disease. In type 2 diabetes, one or both of these problems may be present:  The pancreas does not make enough of  a hormone called insulin.  Cells in the body do not respond properly to insulin that the body makes (insulin resistance). Normally, insulin allows blood sugar (glucose) to enter cells in the body. The cells use glucose for energy. Insulin resistance or lack of insulin causes excess glucose to build up in the blood instead of going into cells. As a result, high blood glucose (hyperglycemia) develops. What increases the risk? The following factors may make you more likely to develop type 2 diabetes:  Having a family member with type 2 diabetes.  Being overweight or obese.  Having an inactive (sedentary) lifestyle.  Having been diagnosed with insulin resistance.  Having a history of prediabetes, gestational diabetes, or polycystic ovarian syndrome (PCOS).  Being of American-Indian, African-American, Hispanic/Latino, or Asian/Pacific Islander descent. What are the signs or symptoms? In the early stage of this condition, you may not have symptoms. Symptoms develop slowly and may include:  Increased thirst (polydipsia).  Increased hunger(polyphagia).  Increased urination (polyuria).  Increased urination during the night (nocturia).  Unexplained weight loss.  Frequent infections that keep coming back (recurring).  Fatigue.  Weakness.  Vision changes, such as blurry vision.  Cuts or bruises that are slow to heal.  Tingling or numbness in the hands or feet.  Dark patches on the skin (acanthosis nigricans). How is this diagnosed?   This condition is diagnosed based on your symptoms, your medical history, a physical exam, and your blood glucose level. Your blood glucose may be checked with one or more of the following blood tests:  A fasting blood glucose (FBG) test. You will not be allowed to eat (you will fast) for at least 8 hours before a blood sample is taken.  A random blood glucose test. This checks blood glucose at any time of day regardless of when you ate.  An A1c  (hemoglobin A1c) blood test. This provides information about blood glucose control over the previous 2-3 months.  An oral glucose tolerance test (OGTT). This measures your blood glucose at two times:  After fasting. This is your baseline blood glucose level.  Two hours after drinking a beverage that contains glucose. You may be diagnosed with type 2 diabetes if:  Your FBG level is 126 mg/dL (7.0 mmol/L) or higher.  Your random blood glucose level is 200 mg/dL (11.1 mmol/L) or higher.  Your A1c level is 6.5% or higher.  Your OGGT result is higher than 200 mg/dL (11.1 mmol/L). These blood tests may be repeated to confirm your diagnosis. How is this treated? Your treatment may be managed by a specialist called an endocrinologist. Type 2 diabetes may be treated by following instructions from your health care provider about:  Making diet and lifestyle changes. This may include:  Following an individualized nutrition plan that is  developed by a diet and nutrition specialist (registered dietitian).  Exercising regularly.  Finding ways to manage stress.  Checking your blood glucose level as often as directed.  Taking diabetes medicines or insulin daily. This helps to keep your blood glucose levels in the healthy range.  If you use insulin, you may need to adjust the dosage depending on how physically active you are and what foods you eat. Your health care provider will tell you how to adjust your dosage.  Taking medicines to help prevent complications from diabetes, such as:  Aspirin.  Medicine to lower cholesterol.  Medicine to control blood pressure. Your health care provider will set individualized treatment goals for you. Your goals will be based on your age, other medical conditions you have, and how you respond to diabetes treatment. Generally, the goal of treatment is to maintain the following blood glucose levels:  Before meals (preprandial): 80-130 mg/dL (4.4-7.2  mmol/L).  After meals (postprandial): below 180 mg/dL (10 mmol/L).  A1c level: less than 7%. Follow these instructions at home: Questions to [facility] Provider  Consider asking the following questions:  Do I need to meet with a diabetes educator?  Where can I find a support group for people with diabetes?  What equipment will I need to manage my diabetes at home?  What diabetes medicines do I need, and when should I take them?  How often do I need to check my blood glucose?  What number can I call if I have questions?  When is my next appointment? General instructions  Take over-the-counter and prescription medicines only as told by your health care provider.  Keep all follow-up visits as told by your health care provider. This is important.  For more information about diabetes, visit:  American Diabetes Association (ADA): www.diabetes.org  American Association of Diabetes Educators (AADE): www.diabeteseducator.org/patient-resources Contact a health care provider if:  Your blood glucose is at or above 240 mg/dL (13.3 mmol/L) for 2 days in a row.  You have been sick or have had a fever for 2 days or longer and you are not getting better.  You have any of the following problems for more than 6 hours:  You cannot eat or drink.  You have nausea and vomiting.  You have diarrhea. Get help right away if:  Your blood glucose is lower than 54 mg/dL (3.0 mmol/L).  You become confused or you have trouble thinking clearly.  You have difficulty breathing.  You have moderate or large ketone levels in your urine. This information is not intended to replace advice given to you by your health care provider. Make sure you discuss any questions you have with your health care provider. Document Released: 06/23/2005 Document Revised: 11/29/2015 Document Reviewed: 07/27/2015 Elsevier Interactive Patient Education  2017 [employer].

## 2016-05-20 NOTE — Progress Notes (Signed)
Subjective:             HPI        Review of Systems        Objective:    Physical Exam        Assessment:             Plan:

## 2016-05-21 ENCOUNTER — Telehealth: Payer: Self-pay

## 2016-05-21 NOTE — Telephone Encounter (Signed)
Received PA request on Invokana and metformin. Invokana approved & metformin doesn't need PA. Both forms faxed back to pharmacy.

## 2016-05-21 NOTE — Telephone Encounter (Signed)
Received PA request from [place] for Androgel. PA approved & form faxed back to pharmacy.

## 2016-05-22 ENCOUNTER — Telehealth: Payer: Self-pay | Admitting: Internal Medicine

## 2016-05-22 NOTE — Telephone Encounter (Signed)
Pt needs a PA for metformin please call [phone]

## 2016-05-22 NOTE — Telephone Encounter (Signed)
SARA please call pt wife tomorrow

## 2016-05-23 ENCOUNTER — Other Ambulatory Visit: Payer: Self-pay | Admitting: Internal Medicine

## 2016-05-23 NOTE — Telephone Encounter (Signed)
Called and left VM for Robert Mcguire. Let her know that I spoke with the insurance company and they showed that the Rx has already been picked up. Advised for her to call back if she still needs to speak with me.

## 2016-05-23 NOTE — Telephone Encounter (Signed)
Robert Mcguire returned my call and let me know that it is the Androgel is the Rx that is having issues processing. Called and spoke with the insurance company. The pharmacy was running the Androgel as packets and not the pump. Insurance changed the coding around and the pump is approved. Called and spoke with pharmacy and they changed it to the pump and the Rx went through.

## 2016-05-23 NOTE — Telephone Encounter (Signed)
Called and spoke with insurance company. PA was attempted on 11/15, but insurance said no PA was needed. Insurance ran a test claim and all they get is a refill too soon, Rx was picked up yesterday.

## 2016-05-23 NOTE — Telephone Encounter (Signed)
Mrs. Filbrun is aware.

## 2016-05-26 ENCOUNTER — Telehealth: Payer: Self-pay | Admitting: Internal Medicine

## 2016-05-26 NOTE — Telephone Encounter (Signed)
° °  Pt said the below med is causing him some stomach issues and is asking if he can take a lower dose     metFORMIN (GLUMETZA) 500 MG (MOD) 24 hr tablet

## 2016-05-26 NOTE — Telephone Encounter (Signed)
Please see message and advise 

## 2016-05-26 NOTE — Telephone Encounter (Signed)
Have patient hold metformin for a few days, then rechallenge with 1 tablet daily with his largest meal

## 2016-05-27 NOTE — Telephone Encounter (Signed)
Pt called back, told him Dr.K said to hold metformin for a few days, then rechallenge with 1 tablet daily with your largest meal and see if you can tolerate it. Let me know if it does not work. Pt verbalized understanding.

## 2016-07-17 ENCOUNTER — Telehealth: Payer: Self-pay | Admitting: Internal Medicine

## 2016-07-17 NOTE — Telephone Encounter (Signed)
° ° °  Wife is asking for a call back she has questions about a prior auth on the below med  Testosterone (ANDROGEL PUMP) 20.25 MG/ACT (1.62%) GEL

## 2016-07-17 NOTE — Telephone Encounter (Signed)
° ° ° ° °  Wife call to say pt need prior auth for the below med

## 2016-07-17 NOTE — Telephone Encounter (Signed)
° ° ° °  Wife said the prior auth

## 2016-07-17 NOTE — Telephone Encounter (Signed)
° ° ° ° °  Wife call to say they need a prior auth for the following med

## 2016-07-21 NOTE — Telephone Encounter (Signed)
PA approved, form faxed back to pharmacy. Wife is aware.

## 2016-08-19 ENCOUNTER — Ambulatory Visit: Payer: BLUE CROSS/BLUE SHIELD | Admitting: Internal Medicine

## 2016-08-20 ENCOUNTER — Ambulatory Visit (INDEPENDENT_AMBULATORY_CARE_PROVIDER_SITE_OTHER): Payer: BLUE CROSS/BLUE SHIELD | Admitting: Internal Medicine

## 2016-08-20 ENCOUNTER — Encounter: Payer: Self-pay | Admitting: Internal Medicine

## 2016-08-20 VITALS — BP 142/66 | HR 70 | Temp 97.9°F | Ht 67.5 in | Wt 258.4 lb

## 2016-08-20 DIAGNOSIS — E119 Type 2 diabetes mellitus without complications: Secondary | ICD-10-CM | POA: Diagnosis not present

## 2016-08-20 DIAGNOSIS — I10 Essential (primary) hypertension: Secondary | ICD-10-CM | POA: Diagnosis not present

## 2016-08-20 LAB — POCT GLYCOSYLATED HEMOGLOBIN (HGB A1C): Hemoglobin A1C: 5.7

## 2016-08-20 NOTE — Progress Notes (Signed)
Subjective:    Patient ID: Robert Mcguire, male    DOB: 31-Oct-1960, 56 y.o.   MRN: [id number]  HPI  Wt Readings from Last 3 Encounters:  08/20/16 258 lb 6.4 oz (117.2 kg)  05/20/16 274 lb (124.3 kg)  01/18/16 277 lb (81.48 kg)   56 year old patient seen today for follow-up of new onset diabetes.  He was seen for a physical.  3 months ago and hemoglobin A1c 8.1.  He has not been able to tolerate max dose of metformin.  He is on Invokana.  He has had a nice 16 pound weight loss  Fasting blood sugars are generally 80-106.  Blood sugars later in the day generally 07[birth year]1958 to 170  Past Medical History:  Diagnosis Date  . Allergic rhinitis   . Allergy   . ED (erectile dysfunction)   . GERD (gastroesophageal reflux disease)   . Hypertension   . Impaired glucose tolerance   . Nephrolithiasis   . Obesity      Social History   Social History  . Marital status: Married    Spouse name: N/A  . Number of children: N/A  . Years of education: N/A   Occupational History  . Not on file.   Social History Main Topics  . Smoking status: Never Smoker  . Smokeless tobacco: Never Used  . Alcohol use 0.0 oz/week     Comment: occasionally  . Drug use: No  . Sexual activity: Not on file   Other Topics Concern  . Not on file   Social History Narrative  . No narrative on file    Past Surgical History:  Procedure Laterality Date  . COLONOSCOPY    . difficultity with intabation    . POLYPECTOMY    . right achilles tendon surgery      Family History  Problem Relation Age of Onset  . Hypertension Mother   . Obesity Father   . Diabetes Father   . Hypertension Father   . Colon cancer Father 37  . Colon polyps Father   . Healthy Sister   . Hypertension Brother   . Colon polyps Brother   . Hypertension Maternal Grandmother   . Diabetes Other   . Hypertension Other   . Breast cancer Other     breast, colon   . Stomach cancer Other     paternal great grand father  .  Hypertension Brother   . Esophageal cancer Neg Hx   . Rectal cancer Neg Hx     No Known Allergies  Current Outpatient Prescriptions on File Prior to Visit  Medication Sig Dispense Refill  . amLODipine (NORVASC) 10 MG tablet TAKE 1 TABLET DAILY 90 tablet 3  . benazepril (LOTENSIN) 40 MG tablet TAKE 1 TABLET DAILY 90 tablet 3  . canagliflozin (INVOKANA) 300 MG TABS tablet Take 1 tablet (300 mg total) by mouth daily before breakfast. 30 tablet 6  . lansoprazole (PREVACID) 30 MG capsule TAKE 1 CAPSULE DAILY 90 capsule 1  . latanoprost (XALATAN) 0.005 % ophthalmic solution     . meloxicam (MOBIC) 15 MG tablet Take 1 tablet (15 mg total) by mouth daily. 30 tablet 0  . metFORMIN (GLUMETZA) 500 MG (MOD) 24 hr tablet Take 2 tablets (1,000 mg total) by mouth daily with breakfast. 180 tablet 6  . metoprolol succinate (TOPROL-XL) 100 MG 24 hr tablet TAKE 1 TABLET DAILY WITH OR IMMEDIATELY FOLLOWING A MEAL 90 tablet 1  . ondansetron (ZOFRAN) 4 MG tablet Take 1  tablet (4 mg total) by mouth every 6 (six) hours as needed for nausea. (Patient not taking: Reported on 05/20/2016) 12 tablet 0  . tadalafil (CIALIS) 20 MG tablet Take 0.5-1 tablets (10-20 mg total) by mouth every other day as needed for erectile dysfunction. 5 tablet 11  . Testosterone (ANDROGEL PUMP) 20.25 MG/ACT (1.62%) GEL Place 2 application onto the skin every morning. 75 g 5  . vardenafil (LEVITRA) 10 MG tablet Take 10 mg by mouth daily as needed.     No current facility-administered medications on file prior to visit.     BP (!) 142/66 (BP Location: Right Arm, Patient Position: Sitting, Cuff Size: Large)   Pulse 70   Temp 97.9 F (36.6 C) (Oral)   Ht 5' 7.5" (1.715 m)   Wt 258 lb 6.4 oz (117.2 kg)   SpO2 96%   BMI 39.87 kg/m    Review of Systems  Constitutional: Negative for appetite change, chills, fatigue and fever.  HENT: Negative for congestion, dental problem, ear pain, hearing loss, sore throat, tinnitus, trouble  swallowing and voice change.   Eyes: Negative for pain, discharge and visual disturbance.  Respiratory: Negative for cough, chest tightness, wheezing and stridor.   Cardiovascular: Negative for chest pain, palpitations and leg swelling.  Gastrointestinal: Negative for abdominal distention, abdominal pain, blood in stool, constipation, diarrhea, nausea and vomiting.  Genitourinary: Negative for difficulty urinating, discharge, flank pain, genital sores, hematuria and urgency.  Musculoskeletal: Negative for arthralgias, back pain, gait problem, joint swelling, myalgias and neck stiffness.  Skin: Negative for rash.  Neurological: Negative for dizziness, syncope, speech difficulty, weakness, numbness and headaches.  Hematological: Negative for adenopathy. Does not bruise/bleed easily.  Psychiatric/Behavioral: Negative for behavioral problems and dysphoric mood. The patient is not nervous/anxious.        Objective:   Physical Exam  Constitutional: He is oriented to person, place, and time. He appears well-developed.  Blood pressure well controlled Weight 258  HENT:  Head: Normocephalic.  Right Ear: External ear normal.  Left Ear: External ear normal.  Eyes: Conjunctivae and EOM are normal.  Neck: Normal range of motion.  Cardiovascular: Normal rate and normal heart sounds.   Pulmonary/Chest: Breath sounds normal.  Abdominal: Bowel sounds are normal.  Musculoskeletal: Normal range of motion. He exhibits no edema or tenderness.  Neurological: He is alert and oriented to person, place, and time.  Psychiatric: He has a normal mood and affect. His behavior is normal.          Assessment & Plan:   Diabetes mellitus.  Will check hemoglobin A1c.  Follow-up 3 months Essential hypertension, well-controlled Obesity.  Improved  Follow-up 3 months Annual eye examination encouraged  Nyoka Cowden

## 2016-08-20 NOTE — Progress Notes (Signed)
Pre visit review using our clinic review tool, if applicable. No additional management support is needed unless otherwise documented below in the visit note. 

## 2016-08-20 NOTE — Patient Instructions (Addendum)
Limit your sodium (Salt) intake    It is important that you exercise regularly, at least 20 minutes 3 to 4 times per week.  If you develop chest pain or shortness of breath seek  medical attention.  You need to lose weight.  Consider a lower calorie diet and regular exercise.  Please see your eye doctor yearly to check for diabetic eye damage  Check on the price of Jardiance or Farxiga

## 2016-08-21 ENCOUNTER — Ambulatory Visit: Payer: Self-pay | Admitting: Internal Medicine

## 2016-08-26 ENCOUNTER — Other Ambulatory Visit: Payer: Self-pay | Admitting: Internal Medicine

## 2016-08-26 MED ORDER — LANSOPRAZOLE 30 MG PO CPDR: 30.0000 mg | DELAYED_RELEASE_CAPSULE | Freq: Every day | ORAL | 1 refills | Status: DC

## 2016-08-28 ENCOUNTER — Other Ambulatory Visit: Payer: Self-pay | Admitting: Internal Medicine

## 2016-08-28 MED ORDER — LANSOPRAZOLE 30 MG PO CPDR: 30.0000 mg | DELAYED_RELEASE_CAPSULE | Freq: Every day | ORAL | 1 refills | Status: DC

## 2016-09-05 ENCOUNTER — Encounter: Payer: BLUE CROSS/BLUE SHIELD | Attending: Internal Medicine | Admitting: Registered"

## 2016-09-05 DIAGNOSIS — E119 Type 2 diabetes mellitus without complications: Secondary | ICD-10-CM

## 2016-09-05 DIAGNOSIS — Z713 Dietary counseling and surveillance: Secondary | ICD-10-CM | POA: Insufficient documentation

## 2016-09-05 NOTE — Progress Notes (Signed)
Diabetes Self-Management Education  Visit Type: First/Initial  Appt. Start Time: 0800 Appt. End Time: [id number]  09/05/2016  [name], identified by name and date of birth, is a 56 y.o. male with a diagnosis of Diabetes: Type 2.   ASSESSMENT Patient presents with new diagnosis of Type 2 DM and states since his diagnosis 3 months ago (A1c 8.1) has made changes including eating less fried food and less quantity. He also states he has resumed regular physical activity. With these changes he reports ~16 lb weight loss in 3 months and his lifestyle changes as well as the medication has brought his A1C is down to 5.7. Patient states he would like ideas of how to eat healthy taking into consideration he has a long commute each day.  Height 5' 7.5" (1.715 m), weight 257 lb 6.4 oz (116.8 kg). Body mass index is 39.72 kg/m.      Diabetes Self-Management Education - 09/05/16 0757      Visit Information   Visit Type First/Initial     Initial Visit   Diabetes Type Type 2   Are you currently following a meal plan? No   Are you taking your medications as prescribed? Yes   Date Diagnosed 05/2016     Health Coping   How would you rate your overall health? Good     Psychosocial Assessment   Patient Belief/Attitude about Diabetes Motivated to manage diabetes   How often do you need to have someone help you when you read instructions, pamphlets, or other written materials from your doctor or pharmacy? 1 - Never   What is the last grade level you completed in school? college     Complications   Last HgB A1C per patient/outside source 5.7 %  per pt   How often do you check your blood sugar? 1-2 times/day  checks am and 1 hr after dinner   Fasting Blood glucose range (mg/dL) 70-129   Postprandial Blood glucose range (mg/dL) 130-179   Number of hypoglycemic episodes per month 0   Number of hyperglycemic episodes per week 0   Have you had a dilated eye exam in the past 12 months? Yes   Have you  had a dental exam in the past 12 months? Yes   Are you checking your feet? Yes   How many days per week are you checking your feet? 7     Dietary Intake   Breakfast egg, ham, cheese mcmuffin, coffee w cream   in car on way to work   Snack (morning) apple   Lunch subway sandwich OR soup broccoli & cheddar OR chic noodle soup or reg chick sand 1x week [employer] OR grilled chicken salad   Snack (afternoon) nuts  3 pm   Dinner during week grilled chicken, salad OR [employer] fried chicken, coleslaw, mashed potatoes OR hamburger w cheese   Snack (evening) none OR fruit   Beverage(s) unsweet tea or water, ~1 can coke per month     Exercise   Exercise Type Light (walking / raking leaves)   How many days per week to you exercise? 3   How many minutes per day do you exercise? 40   Total minutes per week of exercise 120     Patient Education   Previous Diabetes Education No   Disease state  Definition of diabetes, type 1 and 2, and the diagnosis of diabetes   Nutrition management  Role of diet in the treatment of diabetes and the relationship  between the three main macronutrients and blood glucose level;Carbohydrate counting   Physical activity and exercise  Role of exercise on diabetes management, blood pressure control and cardiac health.   Medications Reviewed patients medication for diabetes, action, purpose, timing of dose and side effects.   Monitoring Identified appropriate SMBG and/or A1C goals.     Individualized Goals (developed by patient)   Nutrition Follow meal plan discussed   Physical Activity Exercise 5-7 days per week;30 minutes per day     Outcomes   Expected Outcomes Demonstrated interest in learning. Expect positive outcomes   Future DMSE 4-6 wks   Program Status Not Completed      Individualized Plan for Diabetes Self-Management Training:   Learning Objective:  Patient will have a greater understanding of diabetes self-management. Patient education plan is to attend  individual and/or group sessions per assessed needs and concerns.  Patient Instructions  Plan:  Aim for 2-4 Carb Choices per meal (30-60 grams)  Aim for 0-2 Carbs per snack if hungry Include protein in moderation with your meals and snacks Consider looking at www.calorieking.com when eating out. Consider  increasing your activity level by 30 min an additional day as tolerated Continue checking BG at alternate times per day as directed by MD  Continue taking medication as directed by MD   Return in about a month to complete education including:  Practice identifying carbs and carb counting  Low sodium tips / eating out strategies  Snack suggestions  Tips for reducing GERD symptoms  Expected Outcomes:  Demonstrated interest in learning. Expect positive outcomes  Education material provided: Living Well with Diabetes, A1C conversion sheet, Meal plan card and My Plate  If problems or questions, patient to contact team via:  Phone and Email  Future DSME appointment: 4-6 wks

## 2016-09-05 NOTE — Patient Instructions (Signed)
Plan:  Aim for 2-4 Carb Choices per meal (30-60 grams)  Aim for 0-2 Carbs per snack if hungry Include protein in moderation with your meals and snacks Consider looking at www.calorieking.com when eating out. Consider  increasing your activity level by 30 min an additional day as tolerated Continue checking BG at alternate times per day as directed by MD  Continue taking medication as directed by MD

## 2016-09-24 DIAGNOSIS — H401132 Primary open-angle glaucoma, bilateral, moderate stage: Secondary | ICD-10-CM | POA: Diagnosis not present

## 2016-09-30 ENCOUNTER — Encounter: Payer: Self-pay | Admitting: Registered"

## 2016-09-30 ENCOUNTER — Encounter: Payer: BLUE CROSS/BLUE SHIELD | Admitting: Registered"

## 2016-09-30 DIAGNOSIS — E119 Type 2 diabetes mellitus without complications: Secondary | ICD-10-CM

## 2016-09-30 DIAGNOSIS — Z713 Dietary counseling and surveillance: Secondary | ICD-10-CM | POA: Diagnosis not present

## 2016-09-30 NOTE — Progress Notes (Signed)
Diabetes Self-Management Education  Visit Type:    Appt. Start Time: 1630 Appt. End Time: 1700  09/30/2016  [name], identified by name and date of birth, is a 56 y.o. male with a diagnosis of Diabetes:  .   ASSESSMENT Patient returns to continue diabetes education. Since last visit patient reports that he has started paying attention to balancing his meals and including more veggies. Pt states since he has been eating more fruits and vegetables he is feeling better overall. Pt states he has a good feel for carb counting now.  Pt reports BG 2 hours after eating in the 140s. Physical activity trying to get another 30 min in per week. A couple times a day walks the stairway at work.  Intervention: Discussed importance of balancing carbs and proteins in snacks and meals. Discussed risk of vitamin B12 deficiency with prolonged PPI use. Discussed strategies to manage GERD.  Education material provided: snack sheet  If problems or questions, patient to contact team via:  Phone and Email  Future DSME appointment:  prn

## 2016-09-30 NOTE — Patient Instructions (Addendum)
Consider taking a B12 vitamin because of long-term use of PPI (Pravacid) Balance your snack and meals carbohydrates and protein Consider eating mindfully, chewing food well and taking at least 20 min for meals Continue the great work getting in more physical activity at work!

## 2016-10-08 ENCOUNTER — Encounter: Payer: Self-pay | Admitting: Family Medicine

## 2016-10-08 DIAGNOSIS — H401131 Primary open-angle glaucoma, bilateral, mild stage: Secondary | ICD-10-CM | POA: Diagnosis not present

## 2016-10-08 LAB — HM DIABETES EYE EXAM

## 2016-10-26 DIAGNOSIS — N390 Urinary tract infection, site not specified: Secondary | ICD-10-CM | POA: Diagnosis not present

## 2016-10-27 ENCOUNTER — Other Ambulatory Visit: Payer: Self-pay | Admitting: Internal Medicine

## 2016-11-18 ENCOUNTER — Encounter: Payer: Self-pay | Admitting: Internal Medicine

## 2016-11-18 ENCOUNTER — Ambulatory Visit (INDEPENDENT_AMBULATORY_CARE_PROVIDER_SITE_OTHER): Payer: BLUE CROSS/BLUE SHIELD | Admitting: Internal Medicine

## 2016-11-18 VITALS — BP 146/66 | HR 78 | Temp 98.4°F | Wt 253.6 lb

## 2016-11-18 DIAGNOSIS — E119 Type 2 diabetes mellitus without complications: Secondary | ICD-10-CM

## 2016-11-18 DIAGNOSIS — I10 Essential (primary) hypertension: Secondary | ICD-10-CM

## 2016-11-18 LAB — POCT GLYCOSYLATED HEMOGLOBIN (HGB A1C): Hemoglobin A1C: 5.9

## 2016-11-18 MED ORDER — TESTOSTERONE 20.25 MG/ACT (1.62%) TD GEL: 2.0000 "application " | TRANSDERMAL | 5 refills | Status: DC

## 2016-11-18 NOTE — Patient Instructions (Addendum)
WE NOW OFFER   [facility] FAST TRACK!!!  SAME DAY Appointments for ACUTE CARE  Such as: Sprains, Injuries, cuts, abrasions, rashes, muscle pain, joint pain, back pain Colds, flu, sore throats, headache, allergies, cough, fever  Ear pain, sinus and eye infections Abdominal pain, nausea, vomiting, diarrhea, upset stomach Animal/insect bites  3 Easy Ways to Schedule: Walk-In Scheduling Call in scheduling Mychart Sign-up: [email or web address]   Limit your sodium (Salt) intake    It is important that you exercise regularly, at least 20 minutes 3 to 4 times per week.  If you develop chest pain or shortness of breath seek  medical attention.  You need to lose weight.  Consider a lower calorie diet and regular exercise.   Please check your hemoglobin A1c every 3-6  months

## 2016-11-18 NOTE — Progress Notes (Signed)
Subjective:    Patient ID: Robert Mcguire, male    DOB: 05-22-1961, 56 y.o.   MRN: [id number]  HPI  56 year old patient who is seen today for follow-up of fairly new onset diabetes. He continues to do well with a very nice home glycemic control  Lab Results  Component Value Date   HGBA1C 5.7 08/20/2016   He was seen at an urgent care 3 weeks ago for a UTI.  He is planning on urology follow-up. He has essential hypertension which has been stable He has been seen by nutrition and has lost 5 pounds since his last visit here  Past Medical History:  Diagnosis Date  . Allergic rhinitis   . Allergy   . ED (erectile dysfunction)   . GERD (gastroesophageal reflux disease)   . Hypertension   . Impaired glucose tolerance   . Nephrolithiasis   . Obesity      Social History   Social History  . Marital status: Married    Spouse name: N/A  . Number of children: N/A  . Years of education: N/A   Occupational History  . Not on file.   Social History Main Topics  . Smoking status: Never Smoker  . Smokeless tobacco: Never Used  . Alcohol use 0.0 oz/week     Comment: occasionally  . Drug use: No  . Sexual activity: Not on file   Other Topics Concern  . Not on file   Social History Narrative  . No narrative on file    Past Surgical History:  Procedure Laterality Date  . COLONOSCOPY    . difficultity with intabation    . POLYPECTOMY    . right achilles tendon surgery      Family History  Problem Relation Age of Onset  . Hypertension Mother   . Obesity Father   . Diabetes Father   . Hypertension Father   . Colon cancer Father 34  . Colon polyps Father   . Diabetes Other   . Hypertension Other   . Breast cancer Other        breast, colon   . Stomach cancer Other        paternal great grand father  . Healthy Sister   . Hypertension Brother   . Colon polyps Brother   . Hypertension Maternal Grandmother   . Hypertension Brother   . Esophageal cancer Neg Hx   .  Rectal cancer Neg Hx     No Known Allergies  Current Outpatient Prescriptions on File Prior to Visit  Medication Sig Dispense Refill  . amLODipine (NORVASC) 10 MG tablet TAKE 1 TABLET DAILY 90 tablet 3  . benazepril (LOTENSIN) 40 MG tablet TAKE 1 TABLET DAILY 90 tablet 3  . canagliflozin (INVOKANA) 300 MG TABS tablet Take 1 tablet (300 mg total) by mouth daily before breakfast. 30 tablet 6  . lansoprazole (PREVACID) 30 MG capsule Take 1 capsule (30 mg total) by mouth daily. 90 capsule 1  . latanoprost (XALATAN) 0.005 % ophthalmic solution     . meloxicam (MOBIC) 15 MG tablet Take 1 tablet (15 mg total) by mouth daily. 30 tablet 0  . metFORMIN (GLUMETZA) 500 MG (MOD) 24 hr tablet Take 2 tablets (1,000 mg total) by mouth daily with breakfast. 180 tablet 6  . metoprolol succinate (TOPROL-XL) 100 MG 24 hr tablet TAKE 1 TABLET DAILY WITH ORIMMEDIATELY FOLLOWING A    MEAL 90 tablet 1  . ondansetron (ZOFRAN) 4 MG tablet Take 1 tablet (4  mg total) by mouth every 6 (six) hours as needed for nausea. 12 tablet 0  . tadalafil (CIALIS) 20 MG tablet Take 0.5-1 tablets (10-20 mg total) by mouth every other day as needed for erectile dysfunction. 5 tablet 11  . Testosterone (ANDROGEL PUMP) 20.25 MG/ACT (1.62%) GEL Place 2 application onto the skin every morning. 75 g 5  . vardenafil (LEVITRA) 10 MG tablet Take 10 mg by mouth daily as needed.     No current facility-administered medications on file prior to visit.     BP (!) 146/66 (BP Location: Left Arm, Patient Position: Sitting, Cuff Size: Large)   Pulse 78   Temp 98.4 F (36.9 C) (Oral)   Wt 253 lb 9.6 oz (115 kg)   SpO2 97%   BMI 39.13 kg/m     Review of Systems  Constitutional: Negative for appetite change, chills, fatigue and fever.  HENT: Negative for congestion, dental problem, ear pain, hearing loss, sore throat, tinnitus, trouble swallowing and voice change.   Eyes: Negative for pain, discharge and visual disturbance.  Respiratory:  Negative for cough, chest tightness, wheezing and stridor.   Cardiovascular: Negative for chest pain, palpitations and leg swelling.  Gastrointestinal: Negative for abdominal distention, abdominal pain, blood in stool, constipation, diarrhea, nausea and vomiting.  Genitourinary: Negative for difficulty urinating, discharge, flank pain, genital sores, hematuria and urgency.  Musculoskeletal: Negative for arthralgias, back pain, gait problem, joint swelling, myalgias and neck stiffness.  Skin: Negative for rash.  Neurological: Negative for dizziness, syncope, speech difficulty, weakness, numbness and headaches.  Hematological: Negative for adenopathy. Does not bruise/bleed easily.  Psychiatric/Behavioral: Negative for behavioral problems and dysphoric mood. The patient is not nervous/anxious.        Objective:   Physical Exam  Constitutional: He is oriented to person, place, and time. He appears well-developed.  Weight 253 Blood pressure 130/64  HENT:  Head: Normocephalic.  Right Ear: External ear normal.  Left Ear: External ear normal.  Eyes: Conjunctivae and EOM are normal.  Neck: Normal range of motion.  Cardiovascular: Normal rate and normal heart sounds.   Pulmonary/Chest: Breath sounds normal.  Abdominal: Bowel sounds are normal.  Musculoskeletal: Normal range of motion. He exhibits no edema or tenderness.  Neurological: He is alert and oriented to person, place, and time.  Psychiatric: He has a normal mood and affect. His behavior is normal.          Assessment & Plan:   Diabetes mellitus.  Well-controlled.  We'll review hemoglobin A1c.  If this remains in a nondiabetic range.  We'll follow-up in 6 months Hypertension, well-controlled Obesity.  Improved  Follow-up 3-6 months  Nyoka Cowden

## 2016-12-04 DIAGNOSIS — N403 Nodular prostate with lower urinary tract symptoms: Secondary | ICD-10-CM | POA: Diagnosis not present

## 2016-12-04 DIAGNOSIS — R3912 Poor urinary stream: Secondary | ICD-10-CM | POA: Diagnosis not present

## 2016-12-22 ENCOUNTER — Other Ambulatory Visit: Payer: Self-pay | Admitting: Internal Medicine

## 2016-12-25 ENCOUNTER — Encounter: Payer: Self-pay | Admitting: Internal Medicine

## 2016-12-25 DIAGNOSIS — N419 Inflammatory disease of prostate, unspecified: Secondary | ICD-10-CM | POA: Diagnosis not present

## 2016-12-25 DIAGNOSIS — N402 Nodular prostate without lower urinary tract symptoms: Secondary | ICD-10-CM | POA: Diagnosis not present

## 2017-01-18 ENCOUNTER — Other Ambulatory Visit: Payer: Self-pay | Admitting: Internal Medicine

## 2017-03-22 ENCOUNTER — Other Ambulatory Visit: Payer: Self-pay | Admitting: Internal Medicine

## 2017-03-26 ENCOUNTER — Other Ambulatory Visit: Payer: Self-pay | Admitting: Internal Medicine

## 2017-05-26 ENCOUNTER — Encounter: Payer: BLUE CROSS/BLUE SHIELD | Admitting: Internal Medicine

## 2017-05-31 ENCOUNTER — Other Ambulatory Visit: Payer: Self-pay | Admitting: Internal Medicine

## 2017-06-10 ENCOUNTER — Encounter: Payer: BLUE CROSS/BLUE SHIELD | Admitting: Internal Medicine

## 2017-06-12 DIAGNOSIS — N402 Nodular prostate without lower urinary tract symptoms: Secondary | ICD-10-CM | POA: Diagnosis not present

## 2017-06-12 DIAGNOSIS — E291 Testicular hypofunction: Secondary | ICD-10-CM | POA: Diagnosis not present

## 2017-06-12 LAB — PSA

## 2017-06-15 ENCOUNTER — Encounter: Payer: BLUE CROSS/BLUE SHIELD | Admitting: Internal Medicine

## 2017-06-18 ENCOUNTER — Ambulatory Visit (INDEPENDENT_AMBULATORY_CARE_PROVIDER_SITE_OTHER): Payer: BLUE CROSS/BLUE SHIELD | Admitting: Internal Medicine

## 2017-06-18 ENCOUNTER — Encounter: Payer: Self-pay | Admitting: Internal Medicine

## 2017-06-18 VITALS — BP 142/70 | HR 78 | Temp 98.5°F | Ht 67.5 in | Wt 245.8 lb

## 2017-06-18 DIAGNOSIS — Z86010 Personal history of colonic polyps: Secondary | ICD-10-CM | POA: Diagnosis not present

## 2017-06-18 DIAGNOSIS — Z23 Encounter for immunization: Secondary | ICD-10-CM | POA: Diagnosis not present

## 2017-06-18 DIAGNOSIS — I10 Essential (primary) hypertension: Secondary | ICD-10-CM

## 2017-06-18 DIAGNOSIS — Z0000 Encounter for general adult medical examination without abnormal findings: Secondary | ICD-10-CM | POA: Diagnosis not present

## 2017-06-18 DIAGNOSIS — E119 Type 2 diabetes mellitus without complications: Secondary | ICD-10-CM

## 2017-06-18 LAB — LIPID PANEL
CHOL/HDL RATIO: 5
Cholesterol: 197 mg/dL (ref 0–200)
HDL: 37.2 mg/dL — AB (ref >39.00)
LDL Cholesterol: 130 mg/dL — ABNORMAL HIGH (ref 0–99)
NONHDL: 159.5
Triglycerides: 148 mg/dL (ref 0.0–149.0)
VLDL: 29.6 mg/dL (ref 0.0–40.0)

## 2017-06-18 LAB — CBC WITH DIFFERENTIAL/PLATELET
BASOS PCT: 0.4 % (ref 0.0–3.0)
Basophils Absolute: 0 10*3/uL (ref 0.0–0.1)
EOS PCT: 1.4 % (ref 0.0–5.0)
Eosinophils Absolute: 0.1 10*3/uL (ref 0.0–0.7)
HEMATOCRIT: 49.2 % (ref 39.0–52.0)
Hemoglobin: 16.9 g/dL (ref 13.0–17.0)
LYMPHS ABS: 2.3 10*3/uL (ref 0.7–4.0)
LYMPHS PCT: 45.6 % (ref 12.0–46.0)
MCHC: 34.3 g/dL (ref 30.0–36.0)
MCV: 89 fl (ref 78.0–100.0)
MONOS PCT: 8.4 % (ref 3.0–12.0)
Monocytes Absolute: 0.4 10*3/uL (ref 0.1–1.0)
NEUTROS ABS: 2.2 10*3/uL (ref 1.4–7.7)
Neutrophils Relative %: 44.2 % (ref 43.0–77.0)
PLATELETS: 204 10*3/uL (ref 150.0–400.0)
RBC: 5.53 Mil/uL (ref 4.22–5.81)
RDW: 13.8 % (ref 11.5–15.5)
WBC: 5 10*3/uL (ref 4.0–10.5)

## 2017-06-18 LAB — COMPREHENSIVE METABOLIC PANEL
ALT: 16 U/L (ref 0–53)
AST: 17 U/L (ref 0–37)
Albumin: 4.3 g/dL (ref 3.5–5.2)
Alkaline Phosphatase: 57 U/L (ref 39–117)
BUN: 14 mg/dL (ref 6–23)
CO2: 29 meq/L (ref 19–32)
Calcium: 9.6 mg/dL (ref 8.4–10.5)
Chloride: 101 mEq/L (ref 96–112)
Creatinine, Ser: 0.75 mg/dL (ref 0.40–1.50)
GFR: 137.92 mL/min (ref >60.00)
GLUCOSE: 86 mg/dL (ref 70–99)
POTASSIUM: 4 meq/L (ref 3.5–5.1)
SODIUM: 138 meq/L (ref 135–145)
TOTAL PROTEIN: 7.6 g/dL (ref 6.0–8.3)
Total Bilirubin: 1.4 mg/dL — ABNORMAL HIGH (ref 0.2–1.2)

## 2017-06-18 LAB — HEMOGLOBIN A1C: Hgb A1c MFr Bld: 5.5 % (ref 4.6–6.5)

## 2017-06-18 LAB — MICROALBUMIN / CREATININE URINE RATIO
CREATININE, U: 62.8 mg/dL
MICROALB/CREAT RATIO: 62.4 mg/g — AB (ref 0.0–30.0)
Microalb, Ur: 39.2 mg/dL — ABNORMAL HIGH (ref 0.0–1.9)

## 2017-06-18 LAB — TSH: TSH: 0.77 u[IU]/mL (ref 0.35–4.50)

## 2017-06-18 MED ORDER — ATORVASTATIN CALCIUM 20 MG PO TABS: 20.0000 mg | ORAL_TABLET | Freq: Every day | ORAL | 3 refills | Status: DC

## 2017-06-18 NOTE — Patient Instructions (Addendum)
Limit your sodium (Salt) intake   Please check your hemoglobin A1c every 3-6 months    It is important that you exercise regularly, at least 20 minutes 3 to 4 times per week.  If you develop chest pain or shortness of breath seek  medical attention.  He appears well, in no apparent distress.  Alert and oriented times three, pleasant and cooperative. Vital signs are as documented in vital signs section.  Return in 6 months for follow-up  Please see your eye doctor yearly to check for diabetic eye damage  Avoids foods high in acid such as tomatoes citrus juices, and spicy foods.  Avoid eating within two hours of lying down or before exercising.  Do not overheat.  Try smaller more frequent meals.  If you remain symptom-free, challenge himself off Prevacid therapy

## 2017-06-18 NOTE — Progress Notes (Signed)
Subjective:    Patient ID: Robert Mcguire, male    DOB: 05-Dec-1960, 56 y.o.   MRN: [id number]  HPI  56 year old patient who is seen today for a preventive health examination. He has a history of type 2 diabetes which has been very well controlled on therapy.  Since he was diagnosed about 13 months ago he has had a significant weight loss.  He feels quite well He has been followed by urology and in June of this year had a prostate biopsy performed. He has a history of essential hypertension.  He has gastroesophageal reflux disease.  He has testosterone deficiency. He is followed by ophthalmology twice annually and is a glaucoma suspect. No statin therapy.  He does have a history of exogenous obesity.  Family history father died within the last 61 months of complications of diabetes at age 12.  Mother has dementia at age 70 2 brothers one sister positive for diabetes  Past Medical History:  Diagnosis Date  . Allergic rhinitis   . Allergy   . ED (erectile dysfunction)   . GERD (gastroesophageal reflux disease)   . Hypertension   . Impaired glucose tolerance   . Nephrolithiasis   . Obesity      Social History   Socioeconomic History  . Marital status: Married    Spouse name: Not on file  . Number of children: Not on file  . Years of education: Not on file  . Highest education level: Not on file  Social Needs  . Financial resource strain: Not on file  . Food insecurity - worry: Not on file  . Food insecurity - inability: Not on file  . Transportation needs - medical: Not on file  . Transportation needs - non-medical: Not on file  Occupational History  . Not on file  Tobacco Use  . Smoking status: Never Smoker  . Smokeless tobacco: Never Used  Substance and Sexual Activity  . Alcohol use: Yes    Alcohol/week: 0.0 oz    Comment: occasionally  . Drug use: No  . Sexual activity: Not on file  Other Topics Concern  . Not on file  Social History Narrative  . Not on file     Past Surgical History:  Procedure Laterality Date  . COLONOSCOPY    . difficultity with intabation    . POLYPECTOMY    . right achilles tendon surgery      Family History  Problem Relation Age of Onset  . Hypertension Mother   . Obesity Father   . Diabetes Father   . Hypertension Father   . Colon cancer Father 84  . Colon polyps Father   . Diabetes Other   . Hypertension Other   . Breast cancer Other        breast, colon   . Stomach cancer Other        paternal great grand father  . Healthy Sister   . Hypertension Brother   . Colon polyps Brother   . Hypertension Maternal Grandmother   . Hypertension Brother   . Esophageal cancer Neg Hx   . Rectal cancer Neg Hx     No Known Allergies  Current Outpatient Medications on File Prior to Visit  Medication Sig Dispense Refill  . amLODipine (NORVASC) 10 MG tablet TAKE 1 TABLET DAILY 90 tablet 3  . benazepril (LOTENSIN) 40 MG tablet TAKE 1 TABLET DAILY 90 tablet 3  . INVOKANA 300 MG TABS tablet TAKE 1 TABLET BY MOUTH EVERY  DAY BEFORE THE FIRST MEAL OF THE DAY 30 tablet 3  . lansoprazole (PREVACID) 30 MG capsule Take 1 capsule (30 mg total) by mouth daily. 90 capsule 1  . lansoprazole (PREVACID) 30 MG capsule TAKE 1 CAPSULE DAILY 90 capsule 1  . latanoprost (XALATAN) 0.005 % ophthalmic solution     . meloxicam (MOBIC) 15 MG tablet Take 1 tablet (15 mg total) by mouth daily. 30 tablet 0  . metoprolol succinate (TOPROL-XL) 100 MG 24 hr tablet TAKE 1 TABLET DAILY WITH ORIMMEDIATELY FOLLOWING A    MEAL 90 tablet 1  . ondansetron (ZOFRAN) 4 MG tablet Take 1 tablet (4 mg total) by mouth every 6 (six) hours as needed for nausea. 12 tablet 0  . tadalafil (CIALIS) 20 MG tablet Take 0.5-1 tablets (10-20 mg total) by mouth every other day as needed for erectile dysfunction. 5 tablet 11  . Testosterone (ANDROGEL PUMP) 20.25 MG/ACT (1.62%) GEL Place 2 application onto the skin every morning. 75 g 5  . vardenafil (LEVITRA) 10 MG tablet  Take 10 mg by mouth daily as needed.    . metFORMIN (GLUCOPHAGE-XR) 500 MG 24 hr tablet TAKE 2 TABLETS BY MOUTH EVERY DAY WITH BREAKFAST 180 tablet 2  . metFORMIN (GLUMETZA) 500 MG (MOD) 24 hr tablet Take 2 tablets (1,000 mg total) by mouth daily with breakfast. 180 tablet 6   No current facility-administered medications on file prior to visit.     BP (!) 142/70 (BP Location: Left Arm, Patient Position: Sitting, Cuff Size: Large)   Pulse 78   Temp 98.5 F (36.9 C) (Oral)   Ht 5' 7.5" (1.715 m)   Wt 245 lb 12.8 oz (111.5 kg)   SpO2 97%   BMI 37.93 kg/m     Review of Systems  Constitutional: Negative for appetite change, chills, fatigue and fever.  HENT: Negative for congestion, dental problem, ear pain, hearing loss, sore throat, tinnitus, trouble swallowing and voice change.   Eyes: Negative for pain, discharge and visual disturbance.  Respiratory: Negative for cough, chest tightness, wheezing and stridor.   Cardiovascular: Negative for chest pain, palpitations and leg swelling.  Gastrointestinal: Negative for abdominal distention, abdominal pain, blood in stool, constipation, diarrhea, nausea and vomiting.  Genitourinary: Negative for difficulty urinating, discharge, flank pain, genital sores, hematuria and urgency.  Musculoskeletal: Negative for arthralgias, back pain, gait problem, joint swelling, myalgias and neck stiffness.  Skin: Negative for rash.  Neurological: Negative for dizziness, syncope, speech difficulty, weakness, numbness and headaches.  Hematological: Negative for adenopathy. Does not bruise/bleed easily.  Psychiatric/Behavioral: Negative for behavioral problems and dysphoric mood. The patient is not nervous/anxious.        Objective:   Physical Exam  Constitutional: He appears well-developed and well-nourished.  Weight 245 Blood pressure 130/70  HENT:  Head: Normocephalic and atraumatic.  Right Ear: External ear normal.  Left Ear: External ear normal.   Nose: Nose normal.  Mouth/Throat: Oropharynx is clear and moist.  Eyes: Conjunctivae and EOM are normal. Pupils are equal, round, and reactive to light. No scleral icterus.  Neck: Normal range of motion. Neck supple. No JVD present. No thyromegaly present.  Cardiovascular: Regular rhythm, normal heart sounds and intact distal pulses. Exam reveals no gallop and no friction rub.  No murmur heard. Pulmonary/Chest: Effort normal and breath sounds normal. He exhibits no tenderness.  Abdominal: Soft. Bowel sounds are normal. He exhibits no distension and no mass. There is no tenderness.  Genitourinary: Penis normal.  Genitourinary Comments: Prostate exam  deferred.  Followed by urology.  Recent prostate biopsy  Musculoskeletal: Normal range of motion. He exhibits no edema or tenderness.  Lymphadenopathy:    He has no cervical adenopathy.  Neurological: He is alert. He has normal reflexes. No cranial nerve deficit. Coordination normal.  Skin: Skin is warm and dry. No rash noted.  Psychiatric: He has a normal mood and affect. His behavior is normal.          Assessment & Plan:  Preventive health examination  Diabetes mellitus.  Hemoglobin A1c's have been well controlled.  Recheck today. Mild dyslipidemia.  Will review a lipid profile but benefits of statin therapy discussed.  Will start atorvastatin 20 Essential hypertension stable  Urology follow-up Ophthalmology follow-up Return here in 6 months  [place]

## 2017-06-19 ENCOUNTER — Other Ambulatory Visit: Payer: Self-pay | Admitting: Internal Medicine

## 2017-06-23 ENCOUNTER — Encounter: Payer: Self-pay | Admitting: Internal Medicine

## 2017-06-23 DIAGNOSIS — H401131 Primary open-angle glaucoma, bilateral, mild stage: Secondary | ICD-10-CM | POA: Diagnosis not present

## 2017-09-08 DIAGNOSIS — E291 Testicular hypofunction: Secondary | ICD-10-CM | POA: Diagnosis not present

## 2017-09-15 ENCOUNTER — Other Ambulatory Visit: Payer: Self-pay | Admitting: Internal Medicine

## 2017-09-16 DIAGNOSIS — E291 Testicular hypofunction: Secondary | ICD-10-CM | POA: Diagnosis not present

## 2017-09-16 DIAGNOSIS — N5201 Erectile dysfunction due to arterial insufficiency: Secondary | ICD-10-CM | POA: Diagnosis not present

## 2017-09-16 DIAGNOSIS — R351 Nocturia: Secondary | ICD-10-CM | POA: Diagnosis not present

## 2017-09-21 ENCOUNTER — Telehealth: Payer: Self-pay | Admitting: *Deleted

## 2017-09-21 NOTE — Telephone Encounter (Signed)
Prior auth for Invokana 300mg  sent to [id number].

## 2017-09-22 NOTE — Telephone Encounter (Signed)
Fax received from [facility] stating the request was denied and this was given to Dr Truddie Hidden asst.

## 2017-09-23 ENCOUNTER — Encounter: Payer: Self-pay | Admitting: Internal Medicine

## 2017-11-05 DIAGNOSIS — H401131 Primary open-angle glaucoma, bilateral, mild stage: Secondary | ICD-10-CM | POA: Diagnosis not present

## 2017-11-06 DIAGNOSIS — H401131 Primary open-angle glaucoma, bilateral, mild stage: Secondary | ICD-10-CM | POA: Diagnosis not present

## 2017-11-06 LAB — HM DIABETES EYE EXAM

## 2017-11-12 ENCOUNTER — Encounter: Payer: Self-pay | Admitting: Internal Medicine

## 2017-11-12 DIAGNOSIS — K7460 Unspecified cirrhosis of liver: Secondary | ICD-10-CM | POA: Diagnosis not present

## 2017-11-12 DIAGNOSIS — I8500 Esophageal varices without bleeding: Secondary | ICD-10-CM | POA: Diagnosis not present

## 2017-12-25 ENCOUNTER — Other Ambulatory Visit: Payer: Self-pay | Admitting: Internal Medicine

## 2017-12-30 ENCOUNTER — Encounter: Payer: Self-pay | Admitting: Internal Medicine

## 2017-12-30 ENCOUNTER — Ambulatory Visit (INDEPENDENT_AMBULATORY_CARE_PROVIDER_SITE_OTHER): Payer: BLUE CROSS/BLUE SHIELD | Admitting: Internal Medicine

## 2017-12-30 VITALS — BP 140/82 | Temp 98.2°F | Wt 252.0 lb

## 2017-12-30 DIAGNOSIS — E119 Type 2 diabetes mellitus without complications: Secondary | ICD-10-CM | POA: Diagnosis not present

## 2017-12-30 DIAGNOSIS — I10 Essential (primary) hypertension: Secondary | ICD-10-CM | POA: Diagnosis not present

## 2017-12-30 DIAGNOSIS — Z86010 Personal history of colonic polyps: Secondary | ICD-10-CM | POA: Diagnosis not present

## 2017-12-30 LAB — POCT GLYCOSYLATED HEMOGLOBIN (HGB A1C): HEMOGLOBIN A1C: 5.6 % (ref 4.0–5.6)

## 2017-12-30 MED ORDER — SILDENAFIL CITRATE 100 MG PO TABS: 100.0000 mg | ORAL_TABLET | Freq: Every day | ORAL | 6 refills | Status: DC | PRN

## 2017-12-30 MED ORDER — EMPAGLIFLOZIN 25 MG PO TABS: 25.0000 mg | ORAL_TABLET | Freq: Every day | ORAL | 4 refills | Status: DC

## 2017-12-30 MED ORDER — BENAZEPRIL HCL 40 MG PO TABS: 40.0000 mg | ORAL_TABLET | Freq: Every day | ORAL | 3 refills | Status: DC

## 2017-12-30 MED ORDER — AMLODIPINE BESYLATE 10 MG PO TABS: 10.0000 mg | ORAL_TABLET | Freq: Every day | ORAL | 3 refills | Status: DC

## 2017-12-30 MED ORDER — ATORVASTATIN CALCIUM 40 MG PO TABS: 40.0000 mg | ORAL_TABLET | Freq: Every day | ORAL | 4 refills | Status: DC

## 2017-12-30 MED ORDER — METFORMIN HCL ER 500 MG PO TB24: ORAL_TABLET | ORAL | 2 refills | Status: DC

## 2017-12-30 MED ORDER — METOPROLOL SUCCINATE ER 100 MG PO TB24: ORAL_TABLET | ORAL | 4 refills | Status: DC

## 2017-12-30 MED ORDER — LANSOPRAZOLE 30 MG PO CPDR: 30.0000 mg | DELAYED_RELEASE_CAPSULE | Freq: Every day | ORAL | 1 refills | Status: DC

## 2017-12-30 NOTE — Addendum Note (Signed)
Addended by: Gwenyth Ober R on: 12/30/2017 10:22 AM   Modules accepted: Orders

## 2017-12-30 NOTE — Progress Notes (Signed)
Subjective:    Patient ID: Robert Mcguire, male    DOB: [birth year], 57 y.o.   MRN: [id number]  HPI  Lab Results  Component Value Date   HGBA1C 5.5 06/18/2017   57 year old patient who is seen today for follow-up of type 2 diabetes.  He has essential hypertension. He has a history of obesity and weight is up about 7 pounds over the past 6 months.  Otherwise doing well without concerns or complaints Denies any cardiopulmonary complaints Needs refills on his medications.  He states he is no longer taking testosterone supplementation  Past Medical History:  Diagnosis Date  . Allergic rhinitis   . Allergy   . ED (erectile dysfunction)   . GERD (gastroesophageal reflux disease)   . Hypertension   . Impaired glucose tolerance   . Nephrolithiasis   . Obesity      Social History   Socioeconomic History  . Marital status: Married    Spouse name: Not on file  . Number of children: Not on file  . Years of education: Not on file  . Highest education level: Not on file  Occupational History  . Not on file  Social Needs  . Financial resource strain: Not on file  . Food insecurity:    Worry: Not on file    Inability: Not on file  . Transportation needs:    Medical: Not on file    Non-medical: Not on file  Tobacco Use  . Smoking status: Never Smoker  . Smokeless tobacco: Never Used  Substance and Sexual Activity  . Alcohol use: Yes    Alcohol/week: 0.0 oz    Comment: occasionally  . Drug use: No  . Sexual activity: Not on file  Lifestyle  . Physical activity:    Days per week: Not on file    Minutes per session: Not on file  . Stress: Not on file  Relationships  . Social connections:    Talks on phone: Not on file    Gets together: Not on file    Attends religious service: Not on file    Active member of club or organization: Not on file    Attends meetings of clubs or organizations: Not on file    Relationship status: Not on file  . Intimate partner violence:   Fear of current or ex partner: Not on file    Emotionally abused: Not on file    Physically abused: Not on file    Forced sexual activity: Not on file  Other Topics Concern  . Not on file  Social History Narrative  . Not on file    Past Surgical History:  Procedure Laterality Date  . COLONOSCOPY    . difficultity with intabation    . POLYPECTOMY    . right achilles tendon surgery      Family History  Problem Relation Age of Onset  . Hypertension Mother   . Obesity Father   . Diabetes Father   . Hypertension Father   . Colon cancer Father 43  . Colon polyps Father   . Diabetes Other   . Hypertension Other   . Breast cancer Other        breast, colon   . Stomach cancer Other        paternal great grand father  . Healthy Sister   . Hypertension Brother   . Colon polyps Brother   . Hypertension Maternal Grandmother   . Hypertension Brother   . Esophageal cancer  Neg Hx   . Rectal cancer Neg Hx     No Known Allergies  Current Outpatient Medications on File Prior to Visit  Medication Sig Dispense Refill  . amLODipine (NORVASC) 10 MG tablet TAKE 1 TABLET DAILY 90 tablet 3  . atorvastatin (LIPITOR) 20 MG tablet Take 1 tablet (20 mg total) by mouth daily. 90 tablet 3  . benazepril (LOTENSIN) 40 MG tablet TAKE 1 TABLET DAILY 90 tablet 3  . INVOKANA 300 MG TABS tablet TAKE 1 TABLET BY MOUTH EVERY DAY BEFORE THE FIRST MEAL OF THE DAY 90 tablet 2  . JARDIANCE 25 MG TABS tablet TAKE 1 TABLET BY MOUTH EVERY DAY 90 tablet 0  . lansoprazole (PREVACID) 30 MG capsule Take 1 capsule (30 mg total) by mouth daily. 90 capsule 1  . latanoprost (XALATAN) 0.005 % ophthalmic solution     . metFORMIN (GLUCOPHAGE-XR) 500 MG 24 hr tablet TAKE 2 TABLETS BY MOUTH EVERY DAY WITH BREAKFAST 180 tablet 2  . metoprolol succinate (TOPROL-XL) 100 MG 24 hr tablet TAKE 1 TABLET DAILY WITH ORIMMEDIATELY FOLLOWING A    MEAL 90 tablet 1  . Testosterone (ANDROGEL PUMP) 20.25 MG/ACT (1.62%) GEL Place 2  application onto the skin every morning. 75 g 5  . vardenafil (LEVITRA) 10 MG tablet Take 10 mg by mouth daily as needed.     No current facility-administered medications on file prior to visit.     BP 140/82 (BP Location: Right Arm, Patient Position: Sitting, Cuff Size: Large)   Temp 98.2 F (36.8 C) (Oral)   Wt 252 lb (114.3 kg)   BMI 38.89 kg/m    Review of Systems  Constitutional: Positive for unexpected weight change. Negative for appetite change, chills, fatigue and fever.  HENT: Negative for congestion, dental problem, ear pain, hearing loss, sore throat, tinnitus, trouble swallowing and voice change.   Eyes: Negative for pain, discharge and visual disturbance.  Respiratory: Negative for cough, chest tightness, wheezing and stridor.   Cardiovascular: Negative for chest pain, palpitations and leg swelling.  Gastrointestinal: Negative for abdominal distention, abdominal pain, blood in stool, constipation, diarrhea, nausea and vomiting.  Genitourinary: Negative for difficulty urinating, discharge, flank pain, genital sores, hematuria and urgency.  Musculoskeletal: Negative for arthralgias, back pain, gait problem, joint swelling, myalgias and neck stiffness.  Skin: Negative for rash.  Neurological: Negative for dizziness, syncope, speech difficulty, weakness, numbness and headaches.  Hematological: Negative for adenopathy. Does not bruise/bleed easily.  Psychiatric/Behavioral: Negative for behavioral problems and dysphoric mood. The patient is not nervous/anxious.        Objective:   Physical Exam  Constitutional: He is oriented to person, place, and time. He appears well-developed.  Weight  252 Blood pressure 134/80   HENT:  Head: Normocephalic.  Right Ear: External ear normal.  Left Ear: External ear normal.  Eyes: Conjunctivae and EOM are normal.  Neck: Normal range of motion.  Cardiovascular: Normal rate and normal heart sounds.  Pulmonary/Chest: Breath sounds  normal.  Abdominal: Bowel sounds are normal.  Musculoskeletal: Normal range of motion. He exhibits no edema or tenderness.  Neurological: He is alert and oriented to person, place, and time.  Skin: No rash noted.  Psychiatric: He has a normal mood and affect. His behavior is normal.          Assessment & Plan:   Diabetes mellitus.  Hemoglobin A1c 5.6.  No change in medical regimen Obesity/weight gain.  Weight loss exercise encouraged Essential hypertension continue observe on present regimen  Dyslipidemia continue statin therapy.  Increase atorvastatin to 40 mg daily  Follow-up/CPX 6 months with new provider All medications refilled  Marletta Lor

## 2017-12-30 NOTE — Patient Instructions (Signed)
You need to lose weight.  Consider a lower calorie diet and regular exercise.   Please check your hemoglobin A1c every 3-6  Months    It is important that you exercise regularly, at least 20 minutes 3 to 4 times per week.  If you develop chest pain or shortness of breath seek  medical attention.  Please see your eye doctor yearly to check for diabetic eye damage

## 2018-02-11 DIAGNOSIS — N402 Nodular prostate without lower urinary tract symptoms: Secondary | ICD-10-CM | POA: Diagnosis not present

## 2018-02-11 DIAGNOSIS — E291 Testicular hypofunction: Secondary | ICD-10-CM | POA: Diagnosis not present

## 2018-02-18 DIAGNOSIS — N5201 Erectile dysfunction due to arterial insufficiency: Secondary | ICD-10-CM | POA: Diagnosis not present

## 2018-02-18 DIAGNOSIS — N402 Nodular prostate without lower urinary tract symptoms: Secondary | ICD-10-CM | POA: Diagnosis not present

## 2018-02-18 DIAGNOSIS — E291 Testicular hypofunction: Secondary | ICD-10-CM | POA: Diagnosis not present

## 2018-03-11 DIAGNOSIS — K115 Sialolithiasis: Secondary | ICD-10-CM | POA: Diagnosis not present

## 2018-05-05 DIAGNOSIS — N62 Hypertrophy of breast: Secondary | ICD-10-CM | POA: Diagnosis not present

## 2018-05-06 DIAGNOSIS — H401131 Primary open-angle glaucoma, bilateral, mild stage: Secondary | ICD-10-CM | POA: Diagnosis not present

## 2018-06-17 ENCOUNTER — Ambulatory Visit (INDEPENDENT_AMBULATORY_CARE_PROVIDER_SITE_OTHER): Payer: BLUE CROSS/BLUE SHIELD | Admitting: Family Medicine

## 2018-06-17 ENCOUNTER — Encounter: Payer: Self-pay | Admitting: Family Medicine

## 2018-06-17 VITALS — BP 158/82 | HR 62 | Temp 97.6°F | Wt 242.0 lb

## 2018-06-17 DIAGNOSIS — Z23 Encounter for immunization: Secondary | ICD-10-CM

## 2018-06-17 DIAGNOSIS — N62 Hypertrophy of breast: Secondary | ICD-10-CM

## 2018-06-17 DIAGNOSIS — K219 Gastro-esophageal reflux disease without esophagitis: Secondary | ICD-10-CM

## 2018-06-17 DIAGNOSIS — E119 Type 2 diabetes mellitus without complications: Secondary | ICD-10-CM

## 2018-06-17 DIAGNOSIS — I10 Essential (primary) hypertension: Secondary | ICD-10-CM

## 2018-06-17 NOTE — Progress Notes (Signed)
Subjective:    Patient ID: Robert Mcguire, male    DOB: Feb 01, 1961, 57 y.o.   MRN: [id number]  No chief complaint on file.   HPI Patient was seen today for follow-up on chronic conditions and TOC, previously seen by Dr. Burnice Logan.  HTN: -Patient on benazepril 40 mg daily, Toprol-XL 100 mg, Norvasc 10 mg -just took BP meds 5 mins ago. -Occasionally checks BP at home.  140s/70-80s -Walking for exercise, drinking at least 4 to 80 ounces of water per day. -Not adding extra salt to food -Taking Lipitor 40 mg for HLD  GERD: -Denies any issues since weight loss -Not having to take Prevacid 30 mg  Type II DM: -Taking Jardiance 25 mg and metformin XR 1000 mg every morning -Occasionally checks FSBS at home, typically 115-117 -Negative diabetic eye exam 3 months ago. -no glaucoma noted, however on latanoprost eye gtts  Gynecomastia: -notes ongoing history -Scheduled to have surgery in January. -We will need forms completed in the next few weeks  Past Medical History:  Diagnosis Date  . Allergic rhinitis   . Allergy   . ED (erectile dysfunction)   . GERD (gastroesophageal reflux disease)   . Hypertension   . Impaired glucose tolerance   . Nephrolithiasis   . Obesity     No Known Allergies  ROS General: Denies fever, chills, night sweats, changes in weight, changes in appetite HEENT: Denies headaches, ear pain, changes in vision, rhinorrhea, sore throat CV: Denies CP, palpitations, SOB, orthopnea Pulm: Denies SOB, cough, wheezing GI: Denies abdominal pain, nausea, vomiting, diarrhea, constipation GU: Denies dysuria, hematuria, frequency, vaginal discharge Msk: Denies muscle cramps, joint pains Neuro: Denies weakness, numbness, tingling Skin: Denies rashes, bruising + gynecomastia Psych: Denies depression, anxiety, hallucinations   Objective:    Blood pressure (!) 158/82, pulse 62, temperature 97.6 F (36.4 C), temperature source Oral, weight 242 lb (109.8 kg), SpO2 97  %.  Gen. Pleasant, well-nourished, in no distress, normal affect   HEENT: [place]/AT, face symmetric, no scleral icterus, PERRLA, nares patent without drainage, pharynx without erythema or exudate.  TMs normal bilaterally. Lungs: no accessory muscle use, CTAB, no wheezes or rales Cardiovascular: RRR, no m/r/g, no peripheral edema Neuro:  A&Ox3, CN II-XII intact, normal gait Skin:  Warm, no lesions/ rash.  Gynecomastia b/l  Wt Readings from Last 3 Encounters:  06/17/18 242 lb (109.8 kg)  12/30/17 252 lb (114.3 kg)  06/18/17 245 lb 12.8 oz (111.5 kg)    Lab Results  Component Value Date   WBC 5.0 06/18/2017   HGB 16.9 06/18/2017   HCT 49.2 06/18/2017   PLT 204.0 06/18/2017   GLUCOSE 86 06/18/2017   CHOL 197 06/18/2017   TRIG 148.0 06/18/2017   HDL 37.20 (L) 06/18/2017   LDLDIRECT 168.0 05/13/2016   LDLCALC 130 (H) 06/18/2017   ALT 16 06/18/2017   AST 17 06/18/2017   NA 138 06/18/2017   K 4.0 06/18/2017   CL 101 06/18/2017   CREATININE 0.75 06/18/2017   BUN 14 06/18/2017   CO2 29 06/18/2017   TSH 0.77 06/18/2017   PSA 1.25ng/ml 06/12/2017   HGBA1C 5.6 12/30/2017   MICROALBUR 39.2 (H) 06/18/2017    Assessment/Plan:  Essential hypertension -Elevated -Continue Norvasc 10 mg, benazepril 40 mg, Toprol-XL 100 mg daily -Discuss check home BP throughout the day and keeping a log to bring with him to next appointment -Discussed lifestyle modifications -If BP continues to be elevated we will need to adjust medications.  Gastroesophageal reflux disease, esophagitis  presence not specified -Resolved -Continue lifestyle modifications including weight loss  Type 2 diabetes mellitus without complication, without long-term current use of insulin (HCC) -Controlled -Last hemoglobin A1c 5.6% on 12/30/2017 -Continue Jardiance 25 mg and metformin XR 1000 mg daily -Discussed lifestyle modifications including decreasing the amount of carbs and sweets eaten  Gynecomastia -Continue  following with plastics in regards to surgery -Pt advised to bring forms to office for completion next week  Need for immunization against influenza  - Plan: Flu Vaccine QUAD 36+ mos IM  Follow-up next week for CPE and labs  Grier Mitts, MD

## 2018-06-17 NOTE — Patient Instructions (Signed)
Managing Your Hypertension Hypertension is commonly called high blood pressure. This is when the force of your blood pressing against the walls of your arteries is too strong. Arteries are blood vessels that carry blood from your heart throughout your body. Hypertension forces the heart to work harder to pump blood, and may cause the arteries to become narrow or stiff. Having untreated or uncontrolled hypertension can cause heart attack, stroke, kidney disease, and other problems. What are blood pressure readings? A blood pressure reading consists of a higher number over a lower number. Ideally, your blood pressure should be below 120/80. The first ("top") number is called the systolic pressure. It is a measure of the pressure in your arteries as your heart beats. The second ("bottom") number is called the diastolic pressure. It is a measure of the pressure in your arteries as the heart relaxes. What does my blood pressure reading mean? Blood pressure is classified into four stages. Based on your blood pressure reading, your health care provider may use the following stages to determine what type of treatment you need, if any. Systolic pressure and diastolic pressure are measured in a unit called mm Hg. Normal  Systolic pressure: below 120.  Diastolic pressure: below 80. Elevated  Systolic pressure: 120-129.  Diastolic pressure: below 80. Hypertension stage 1  Systolic pressure: 130-139.  Diastolic pressure: 80-89. Hypertension stage 2  Systolic pressure: 140 or above.  Diastolic pressure: 90 or above. What health risks are associated with hypertension? Managing your hypertension is an important responsibility. Uncontrolled hypertension can lead to:  A heart attack.  A stroke.  A weakened blood vessel (aneurysm).  Heart failure.  Kidney damage.  Eye damage.  Metabolic syndrome.  Memory and concentration problems.  What changes can I make to manage my  hypertension? Hypertension can be managed by making lifestyle changes and possibly by taking medicines. Your health care provider will help you make a plan to bring your blood pressure within a normal range. Eating and drinking  Eat a diet that is high in fiber and potassium, and low in salt (sodium), added sugar, and fat. An example eating plan is called the DASH (Dietary Approaches to Stop Hypertension) diet. To eat this way: ? Eat plenty of fresh fruits and vegetables. Try to fill half of your plate at each meal with fruits and vegetables. ? Eat whole grains, such as whole wheat pasta, brown rice, or whole grain bread. Fill about one quarter of your plate with whole grains. ? Eat low-fat diary products. ? Avoid fatty cuts of meat, processed or cured meats, and poultry with skin. Fill about one quarter of your plate with lean proteins such as fish, chicken without skin, beans, eggs, and tofu. ? Avoid premade and processed foods. These tend to be higher in sodium, added sugar, and fat.  Reduce your daily sodium intake. Most people with hypertension should eat less than 1,500 mg of sodium a day.  Limit alcohol intake to no more than 1 drink a day for nonpregnant women and 2 drinks a day for men. One drink equals 12 oz of beer, 5 oz of wine, or 1 oz of hard liquor. Lifestyle  Work with your health care provider to maintain a healthy body weight, or to lose weight. Ask what an ideal weight is for you.  Get at least 30 minutes of exercise that causes your heart to beat faster (aerobic exercise) most days of the week. Activities may include walking, swimming, or biking.  Include exercise   to strengthen your muscles (resistance exercise), such as weight lifting, as part of your weekly exercise routine. Try to do these types of exercises for 30 minutes at least 3 days a week.  Do not use any products that contain nicotine or tobacco, such as cigarettes and e-cigarettes. If you need help quitting, ask  your health care provider.  Control any long-term (chronic) conditions you have, such as high cholesterol or diabetes. Monitoring  Monitor your blood pressure at home as told by your health care provider. Your personal target blood pressure may vary depending on your medical conditions, your age, and other factors.  Have your blood pressure checked regularly, as often as told by your health care provider. Working with your health care provider  Review all the medicines you take with your health care provider because there may be side effects or interactions.  Talk with your health care provider about your diet, exercise habits, and other lifestyle factors that may be contributing to hypertension.  Visit your health care provider regularly. Your health care provider can help you create and adjust your plan for managing hypertension. Will I need medicine to control my blood pressure? Your health care provider may prescribe medicine if lifestyle changes are not enough to get your blood pressure under control, and if:  Your systolic blood pressure is 130 or higher.  Your diastolic blood pressure is 80 or higher.  Take medicines only as told by your health care provider. Follow the directions carefully. Blood pressure medicines must be taken as prescribed. The medicine does not work as well when you skip doses. Skipping doses also puts you at risk for problems. Contact a health care provider if:  You think you are having a reaction to medicines you have taken.  You have repeated (recurrent) headaches.  You feel dizzy.  You have swelling in your ankles.  You have trouble with your vision. Get help right away if:  You develop a severe headache or confusion.  You have unusual weakness or numbness, or you feel faint.  You have severe pain in your chest or abdomen.  You vomit repeatedly.  You have trouble breathing. Summary  Hypertension is when the force of blood pumping through  your arteries is too strong. If this condition is not controlled, it may put you at risk for serious complications.  Your personal target blood pressure may vary depending on your medical conditions, your age, and other factors. For most people, a normal blood pressure is less than 120/80.  Hypertension is managed by lifestyle changes, medicines, or both. Lifestyle changes include weight loss, eating a healthy, low-sodium diet, exercising more, and limiting alcohol. This information is not intended to replace advice given to you by your health care provider. Make sure you discuss any questions you have with your health care provider. Document Released: 03/17/2012 Document Revised: 05/21/2016 Document Reviewed: 05/21/2016 Elsevier Interactive Patient Education  2018 [employer].  Gynecomastia, Adult Gynecomastia is an overgrowth of gland tissue in a man's breasts. This may cause one or both breasts to become enlarged. This often develops in men who have an imbalance of the male sex hormone (testosterone) and the male sex hormone (estrogen). This means that a man may have too much estrogen, too little testosterone, or both. Gynecomastia may be a normal part of aging for some men. It can also happen to adolescent boys during puberty. What are the causes? Gynecomastia may be caused by:  Certain medicines, such as: ? Estrogen supplements and  medicines that act like estrogen in the body. ? Medicines that keep testosterone from functioning normally in the body (testosterone-inhibiting drugs). ? Anabolic steroids. ? Medicines to treat heartburn, cancer, heart disease, mental health problems, HIV (human immunodeficiency virus) or AIDS (acquired immunodeficiency syndrome). ? Antibiotic medicine. ? Chemotherapy medicine.  Recreational drugs, including alcohol, marijuana, and opioids.  Herbal products, including lavender and tea tree oil.  A gene that is passed along from parent to child  (inherited).  Tumors in the pituitary or adrenal gland.  An overactive thyroid gland.  Certain inherited disorders, including a genetic disease that causes low testosterone in males (Klinefelter syndrome).  Cancer of the lung, kidney, liver, testicle, or gastrointestinal tract.  Conditions that cause liver or kidney failure.  Poor nutrition and starvation.  Testicle shrinking or failure (testicularatrophy).  In some cases, the cause may not be known. What increases the risk? You may have a higher risk for gynecomastia if you:  Are 86 years old or older.  Are overweight.  Abuse alcohol or other drugs.  Have a family history of gynecomastia.  What are the signs or symptoms?  Most of the time, breast enlargement is the only symptom. The enlargement may start near the nipple, and the breast tissue may feel firm and rubbery. The breast may feel itchy, painful or tender. How is this diagnosed? This condition may be diagnosed based on:  Your symptoms.  Your medical history.  A physical exam.  Imaging tests, such as: ? An ultrasound. ? A mammogram. ? An MRI.  Blood tests.  Removal of a sample of breast tissue to be tested in a lab (biopsy).  How is this treated? Gynecomastia may go away on its own, without treatment. If gynecomastia is caused by a medical problem or drug abuse, treatment may include:  Getting treatment for the underlying medical problem or for drug abuse.  Changing or stopping medicines.  Medicines to block the effects of estrogen.  Taking a testosterone replacement.  Surgery to remove breast tissue or any lumps in your breasts.  Breast reduction surgery. This may be a possibility if you have severe or painful gynecomastia.  Follow these instructions at home:  Take over-the-counter and prescription medicines only as told by your health care provider.  Talk to your health care provider before taking any herbal medicines or diet  supplements.  Do not abuse drugs or alcohol.  Keep all follow-up visits as told by your health care provider. This is important. Contact a health care provider if:  Your breast tissue grows larger or gets more swollen or painful.  You have a lump in your testicle.  You have blood or discharge coming from your nipples.  Your nipple changes shape.  You develop a hard or painful lump in your breast. This information is not intended to replace advice given to you by your health care provider. Make sure you discuss any questions you have with your health care provider. Document Released: 08/17/2015 Document Revised: 11/30/2015 Document Reviewed: 08/17/2015 Elsevier Interactive Patient Education  [employer].

## 2018-06-21 ENCOUNTER — Encounter: Payer: Self-pay | Admitting: Family Medicine

## 2018-06-21 ENCOUNTER — Ambulatory Visit (INDEPENDENT_AMBULATORY_CARE_PROVIDER_SITE_OTHER): Payer: BLUE CROSS/BLUE SHIELD | Admitting: Family Medicine

## 2018-06-21 ENCOUNTER — Other Ambulatory Visit: Payer: Self-pay | Admitting: Internal Medicine

## 2018-06-21 VITALS — BP 140/80 | HR 97 | Temp 97.9°F | Wt 237.0 lb

## 2018-06-21 DIAGNOSIS — Z0000 Encounter for general adult medical examination without abnormal findings: Secondary | ICD-10-CM

## 2018-06-21 DIAGNOSIS — E119 Type 2 diabetes mellitus without complications: Secondary | ICD-10-CM

## 2018-06-21 DIAGNOSIS — E782 Mixed hyperlipidemia: Secondary | ICD-10-CM

## 2018-06-21 DIAGNOSIS — I10 Essential (primary) hypertension: Secondary | ICD-10-CM

## 2018-06-21 DIAGNOSIS — N62 Hypertrophy of breast: Secondary | ICD-10-CM

## 2018-06-21 LAB — BASIC METABOLIC PANEL
BUN: 17 mg/dL (ref 6–23)
CO2: 24 meq/L (ref 19–32)
Calcium: 9.8 mg/dL (ref 8.4–10.5)
Chloride: 102 mEq/L (ref 96–112)
Creatinine, Ser: 0.73 mg/dL (ref 0.40–1.50)
GFR: 141.79 mL/min (ref >60.00)
GLUCOSE: 110 mg/dL — AB (ref 70–99)
POTASSIUM: 3.7 meq/L (ref 3.5–5.1)
SODIUM: 138 meq/L (ref 135–145)

## 2018-06-21 LAB — LIPID PANEL
CHOLESTEROL: 125 mg/dL (ref 0–200)
HDL: 37 mg/dL — AB (ref >39.00)
LDL Cholesterol: 73 mg/dL (ref 0–99)
NonHDL: 88.25
TRIGLYCERIDES: 76 mg/dL (ref 0.0–149.0)
Total CHOL/HDL Ratio: 3
VLDL: 15.2 mg/dL (ref 0.0–40.0)

## 2018-06-21 LAB — HEMOGLOBIN A1C: HEMOGLOBIN A1C: 5.4 % (ref 4.6–6.5)

## 2018-06-21 LAB — TSH: TSH: 1.01 u[IU]/mL (ref 0.35–4.50)

## 2018-06-21 NOTE — Patient Instructions (Signed)

## 2018-06-21 NOTE — Progress Notes (Signed)
Subjective:     Robert Mcguire is a 57 y.o. male and is here for a comprehensive physical exam. The patient reports no problems.  Recently seen by urology, h/o prostate biopsy and testosterone deficiency.  Also seen by ophthalmology every 3-4 months for glaucoma surveillance.  Influenza vaccine completed  Social History   Socioeconomic History  . Marital status: Married    Spouse name: Not on file  . Number of children: Not on file  . Years of education: Not on file  . Highest education level: Not on file  Occupational History  . Not on file  Social Needs  . Financial resource strain: Not on file  . Food insecurity:    Worry: Not on file    Inability: Not on file  . Transportation needs:    Medical: Not on file    Non-medical: Not on file  Tobacco Use  . Smoking status: Never Smoker  . Smokeless tobacco: Never Used  Substance and Sexual Activity  . Alcohol use: Yes    Alcohol/week: 0.0 standard drinks    Comment: occasionally  . Drug use: No  . Sexual activity: Not on file  Lifestyle  . Physical activity:    Days per week: Not on file    Minutes per session: Not on file  . Stress: Not on file  Relationships  . Social connections:    Talks on phone: Not on file    Gets together: Not on file    Attends religious service: Not on file    Active member of club or organization: Not on file    Attends meetings of clubs or organizations: Not on file    Relationship status: Not on file  . Intimate partner violence:    Fear of current or ex partner: Not on file    Emotionally abused: Not on file    Physically abused: Not on file    Forced sexual activity: Not on file  Other Topics Concern  . Not on file  Social History Narrative  . Not on file   Health Maintenance  Topic Date Due  . Hepatitis C Screening  03/20/60  . PNEUMOCOCCAL POLYSACCHARIDE VACCINE AGE 16-64 HIGH RISK  03/14/1962  . HIV Screening  03/15/1975  . FOOT EXAM  06/18/2018  . HEMOGLOBIN A1C  07/01/2018   . COLONOSCOPY  11/01/2018  . OPHTHALMOLOGY EXAM  11/07/2018  . TETANUS/TDAP  03/21/2025  . INFLUENZA VACCINE  Completed    The following portions of the patient's history were reviewed and updated as appropriate: allergies, current medications, past family history, past medical history, past social history, past surgical history and problem list.  Review of Systems Pertinent items noted in HPI and remainder of comprehensive ROS otherwise negative.   Objective:    BP 140/80 (BP Location: Left Arm, Patient Position: Sitting, Cuff Size: Large)   Pulse 97   Temp 97.9 F (36.6 C)   Wt 237 lb (107.5 kg)   SpO2 (!) 62%   BMI 36.57 kg/m  General appearance: alert, cooperative and no distress Head: Normocephalic, without obvious abnormality, atraumatic Eyes: conjunctivae/corneas clear. PERRL, EOM's intact. Fundi benign. Ears: normal TM's and external ear canals both ears Nose: Nares normal. Septum midline. Mucosa normal. No drainage or sinus tenderness. Throat: lips, mucosa, and tongue normal; teeth and gums normal Neck: no adenopathy, no carotid bruit, no JVD, supple, symmetrical, trachea midline and thyroid not enlarged, symmetric, no tenderness/mass/nodules Lungs: clear to auscultation bilaterally Heart: regular rate and rhythm, S1,  S2 normal, no murmur, click, rub or gallop Abdomen: soft, non-tender; bowel sounds normal; no masses,  no organomegaly Extremities: extremities normal, atraumatic, no cyanosis or edema  GU: deferred, followed by Urology Skin: Skin color, texture, turgor normal. No rashes or lesions, gynecomastia Neurologic: Alert and oriented X 3, normal strength and tone. Normal symmetric reflexes. Normal coordination and gait    Diabetic Foot Exam - Simple   Simple Foot Form Diabetic Foot exam was performed with the following findings:  Yes 06/21/2018  8:39 AM  Visual Inspection No deformities, no ulcerations, no other skin breakdown bilaterally:  Yes Sensation  Testing Intact to touch and monofilament testing bilaterally:  Yes Pulse Check Posterior Tibialis and Dorsalis pulse intact bilaterally:  Yes Comments Dry skin on b/l heels.      Assessment:    Healthy male exam with gynecomastia.     Plan:     Anticipatory guidance given including wearing seatbelts, smoke detectors in the home, increasing physical activity, increasing p.o. intake of water and vegetables. -will obtain labs -will hold off on PSA as pt followed by Urology with recent visit -influenza vaccine up to date -given handout -next CPE in 1 yr. See After Visit Summary for Counseling Recommendations    HTN: -Elevated this morning, however patient just took medication -if elevated at next OFV will discuss further medication options. -Continue Norvasc 10 mg, benazepril 40 mg, Toprol-XL 100 mg -Discussed lifestyle modifications  Gynecomastia -Continue following research -We will order TSH  Type 2 diabetes -We will obtain hemoglobin A1c -Diabetic foot exam done this visit -Vision up-to-date -Discussed lifestyle modifications -Continue metformin XR 1000 mg, Jardiance 25 mg  HLD -Continue Lipitor 40 mg daily -We will obtain lipid panel next visit -Lifestyle modifications encouraged  Follow-up PRN in the next 3 months  Grier Mitts, MD

## 2018-06-22 ENCOUNTER — Encounter: Payer: Self-pay | Admitting: Family Medicine

## 2018-07-16 DIAGNOSIS — N62 Hypertrophy of breast: Secondary | ICD-10-CM | POA: Diagnosis not present

## 2018-07-20 ENCOUNTER — Other Ambulatory Visit: Payer: Self-pay | Admitting: Plastic Surgery

## 2018-07-20 DIAGNOSIS — N6031 Fibrosclerosis of right breast: Secondary | ICD-10-CM | POA: Diagnosis not present

## 2018-07-20 DIAGNOSIS — N6032 Fibrosclerosis of left breast: Secondary | ICD-10-CM | POA: Diagnosis not present

## 2018-07-20 DIAGNOSIS — E291 Testicular hypofunction: Secondary | ICD-10-CM | POA: Diagnosis not present

## 2018-07-20 DIAGNOSIS — N62 Hypertrophy of breast: Secondary | ICD-10-CM | POA: Diagnosis not present

## 2018-10-06 ENCOUNTER — Encounter: Payer: Self-pay | Admitting: Family Medicine

## 2018-10-08 ENCOUNTER — Other Ambulatory Visit: Payer: Self-pay | Admitting: Family Medicine

## 2018-10-08 DIAGNOSIS — Z1211 Encounter for screening for malignant neoplasm of colon: Secondary | ICD-10-CM

## 2018-11-10 DIAGNOSIS — H401131 Primary open-angle glaucoma, bilateral, mild stage: Secondary | ICD-10-CM | POA: Diagnosis not present

## 2018-11-10 LAB — HM DIABETES EYE EXAM

## 2018-11-12 ENCOUNTER — Encounter: Payer: Self-pay | Admitting: Gastroenterology

## 2018-11-25 ENCOUNTER — Other Ambulatory Visit: Payer: Self-pay

## 2018-11-30 ENCOUNTER — Ambulatory Visit (AMBULATORY_SURGERY_CENTER): Payer: Self-pay

## 2018-11-30 ENCOUNTER — Other Ambulatory Visit: Payer: Self-pay

## 2018-11-30 VITALS — Ht 67.0 in | Wt 239.0 lb

## 2018-11-30 DIAGNOSIS — Z86010 Personal history of colonic polyps: Secondary | ICD-10-CM

## 2018-11-30 MED ORDER — PEG-KCL-NACL-NASULF-NA ASC-C 140 G PO SOLR: 1.0000 | Freq: Once | ORAL | 0 refills | Status: AC

## 2018-11-30 NOTE — Progress Notes (Signed)
Denies allergies to eggs or soy products. Denies complication of anesthesia or sedation. Denies use of weight loss medication. Denies use of O2.   Emmi instructions given for colonoscopy.  Pre-Visit was conducted by phone due to Covid 19. Instructions were reviewed with patient and mailed to confirmed home address. Patient was given a coupon for Plenvu. Patient was encouraged to call if he has any questions or concerns regarding instructions.

## 2018-12-13 ENCOUNTER — Encounter: Payer: BLUE CROSS/BLUE SHIELD | Admitting: Gastroenterology

## 2018-12-15 ENCOUNTER — Encounter: Payer: Self-pay | Admitting: Gastroenterology

## 2018-12-22 ENCOUNTER — Telehealth: Payer: Self-pay | Admitting: Gastroenterology

## 2018-12-22 NOTE — Telephone Encounter (Signed)

## 2018-12-23 ENCOUNTER — Encounter: Payer: BLUE CROSS/BLUE SHIELD | Admitting: Gastroenterology

## 2018-12-23 ENCOUNTER — Encounter: Payer: Self-pay | Admitting: Gastroenterology

## 2018-12-23 ENCOUNTER — Other Ambulatory Visit: Payer: Self-pay

## 2018-12-23 ENCOUNTER — Ambulatory Visit (AMBULATORY_SURGERY_CENTER): Payer: BC Managed Care – PPO | Admitting: Gastroenterology

## 2018-12-23 VITALS — BP 120/74 | HR 61 | Temp 98.7°F | Resp 19 | Ht 67.0 in | Wt 239.0 lb

## 2018-12-23 DIAGNOSIS — D123 Benign neoplasm of transverse colon: Secondary | ICD-10-CM

## 2018-12-23 DIAGNOSIS — Z86010 Personal history of colonic polyps: Secondary | ICD-10-CM | POA: Diagnosis not present

## 2018-12-23 DIAGNOSIS — K635 Polyp of colon: Secondary | ICD-10-CM

## 2018-12-23 DIAGNOSIS — Z1211 Encounter for screening for malignant neoplasm of colon: Secondary | ICD-10-CM | POA: Diagnosis not present

## 2018-12-23 MED ORDER — SODIUM CHLORIDE 0.9 % IV SOLN: 500.0000 mL | Freq: Once | INTRAVENOUS | Status: DC

## 2018-12-23 NOTE — Patient Instructions (Signed)
Thank you for allowing [place] to care for you today!  Await pathology results by mail, approximately 7-10 days.  Resume previous diet and medications today.  Return to your normal activities tomorrow.  Next recommended colonoscopy date will be determined after pathology results returned.         YOU HAD AN ENDOSCOPIC PROCEDURE TODAY AT [facility]:   Refer to the procedure report that was given to you for any specific questions about what was found during the examination.  If the procedure report does not answer your questions, please call your gastroenterologist to clarify.  If you requested that your care partner not be given the details of your procedure findings, then the procedure report has been included in a sealed envelope for you to review at your convenience later.  YOU SHOULD EXPECT: Some feelings of bloating in the abdomen. Passage of more gas than usual.  Walking can help get rid of the air that was put into your GI tract during the procedure and reduce the bloating. If you had a lower endoscopy (such as a colonoscopy or flexible sigmoidoscopy) you may notice spotting of blood in your stool or on the toilet paper. If you underwent a bowel prep for your procedure, you may not have a normal bowel movement for a few days.  Please Note:  You might notice some irritation and congestion in your nose or some drainage.  This is from the oxygen used during your procedure.  There is no need for concern and it should clear up in a day or so.  SYMPTOMS TO REPORT IMMEDIATELY:   Following lower endoscopy (colonoscopy or flexible sigmoidoscopy):  Excessive amounts of blood in the stool  Significant tenderness or worsening of abdominal pains  Swelling of the abdomen that is new, acute  Fever of 100F or higher  For urgent or emergent issues, a gastroenterologist can be reached at any hour by calling [phone].   DIET:  We do recommend a small meal at first, but  then you may proceed to your regular diet.  Drink plenty of fluids but you should avoid alcoholic beverages for 24 hours.  ACTIVITY:  You should plan to take it easy for the rest of today and you should NOT DRIVE or use heavy machinery until tomorrow (because of the sedation medicines used during the test).    FOLLOW UP: Our staff will call the number listed on your records 48-72 hours following your procedure to check on you and address any questions or concerns that you may have regarding the information given to you following your procedure. If we do not reach you, we will leave a message.  We will attempt to reach you two times.  During this call, we will ask if you have developed any symptoms of COVID 19. If you develop any symptoms (ie: fever, flu-like symptoms, shortness of breath, cough etc.) before then, please call [phone].  If you test positive for Covid 19 in the 2 weeks post procedure, please call and report this information to [place].    If any biopsies were taken you will be contacted by phone or by letter within the next 1-3 weeks.  Please call us at [phone] if you have not heard about the biopsies in 3 weeks.    SIGNATURES/CONFIDENTIALITY: You and/or your care partner have signed paperwork which will be entered into your electronic medical record.  These signatures attest to the fact that that the information above on  your After Visit Summary has been reviewed and is understood.  Full responsibility of the confidentiality of this discharge information lies with you and/or your care-partner. 

## 2018-12-23 NOTE — Progress Notes (Signed)
Courtney Washington took temp and Judy Branson took vitals. 

## 2018-12-23 NOTE — Progress Notes (Signed)
Report given to PACU, vss 

## 2018-12-23 NOTE — Op Note (Signed)
[facility] Patient Name: Robert Mcguire Procedure Date: 12/23/2018 9:56 AM MRN: [id number] Endoscopist: [place]. Loletha Carrow , MD Age: 58 Referring MD:  Date of Birth: 1961/07/05 Gender: Male Account #: [id number] Procedure:                Colonoscopy Indications:              Surveillance: Personal history of adenomatous                            polyps on last colonoscopy 3 years ago (TA x 4 and                            65mm TVA x 1 in 10/2015) Medicines:                Monitored Anesthesia Care Procedure:                Pre-Anesthesia Assessment:                           - Prior to the procedure, a History and Physical                            was performed, and patient medications and                            allergies were reviewed. The patient's tolerance of                            previous anesthesia was also reviewed. The risks                            and benefits of the procedure and the sedation                            options and risks were discussed with the patient.                            All questions were answered, and informed consent                            was obtained. Prior Anticoagulants: The patient has                            taken no previous anticoagulant or antiplatelet                            agents. ASA Grade Assessment: II - A patient with                            mild systemic disease. After reviewing the risks                            and benefits, the patient was deemed in  satisfactory condition to undergo the procedure.                           After obtaining informed consent, the colonoscope                            was passed under direct vision. Throughout the                            procedure, the patient's blood pressure, pulse, and                            oxygen saturations were monitored continuously. The                            Colonoscope was introduced through the anus  and                            advanced to the the cecum, identified by                            appendiceal orifice and ileocecal valve. The                            colonoscopy was performed without difficulty. The                            patient tolerated the procedure well. The quality                            of the bowel preparation was excellent. The                            ileocecal valve, appendiceal orifice, and rectum                            were photographed. Scope In: 10:21:17 AM Scope Out: 10:35:20 AM Scope Withdrawal Time: 0 hours 10 minutes 43 seconds  Total Procedure Duration: 0 hours 14 minutes 3 seconds  Findings:                 The perianal and digital rectal examinations were                            normal.                           Two sessile polyps were found in the hepatic                            flexure. The polyps were diminutive in size. These                            polyps were removed with a cold biopsy forceps.  Resection and retrieval were complete.                           The exam was otherwise without abnormality on                            direct and retroflexion views. Complications:            No immediate complications. Estimated Blood Loss:     Estimated blood loss was minimal. Impression:               - Two diminutive polyps at the hepatic flexure,                            removed with a cold biopsy forceps. Resected and                            retrieved.                           - The examination was otherwise normal on direct                            and retroflexion views. Recommendation:           - Patient has a contact number available for                            emergencies. The signs and symptoms of potential                            delayed complications were discussed with the                            patient. Return to normal activities tomorrow.                             Written discharge instructions were provided to the                            patient.                           - Resume previous diet.                           - Continue present medications.                           - Await pathology results.                           - Repeat colonoscopy is recommended for                            surveillance. The colonoscopy date will be  determined after pathology results from today's                            exam become available for review. Farhan Jean L. Loletha Carrow, MD 12/23/2018 10:43:28 AM This report has been signed electronically.

## 2018-12-23 NOTE — Progress Notes (Signed)
Pt's states no medical or surgical changes since previsit or office visit. 

## 2018-12-23 NOTE — Progress Notes (Signed)
Called to room to assist during endoscopic procedure.  Patient ID and intended procedure confirmed with present staff. Received instructions for my participation in the procedure from the performing physician.  

## 2018-12-27 ENCOUNTER — Encounter: Payer: Self-pay | Admitting: Gastroenterology

## 2018-12-27 ENCOUNTER — Telehealth: Payer: Self-pay

## 2018-12-27 NOTE — Telephone Encounter (Signed)
Left message on follow up call. 

## 2019-01-03 DIAGNOSIS — N62 Hypertrophy of breast: Secondary | ICD-10-CM | POA: Diagnosis not present

## 2019-02-03 ENCOUNTER — Telehealth: Payer: Self-pay | Admitting: Family Medicine

## 2019-02-03 DIAGNOSIS — E119 Type 2 diabetes mellitus without complications: Secondary | ICD-10-CM

## 2019-02-03 DIAGNOSIS — I10 Essential (primary) hypertension: Secondary | ICD-10-CM

## 2019-02-03 NOTE — Telephone Encounter (Signed)
Medication Refill - Medication: metformin, amlodopine, benazepril,  Has the patient contacted their pharmacy? No. (Agent: If no, request that the patient contact the pharmacy for the refill.) (Agent: If yes, when and what did the pharmacy advise?)  Preferred Pharmacy (with phone number or street name):  [id number] [place] [address]  [address] [place] [address]  Phone: [phone] Fax: [phone]  Not a 24 hour pharmacy; exact hours not known.    Pt wanting to find out his blood type. Please advise.  Agent: Please be advised that RX refills may take up to 3 business days. We ask that you follow-up with your pharmacy.

## 2019-02-09 MED ORDER — AMLODIPINE BESYLATE 10 MG PO TABS: 10.0000 mg | ORAL_TABLET | Freq: Every day | ORAL | 0 refills | Status: DC

## 2019-02-09 MED ORDER — BENAZEPRIL HCL 40 MG PO TABS: 40.0000 mg | ORAL_TABLET | Freq: Every day | ORAL | 0 refills | Status: DC

## 2019-02-09 MED ORDER — METFORMIN HCL ER 500 MG PO TB24: ORAL_TABLET | ORAL | 0 refills | Status: DC

## 2019-02-09 NOTE — Telephone Encounter (Signed)
Rx(s) refilled and sent to pharmacy

## 2019-02-17 DIAGNOSIS — N401 Enlarged prostate with lower urinary tract symptoms: Secondary | ICD-10-CM | POA: Diagnosis not present

## 2019-02-17 DIAGNOSIS — R3912 Poor urinary stream: Secondary | ICD-10-CM | POA: Diagnosis not present

## 2019-02-17 DIAGNOSIS — E291 Testicular hypofunction: Secondary | ICD-10-CM | POA: Diagnosis not present

## 2019-02-23 DIAGNOSIS — E291 Testicular hypofunction: Secondary | ICD-10-CM | POA: Diagnosis not present

## 2019-02-23 DIAGNOSIS — R3912 Poor urinary stream: Secondary | ICD-10-CM | POA: Diagnosis not present

## 2019-02-23 DIAGNOSIS — N5201 Erectile dysfunction due to arterial insufficiency: Secondary | ICD-10-CM | POA: Diagnosis not present

## 2019-02-23 DIAGNOSIS — N401 Enlarged prostate with lower urinary tract symptoms: Secondary | ICD-10-CM | POA: Diagnosis not present

## 2019-02-28 ENCOUNTER — Encounter: Payer: Self-pay | Admitting: Family Medicine

## 2019-03-01 ENCOUNTER — Other Ambulatory Visit: Payer: Self-pay

## 2019-03-01 MED ORDER — METOPROLOL SUCCINATE ER 100 MG PO TB24: ORAL_TABLET | ORAL | 4 refills | Status: DC

## 2019-03-08 ENCOUNTER — Other Ambulatory Visit: Payer: Self-pay

## 2019-03-08 ENCOUNTER — Telehealth (INDEPENDENT_AMBULATORY_CARE_PROVIDER_SITE_OTHER): Payer: BC Managed Care – PPO | Admitting: Family Medicine

## 2019-03-08 DIAGNOSIS — R6883 Chills (without fever): Secondary | ICD-10-CM

## 2019-03-08 DIAGNOSIS — R509 Fever, unspecified: Secondary | ICD-10-CM | POA: Diagnosis not present

## 2019-03-08 DIAGNOSIS — R51 Headache: Secondary | ICD-10-CM

## 2019-03-08 DIAGNOSIS — Z7189 Other specified counseling: Secondary | ICD-10-CM

## 2019-03-08 DIAGNOSIS — R519 Headache, unspecified: Secondary | ICD-10-CM

## 2019-03-08 DIAGNOSIS — R52 Pain, unspecified: Secondary | ICD-10-CM

## 2019-03-08 DIAGNOSIS — R6889 Other general symptoms and signs: Secondary | ICD-10-CM | POA: Diagnosis not present

## 2019-03-08 DIAGNOSIS — Z20822 Contact with and (suspected) exposure to covid-19: Secondary | ICD-10-CM

## 2019-03-08 NOTE — Progress Notes (Signed)
Virtual Visit via Video Note  I connected with Robert Mcguire on 03/08/19 at  2:00 PM EDT by a video enabled telemedicine application 2/2 [id number] pandemic and verified that I am speaking with the correct person using two identifiers.  Location patient: home Location provider:work or home office Persons participating in the virtual visit: patient, provider  I discussed the limitations of evaluation and management by telemedicine and the availability of in person appointments. The patient expressed understanding and agreed to proceed.   HPI: Pt is a 58 yo male with pmh sig for HTN, DM II, GERD, ED, gynecomastia, HLD who is seen for increasing temp 98.25F, 98.50F, 99.71F took tylenol over the last 2 days.  Now temp 99.5 F.  Also having chills, scratchy throat-has to clear throat, feeling achy, HAs, decreased appetite.  Pt denies cough, n/v, diarrhea, ear pain or pressure.  Denies sick contacts.   ROS: See pertinent positives and negatives per HPI.  Past Medical History:  Diagnosis Date  . Allergic rhinitis   . Allergy   . Diabetes mellitus without complication ([place])   . ED (erectile dysfunction)   . GERD (gastroesophageal reflux disease)   . Glaucoma   . Hyperlipidemia   . Hypertension   . Impaired glucose tolerance   . Nephrolithiasis   . Obesity     Past Surgical History:  Procedure Laterality Date  . COLONOSCOPY    . difficultity with intabation    . GYNECOMASTIA EXCISION Bilateral   . POLYPECTOMY    . right achilles tendon surgery      Family History  Problem Relation Age of Onset  . Hypertension Mother   . Obesity Father   . Diabetes Father   . Hypertension Father   . Colon cancer Father 42  . Colon polyps Father   . Diabetes Other   . Hypertension Other   . Breast cancer Other        breast, colon   . Stomach cancer Other        paternal great grand father  . Healthy Sister   . Hypertension Brother   . Colon polyps Brother   . Hypertension Maternal Grandmother    . Hypertension Brother   . Esophageal cancer Neg Hx   . Rectal cancer Neg Hx      Current Outpatient Medications:  .  amLODipine (NORVASC) 10 MG tablet, Take 1 tablet (10 mg total) by mouth daily., Disp: 90 tablet, Rfl: 0 .  atorvastatin (LIPITOR) 40 MG tablet, Take 1 tablet (40 mg total) by mouth daily., Disp: 90 tablet, Rfl: 4 .  benazepril (LOTENSIN) 40 MG tablet, Take 1 tablet (40 mg total) by mouth daily., Disp: 90 tablet, Rfl: 0 .  empagliflozin (JARDIANCE) 25 MG TABS tablet, Take 25 mg by mouth daily., Disp: 90 tablet, Rfl: 4 .  lansoprazole (PREVACID) 30 MG capsule, Take 1 capsule (30 mg total) by mouth daily., Disp: 90 capsule, Rfl: 1 .  latanoprost (XALATAN) 0.005 % ophthalmic solution, , Disp: , Rfl:  .  metFORMIN (GLUCOPHAGE-XR) 500 MG 24 hr tablet, TAKE 2 TABLETS BY MOUTH EVERY DAY WITH BREAKFAST, Disp: 180 tablet, Rfl: 0 .  metoprolol succinate (TOPROL-XL) 100 MG 24 hr tablet, TAKE 1 TABLET DAILY WITH ORIMMEDIATELY FOLLOWING A    MEAL, Disp: 90 tablet, Rfl: 4 .  sildenafil (VIAGRA) 100 MG tablet, Take 1 tablet (100 mg total) by mouth daily as needed for erectile dysfunction., Disp: 30 tablet, Rfl: 6  EXAM:  VITALS per patient if applicable:  RR between 12-20 bpm.  Temp 99.55F  GENERAL: alert, oriented, appears well and in no acute distress  HEENT: atraumatic, conjunctiva clear, no obvious abnormalities on inspection of external nose and ears  NECK: normal movements of the head and neck  LUNGS: on inspection no signs of respiratory distress, breathing rate appears normal, no obvious gross SOB, gasping or wheezing  CV: no obvious cyanosis  MS: moves all visible extremities without noticeable abnormality  PSYCH/NEURO: pleasant and cooperative, no obvious depression or anxiety, speech and thought processing grossly intact  ASSESSMENT AND PLAN:  Discussed the following assessment and plan:  Discussed overall symptoms likely viral in nature.  Possibly 2/2 a cold,  influenza, but cannot r/o COVID infection.  Pt to have COVID testing this afternoon.  Discussed self quarantine while awaiting results.  Given precautions.  Offered note for work, pt declines states "I'm the boss".  Chills -supportive care  Body aches -supportive care.  Ok to use tylenol.  Limit amount to 3000 mg per day.  Nonintractable headache, unspecified chronicity pattern, unspecified headache type -ok to take tylenol prn  Elevated temperature -continue to monitor -ok to take Tylenol prn  Educated About Covid-19 Virus Infection -given current symptoms COVID testing recommended. -given info about testing in [employer].  F/u prn    I discussed the assessment and treatment plan with the patient. The patient was provided an opportunity to ask questions and all were answered. The patient agreed with the plan and demonstrated an understanding of the instructions.   The patient was advised to call back or seek an in-person evaluation if the symptoms worsen or if the condition fails to improve as anticipated.  [name], MD

## 2019-03-10 ENCOUNTER — Encounter: Payer: Self-pay | Admitting: Family Medicine

## 2019-03-10 ENCOUNTER — Ambulatory Visit: Payer: Self-pay

## 2019-03-10 ENCOUNTER — Other Ambulatory Visit: Payer: Self-pay

## 2019-03-10 ENCOUNTER — Encounter: Payer: Self-pay | Admitting: Emergency Medicine

## 2019-03-10 ENCOUNTER — Emergency Department: Payer: BC Managed Care – PPO

## 2019-03-10 ENCOUNTER — Emergency Department
Admission: EM | Admit: 2019-03-10 | Discharge: 2019-03-10 | Disposition: A | Discharge status: Home / Self Care | Payer: BC Managed Care – PPO | Attending: Student in an Organized Health Care Education/Training Program | Admitting: Student in an Organized Health Care Education/Training Program

## 2019-03-10 ENCOUNTER — Telehealth: Payer: Self-pay

## 2019-03-10 DIAGNOSIS — Z79899 Other long term (current) drug therapy: Secondary | ICD-10-CM | POA: Diagnosis not present

## 2019-03-10 DIAGNOSIS — Z20828 Contact with and (suspected) exposure to other viral communicable diseases: Secondary | ICD-10-CM | POA: Diagnosis not present

## 2019-03-10 DIAGNOSIS — E119 Type 2 diabetes mellitus without complications: Secondary | ICD-10-CM | POA: Diagnosis not present

## 2019-03-10 DIAGNOSIS — I10 Essential (primary) hypertension: Secondary | ICD-10-CM | POA: Insufficient documentation

## 2019-03-10 DIAGNOSIS — J189 Pneumonia, unspecified organism: Secondary | ICD-10-CM | POA: Diagnosis not present

## 2019-03-10 DIAGNOSIS — R509 Fever, unspecified: Secondary | ICD-10-CM | POA: Diagnosis not present

## 2019-03-10 DIAGNOSIS — R918 Other nonspecific abnormal finding of lung field: Secondary | ICD-10-CM | POA: Diagnosis not present

## 2019-03-10 LAB — CBC WITH DIFFERENTIAL/PLATELET
Abs Immature Granulocytes: 0.03 10*3/uL (ref 0.00–0.07)
Basophils Absolute: 0 10*3/uL (ref 0.0–0.1)
Basophils Relative: 0 %
Eosinophils Absolute: 0 10*3/uL (ref 0.0–0.5)
Eosinophils Relative: 0 %
HCT: 43.8 % (ref 39.0–52.0)
Hemoglobin: 15.5 g/dL (ref 13.0–17.0)
Immature Granulocytes: 0 %
Lymphocytes Relative: 7 %
Lymphs Abs: 0.7 10*3/uL (ref 0.7–4.0)
MCH: 30 pg (ref 26.0–34.0)
MCHC: 35.4 g/dL (ref 30.0–36.0)
MCV: 84.9 fL (ref 80.0–100.0)
Monocytes Absolute: 1.1 10*3/uL — ABNORMAL HIGH (ref 0.1–1.0)
Monocytes Relative: 10 %
Neutro Abs: 9.3 10*3/uL — ABNORMAL HIGH (ref 1.7–7.7)
Neutrophils Relative %: 83 %
Platelets: 185 10*3/uL (ref 150–400)
RBC: 5.16 MIL/uL (ref 4.22–5.81)
RDW: 13 % (ref 11.5–15.5)
WBC: 11.1 10*3/uL — ABNORMAL HIGH (ref 4.0–10.5)
nRBC: 0 % (ref 0.0–0.2)

## 2019-03-10 LAB — COMPREHENSIVE METABOLIC PANEL
ALT: 25 U/L (ref 0–44)
AST: 77 U/L — ABNORMAL HIGH (ref 15–41)
Albumin: 4.1 g/dL (ref 3.5–5.0)
Alkaline Phosphatase: 51 U/L (ref 38–126)
Anion gap: 13 (ref 5–15)
BUN: 14 mg/dL (ref 6–20)
CO2: 20 mmol/L — ABNORMAL LOW (ref 22–32)
Calcium: 9 mg/dL (ref 8.9–10.3)
Chloride: 99 mmol/L (ref 98–111)
Creatinine, Ser: 0.85 mg/dL (ref 0.61–1.24)
GFR calc Af Amer: >60 mL/min (ref >60)
GFR calc non Af Amer: >60 mL/min (ref >60)
Glucose, Bld: 147 mg/dL — ABNORMAL HIGH (ref 70–99)
Potassium: 3.4 mmol/L — ABNORMAL LOW (ref 3.5–5.1)
Sodium: 132 mmol/L — ABNORMAL LOW (ref 135–145)
Total Bilirubin: 2.6 mg/dL — ABNORMAL HIGH (ref 0.3–1.2)
Total Protein: 7.6 g/dL (ref 6.5–8.1)

## 2019-03-10 LAB — URINALYSIS, COMPLETE (UACMP) WITH MICROSCOPIC
Bacteria, UA: NONE SEEN
Bilirubin Urine: NEGATIVE
Glucose, UA: >=500 mg/dL — AB
Ketones, ur: 20 mg/dL — AB
Leukocytes,Ua: NEGATIVE
Nitrite: NEGATIVE
Protein, ur: 30 mg/dL — AB
Specific Gravity, Urine: 1.016 (ref 1.005–1.030)
Squamous Epithelial / HPF: NONE SEEN (ref 0–5)
pH: 6 (ref 5.0–8.0)

## 2019-03-10 LAB — LACTIC ACID, PLASMA: Lactic Acid, Venous: 1.6 mmol/L (ref 0.5–1.9)

## 2019-03-10 LAB — SARS CORONAVIRUS 2 BY RT PCR (HOSPITAL ORDER, PERFORMED IN ~~LOC~~ HOSPITAL LAB): SARS Coronavirus 2: NEGATIVE

## 2019-03-10 LAB — NOVEL CORONAVIRUS, NAA: SARS-CoV-2, NAA: NOT DETECTED

## 2019-03-10 MED ORDER — SODIUM CHLORIDE 0.9 % IV SOLN
2.0000 g | INTRAVENOUS | Status: DC
  Filled 2019-03-10: qty 20

## 2019-03-10 MED ORDER — AZITHROMYCIN 500 MG PO TABS: 500.0000 mg | ORAL_TABLET | Freq: Every day | ORAL | 0 refills | Status: AC

## 2019-03-10 MED ORDER — AZITHROMYCIN 500 MG PO TABS
500.0000 mg | ORAL_TABLET | Freq: Once | ORAL | Status: AC
  Administered 2019-03-10: 500 mg via ORAL
  Filled 2019-03-10: qty 1

## 2019-03-10 MED ORDER — SODIUM CHLORIDE 0.9 % IV SOLN
500.0000 mg | INTRAVENOUS | Status: DC
  Filled 2019-03-10: qty 500

## 2019-03-10 MED ORDER — SODIUM CHLORIDE 0.9% FLUSH
3.0000 mL | Freq: Once | INTRAVENOUS | Status: AC
  Administered 2019-03-10: 17:00:00 3 mL via INTRAVENOUS

## 2019-03-10 MED ORDER — ONDANSETRON HCL 4 MG PO TABS: 4.0000 mg | ORAL_TABLET | Freq: Every day | ORAL | 0 refills | Status: AC | PRN

## 2019-03-10 MED ORDER — ALBUTEROL SULFATE HFA 108 (90 BASE) MCG/ACT IN AERS: 2.0000 | INHALATION_SPRAY | Freq: Four times a day (QID) | RESPIRATORY_TRACT | 0 refills | Status: DC | PRN

## 2019-03-10 MED ORDER — AMOXICILLIN-POT CLAVULANATE 875-125 MG PO TABS
1.0000 | ORAL_TABLET | Freq: Once | ORAL | Status: AC
  Administered 2019-03-10: 1 via ORAL
  Filled 2019-03-10: qty 1

## 2019-03-10 MED ORDER — AMOXICILLIN-POT CLAVULANATE 875-125 MG PO TABS: 1.0000 | ORAL_TABLET | Freq: Two times a day (BID) | ORAL | 0 refills | Status: AC

## 2019-03-10 MED ORDER — SODIUM CHLORIDE 0.9 % IV BOLUS: 500.0000 mL | Freq: Once | INTRAVENOUS | Status: DC

## 2019-03-10 MED ORDER — IBUPROFEN 400 MG PO TABS
400.0000 mg | ORAL_TABLET | Freq: Once | ORAL | Status: AC
  Administered 2019-03-10: 14:00:00 400 mg via ORAL
  Filled 2019-03-10: qty 1

## 2019-03-10 NOTE — ED Notes (Signed)
Patient reports fever since Monday.  Patient has had a covid-19 test that came back negative.  Patient reports headache, body aches and chills but denies shortness of breath, cough, chest pain, nausea, vomiting and diarrhea.  Patient is alert and oriented x 4.  No obvious distress at this time.

## 2019-03-10 NOTE — ED Notes (Signed)
This RN attempted an IV without success.

## 2019-03-10 NOTE — ED Notes (Addendum)
This RN attempted IV access without success.  ?

## 2019-03-10 NOTE — ED Provider Notes (Signed)
[facility] Emergency Department Provider Note    First MD Initiated Contact with Patient 03/10/19 1719     (approximate)  I have reviewed the triage vital signs and the nursing notes.   HISTORY  Chief Complaint Fever     HPI Robert Mcguire is a 58 y.o. male presents to the ER for evaluation of fever generalized malaise for the past 4 days.  States he is having fevers that are somewhat controlled by Tylenol but then come back.  He feels very weak and fatigued.  Wife is noticed that he is having shallow breaths.  Had a COVID test which was -3 days ago.  Denies any nausea or vomiting.  No headache.  Denies any cough or chest pain.  No recent antibiotics.    Past Medical History:  Diagnosis Date   Allergic rhinitis    Allergy    Diabetes mellitus without complication (HCC)    ED (erectile dysfunction)    GERD (gastroesophageal reflux disease)    Glaucoma    Hyperlipidemia    Hypertension    Impaired glucose tolerance    Nephrolithiasis    Obesity    Family History  Problem Relation Age of Onset   Hypertension Mother    Obesity Father    Diabetes Father    Hypertension Father    Colon cancer Father 55   Colon polyps Father    Diabetes Other    Hypertension Other    Breast cancer Other        breast, colon    Stomach cancer Other        paternal great grand father   Healthy Sister    Hypertension Brother    Colon polyps Brother    Hypertension Maternal Grandmother    Hypertension Brother    Esophageal cancer Neg Hx    Rectal cancer Neg Hx    Past Surgical History:  Procedure Laterality Date   COLONOSCOPY     difficultity with intabation     GYNECOMASTIA EXCISION Bilateral    POLYPECTOMY     right achilles tendon surgery     Patient Active Problem List   Diagnosis Date Noted   Type 2 diabetes mellitus without complication, without long-term current use of insulin ([place]) 08/20/2016   Hx of colonic  polyps 09/30/2011   GYNECOMASTIA 01/15/2010   NEPHROLITHIASIS, HX OF 05/04/2008   SHOULDER PAIN, LEFT 11/08/2007   Obesity 01/22/2007   ERECTILE DYSFUNCTION 01/22/2007   Essential hypertension 01/22/2007   ALLERGIC RHINITIS, SEASONAL 01/22/2007   GERD 01/22/2007   HYPOKALEMIA, HX OF 01/22/2007      Prior to Admission medications   Medication Sig Start Date End Date Taking? Authorizing Provider  albuterol (VENTOLIN HFA) 108 (90 Base) MCG/ACT inhaler Inhale 2 puffs into the lungs every 6 (six) hours as needed for wheezing or shortness of breath. 03/10/19   Merlyn Lot, MD  amLODipine (NORVASC) 10 MG tablet Take 1 tablet (10 mg total) by mouth daily. 02/09/19   [name], MD  amoxicillin-clavulanate (AUGMENTIN) 875-125 MG tablet Take 1 tablet by mouth 2 (two) times daily for 7 days. 03/10/19 03/17/19  Merlyn Lot, MD  atorvastatin (LIPITOR) 40 MG tablet Take 1 tablet (40 mg total) by mouth daily. 12/30/17   Marletta Lor, MD  azithromycin (ZITHROMAX) 500 MG tablet Take 1 tablet (500 mg total) by mouth daily for 3 days. Take 1 tablet daily for 3 days. 03/10/19 03/13/19  Merlyn Lot, MD  benazepril (LOTENSIN)  40 MG tablet Take 1 tablet (40 mg total) by mouth daily. 02/09/19   [name], MD  empagliflozin (JARDIANCE) 25 MG TABS tablet Take 25 mg by mouth daily. 12/30/17   Marletta Lor, MD  lansoprazole (PREVACID) 30 MG capsule Take 1 capsule (30 mg total) by mouth daily. 12/30/17   Marletta Lor, MD  latanoprost (XALATAN) 0.005 % ophthalmic solution  01/03/16   [provider]  metFORMIN (GLUCOPHAGE-XR) 500 MG 24 hr tablet TAKE 2 TABLETS BY MOUTH EVERY DAY WITH BREAKFAST 02/09/19   [name], MD  metoprolol succinate (TOPROL-XL) 100 MG 24 hr tablet TAKE 1 TABLET DAILY WITH ORIMMEDIATELY FOLLOWING A    MEAL 03/01/19   [name], MD  ondansetron (ZOFRAN) 4 MG tablet Take 1 tablet (4 mg total) by mouth daily as needed. 03/10/19 03/09/20   Merlyn Lot, MD  sildenafil (VIAGRA) 100 MG tablet Take 1 tablet (100 mg total) by mouth daily as needed for erectile dysfunction. 12/30/17   Marletta Lor, MD    Allergies Patient has no known allergies.    Social History Social History   Tobacco Use   Smoking status: Never Smoker   Smokeless tobacco: Never Used  Substance Use Topics   Alcohol use: Yes    Alcohol/week: 0.0 standard drinks    Comment: occasionally   Drug use: No    Review of Systems Patient denies headaches, rhinorrhea, blurry vision, numbness, shortness of breath, chest pain, edema, cough, abdominal pain, nausea, vomiting, diarrhea, dysuria, fevers, rashes or hallucinations unless otherwise stated above in HPI. ____________________________________________   PHYSICAL EXAM:  VITAL SIGNS: Vitals:   03/10/19 1921 03/10/19 1934  BP:  (!) 165/85  Pulse:    Resp:    Temp: 98.8 F (37.1 C)   SpO2: 100%     Constitutional: Alert and oriented.  Eyes: Conjunctivae are normal.  Head: Atraumatic. Nose: No congestion/rhinnorhea. Mouth/Throat: Mucous membranes are moist.   Neck: No stridor. Painless ROM.  Cardiovascular: Normal rate, regular rhythm. Grossly normal heart sounds.  Good peripheral circulation. Respiratory: rhonchi in left lung field Gastrointestinal: Soft and nontender. No distention. No abdominal bruits. No CVA tenderness. Genitourinary:  Musculoskeletal: No lower extremity tenderness nor edema.  No joint effusions. Neurologic:  Normal speech and language. No gross focal neurologic deficits are appreciated. No facial droop Skin:  Skin is warm, dry and intact. No rash noted. Psychiatric: Mood and affect are normal. Speech and behavior are normal.  ____________________________________________   LABS (all labs ordered are listed, but only abnormal results are displayed)  Results for orders placed or performed during the hospital encounter of 03/10/19 (from the past 24 hour(s))   Lactic acid, plasma     Status: None   Collection Time: 03/10/19  1:37 PM  Result Value Ref Range   Lactic Acid, Venous 1.6 0.5 - 1.9 mmol/L  Comprehensive metabolic panel     Status: Abnormal   Collection Time: 03/10/19  1:37 PM  Result Value Ref Range   Sodium 132 (L) 135 - 145 mmol/L   Potassium 3.4 (L) 3.5 - 5.1 mmol/L   Chloride 99 98 - 111 mmol/L   CO2 20 (L) 22 - 32 mmol/L   Glucose, Bld 147 (H) 70 - 99 mg/dL   BUN 14 6 - 20 mg/dL   Creatinine, Ser 0.85 0.61 - 1.24 mg/dL   Calcium 9.0 8.9 - 10.3 mg/dL   Total Protein 7.6 6.5 - 8.1 g/dL   Albumin 4.1 3.5 -  5.0 g/dL   AST 77 (H) 15 - 41 U/L   ALT 25 0 - 44 U/L   Alkaline Phosphatase 51 38 - 126 U/L   Total Bilirubin 2.6 (H) 0.3 - 1.2 mg/dL   GFR calc non Af Amer >60 >60 mL/min   GFR calc Af Amer >60 >60 mL/min   Anion gap 13 5 - 15  CBC with Differential     Status: Abnormal   Collection Time: 03/10/19  1:37 PM  Result Value Ref Range   WBC 11.1 (H) 4.0 - 10.5 K/uL   RBC 5.16 4.22 - 5.81 MIL/uL   Hemoglobin 15.5 13.0 - 17.0 g/dL   HCT 43.8 39.0 - 52.0 %   MCV 84.9 80.0 - 100.0 fL   MCH 30.0 26.0 - 34.0 pg   MCHC 35.4 30.0 - 36.0 g/dL   RDW 13.0 11.5 - 15.5 %   Platelets 185 150 - 400 K/uL   nRBC 0.0 0.0 - 0.2 %   Neutrophils Relative % 83 %   Neutro Abs 9.3 (H) 1.7 - 7.7 K/uL   Lymphocytes Relative 7 %   Lymphs Abs 0.7 0.7 - 4.0 K/uL   Monocytes Relative 10 %   Monocytes Absolute 1.1 (H) 0.1 - 1.0 K/uL   Eosinophils Relative 0 %   Eosinophils Absolute 0.0 0.0 - 0.5 K/uL   Basophils Relative 0 %   Basophils Absolute 0.0 0.0 - 0.1 K/uL   Immature Granulocytes 0 %   Abs Immature Granulocytes 0.03 0.00 - 0.07 K/uL  Urinalysis, Complete w Microscopic     Status: Abnormal   Collection Time: 03/10/19  1:37 PM  Result Value Ref Range   Color, Urine YELLOW (A) YELLOW   APPearance CLEAR (A) CLEAR   Specific Gravity, Urine 1.016 1.005 - 1.030   pH 6.0 5.0 - 8.0   Glucose, UA >=500 (A) NEGATIVE mg/dL   Hgb urine  dipstick MODERATE (A) NEGATIVE   Bilirubin Urine NEGATIVE NEGATIVE   Ketones, ur 20 (A) NEGATIVE mg/dL   Protein, ur 30 (A) NEGATIVE mg/dL   Nitrite NEGATIVE NEGATIVE   Leukocytes,Ua NEGATIVE NEGATIVE   RBC / HPF 0-5 0 - 5 RBC/hpf   WBC, UA 0-5 0 - 5 WBC/hpf   Bacteria, UA NONE SEEN NONE SEEN   Squamous Epithelial / LPF NONE SEEN 0 - 5   ____________________________________________  EKG____________________________________________  RADIOLOGY  I personally reviewed all radiographic images ordered to evaluate for the above acute complaints and reviewed radiology reports and findings.  These findings were personally discussed with the patient.  Please see medical record for radiology report.  ____________________________________________   PROCEDURES  Procedure(s) performed:  Procedures    Critical Care performed: no ____________________________________________   INITIAL IMPRESSION / ASSESSMENT AND PLAN / ED COURSE  Pertinent labs & imaging results that were available during my care of the patient were reviewed by me and considered in my medical decision making (see chart for details).   DDX: Pneumonia, bronchitis, coated, sepsis, dehydration, electrolyte abnormality  LINDY YELINEK is a 58 y.o. who presents to the ED with symptoms as described above.  Patient febrile mildly tachycardic but normotensive without any hypoxia.  Blood work sent for the but differential does show evidence of infiltrate on chest x-ray with mild leukocytosis.  No uremia.  No significant increased work of breathing.  Clinical Course as of Mar 09 1936  Thu Mar 10, 2019  1932 Patient was able to ambulate without any hypoxia.  Symptoms are  consistent with pneumonia community-acquired.  Will repeat COVID though this seems more like a typical community-acquired pneumonia.  Low risk by [place] [address].   Patient tolerating oral antibiotics.  We discussed conservative management and signs symptoms which she should  return to the ER.   [PR]    Clinical Course User Index [PR] Merlyn Lot, MD    The patient was evaluated in Emergency Department today for the symptoms described in the history of present illness. He/she was evaluated in the context of the global COVID-19 pandemic, which necessitated consideration that the patient might be at risk for infection with the SARS-CoV-2 virus that causes COVID-19. Institutional protocols and algorithms that pertain to the evaluation of patients at risk for COVID-19 are in a state of rapid change based on information released by regulatory bodies including the CDC and federal and state organizations. These policies and algorithms were followed during the patient's care in the ED.  As part of my medical decision making, I reviewed the following data within the [facility] notes reviewed and incorporated, Labs reviewed, notes from prior ED visits and [place] Controlled Substance Database   ____________________________________________   FINAL CLINICAL IMPRESSION(S) / ED DIAGNOSES  Final diagnoses:  Community acquired pneumonia of left lower lobe of lung ([place])      NEW MEDICATIONS STARTED DURING THIS VISIT:  New Prescriptions   ALBUTEROL (VENTOLIN HFA) 108 (90 BASE) MCG/ACT INHALER    Inhale 2 puffs into the lungs every 6 (six) hours as needed for wheezing or shortness of breath.   AMOXICILLIN-CLAVULANATE (AUGMENTIN) 875-125 MG TABLET    Take 1 tablet by mouth 2 (two) times daily for 7 days.   AZITHROMYCIN (ZITHROMAX) 500 MG TABLET    Take 1 tablet (500 mg total) by mouth daily for 3 days. Take 1 tablet daily for 3 days.   ONDANSETRON (ZOFRAN) 4 MG TABLET    Take 1 tablet (4 mg total) by mouth daily as needed.     Note:  This document was prepared using Dragon voice recognition software and may include unintentional dictation errors.    Merlyn Lot, MD 03/10/19 [phone]

## 2019-03-10 NOTE — Telephone Encounter (Signed)
Copied from [place] [id number]. Topic: Appointment Scheduling - Scheduling Inquiry for Clinic >> Mar 10, 2019  8:54 AM Burchel, Abbi R wrote: Reason for CRM: Pt's wife states pt is still exp fever + symptoms from 9/1 appt.  COVID test was negative for both pt and his wife.  Please advise re: next steps.  Pt's wife [name]): [phone]

## 2019-03-10 NOTE — Telephone Encounter (Signed)
Will monitor for ER arrival.

## 2019-03-10 NOTE — ED Triage Notes (Addendum)
Fever.  Onset of symptoms Monday.  States has been taking tylenol every 6  Hours for fever.  Last taken at 1200.  Had COVID test on Tuesday, that was negative.  Denies other symptoms.  Only c/o headache when temperature is elevated.  Denies SOB.  Also c/o urinary frequency since Tuesday, worse at nighttime.

## 2019-03-10 NOTE — Telephone Encounter (Signed)
FYI Pt is at the ED

## 2019-03-10 NOTE — Telephone Encounter (Signed)
Patient has arrived to ED.

## 2019-03-10 NOTE — Telephone Encounter (Addendum)
Patient and his wife called stating that Mr Qu has had fever chills body aches. They were both tested for COVID-19 and tested negative on Monday. He now has fever of 103. His BP [id number]. He has no appitite and has been voiding frequently.  He has not checked his BS today. Per protocol pt will go to ER for evaluation of his symptoms. Care advise. Patient is talikg tylenol for fever. Care advice read to patient. Patient verbalized understanding.  Reason for Disposition . Patient sounds very sick or weak to the triager  (Exception: mild weakness and hasn't taken fever medicine)  Answer Assessment - Initial Assessment Questions 1. TEMPERATURE: "What is the most recent temperature?"  "How was it measured?"      103  2. ONSET: "When did the fever start?"     Monday evening 3. SYMPTOMS: "Do you have any other symptoms besides the fever?"  (e.g., colds, headache, sore throat, earache, cough, rash, diarrhea, vomiting, abdominal pain)     Chills body aches loss of appitite 4. CAUSE: If there are no symptoms, ask: "What do you think is causing the fever?"      unsure 5. CONTACTS: "Does anyone else in the family have an infection?"    no 6. TREATMENT: "What have you done so far to treat this fever?" (e.g., medications)     tylenol 7. IMMUNOCOMPROMISE: "Do you have of the following: diabetes, HIV positive, splenectomy, cancer chemotherapy, chronic steroid treatment, transplant patient, etc."     diabetes 8. PREGNANCY: "Is there any chance you are pregnant?" "When was your last menstrual period?"     N/A 9. TRAVEL: "Have you traveled out of the country in the last month?" (e.g., travel history, exposures)   no  Protocols used: FEVER-A-AH

## 2019-03-10 NOTE — ED Notes (Signed)
Pt given food tray and drink verbal okay from [place].

## 2019-03-15 LAB — CULTURE, BLOOD (ROUTINE X 2)
Culture: NO GROWTH
Culture: NO GROWTH
Special Requests: ADEQUATE

## 2019-03-17 ENCOUNTER — Encounter: Payer: Self-pay | Admitting: Family Medicine

## 2019-03-18 ENCOUNTER — Other Ambulatory Visit: Payer: Self-pay | Admitting: Family Medicine

## 2019-03-18 DIAGNOSIS — E119 Type 2 diabetes mellitus without complications: Secondary | ICD-10-CM

## 2019-03-18 MED ORDER — ATORVASTATIN CALCIUM 40 MG PO TABS: 40.0000 mg | ORAL_TABLET | Freq: Every day | ORAL | 4 refills | Status: DC

## 2019-03-18 MED ORDER — JARDIANCE 25 MG PO TABS: 25.0000 mg | ORAL_TABLET | Freq: Every day | ORAL | 4 refills | Status: DC

## 2019-03-18 MED ORDER — METFORMIN HCL ER 500 MG PO TB24: ORAL_TABLET | ORAL | 0 refills | Status: DC

## 2019-03-18 MED ORDER — METOPROLOL SUCCINATE ER 100 MG PO TB24: ORAL_TABLET | ORAL | 4 refills | Status: DC

## 2019-03-18 NOTE — Telephone Encounter (Signed)
Medication: Requesting a 90 day refill on the below atorvastatin (LIPITOR) 40 MG tablet [id number]  empagliflozin (JARDIANCE) 25 MG TABS tablet [id number]  metoprolol succinate (TOPROL-XL) 100 MG 24 hr tablet [id number]  metFORMIN (GLUCOPHAGE-XR) 500 MG 24 hr tablet [id number]   Has the patient contacted their pharmacy? Yes  (Agent: If no, request that the patient contact the pharmacy for the refill.) (Agent: If yes, when and what did the pharmacy advise?)  Preferred Pharmacy (with phone number or street name): [id number] [address], [place] [phone] (Phone) [phone] (Fax)  Agent: Please be advised that RX refills may take up to 3 business days. We ask that you follow-up with your pharmacy.

## 2019-03-18 NOTE — Telephone Encounter (Signed)
Requested medication (s) are due for refill today: yes  Requested medication (s) are on the active medication list: yes   Future visit scheduled: no  Notes to clinic: review for refill   Requested Prescriptions  Pending Prescriptions Disp Refills   metoprolol succinate (TOPROL-XL) 100 MG 24 hr tablet 90 tablet 4    Sig: TAKE 1 TABLET DAILY WITH ORIMMEDIATELY FOLLOWING A    MEAL     Cardiovascular:  Beta Blockers Failed - 03/18/2019  9:58 AM      Failed - Last BP in normal range    BP Readings from Last 1 Encounters:  03/10/19 (!) 165/85         Passed - Last Heart Rate in normal range    Pulse Readings from Last 1 Encounters:  03/10/19 78         Passed - Valid encounter within last 6 months    Recent Outpatient Visits          1 week ago [place] [facility] [place], MD   9 months ago Well adult exam   Therapist, music at [facility], Langley Adie, MD   9 months ago Essential hypertension   Therapist, [facility] at [facility], Langley Adie, MD   1 year ago Essential hypertension   Therapist, [facility] at [facility], Doretha Sou, MD   1 year ago Encounter for preventive health examination   Therapist, [facility] at [facility], Doretha Sou, MD              metFORMIN (GLUCOPHAGE-XR) 500 MG 24 hr tablet 180 tablet 0    Sig: TAKE 2 TABLETS BY MOUTH EVERY DAY WITH BREAKFAST     Endocrinology:  Diabetes - Biguanides Failed - 03/18/2019  9:58 AM      Failed - HBA1C is between 0 and 7.9 and within 180 days    Hgb A1c MFr Bld  Date Value Ref Range Status  06/21/2018 5.4 4.6 - 6.5 % Final    Comment:    Glycemic Control Guidelines for People with Diabetes:Non Diabetic:  <6%Goal of Therapy: <7%Additional Action Suggested:  >8%          Passed - Cr in normal range and within 360 days    Creatinine, Ser  Date Value Ref Range Status  03/10/2019 0.85 0.61 - 1.24 mg/dL Final         Passed - eGFR in normal range and within  360 days    GFR calc Af Amer  Date Value Ref Range Status  03/10/2019 >60 >60 mL/min Final   GFR calc non Af Amer  Date Value Ref Range Status  03/10/2019 >60 >60 mL/min Final   GFR  Date Value Ref Range Status  06/21/2018 141.79 >60.00 mL/min Final         Passed - Valid encounter within last 6 months    Recent Outpatient Visits          1 week ago [place] [facility], Langley Adie, MD   9 months ago Well adult exam   Therapist, [facility] at [facility], Langley Adie, MD   9 months ago Essential hypertension   Therapist, [facility] at [facility], Langley Adie, MD   1 year ago Essential hypertension   Therapist, [facility] at [facility], Doretha Sou, MD   1 year ago Encounter for preventive health examination   Therapist, music at [facility], Doretha Sou, MD  empagliflozin (JARDIANCE) 25 MG TABS tablet 90 tablet 4    Sig: Take 25 mg by mouth daily.     Endocrinology:  Diabetes - SGLT2 Inhibitors Failed - 03/18/2019  9:58 AM      Failed - HBA1C is between 0 and 7.9 and within 180 days    Hgb A1c MFr Bld  Date Value Ref Range Status  06/21/2018 5.4 4.6 - 6.5 % Final    Comment:    Glycemic Control Guidelines for People with Diabetes:Non Diabetic:  <6%Goal of Therapy: <7%Additional Action Suggested:  >8%          Passed - Cr in normal range and within 360 days    Creatinine, Ser  Date Value Ref Range Status  03/10/2019 0.85 0.61 - 1.24 mg/dL Final         Passed - LDL in normal range and within 360 days    LDL Cholesterol  Date Value Ref Range Status  06/21/2018 73 0 - 99 mg/dL Final         Passed - eGFR in normal range and within 360 days    GFR calc Af Amer  Date Value Ref Range Status  03/10/2019 >60 >60 mL/min Final   GFR calc non Af Amer  Date Value Ref Range Status  03/10/2019 >60 >60 mL/min Final   GFR  Date Value Ref Range Status  06/21/2018 141.79 >60.00 mL/min Final         Passed -  Valid encounter within last 6 months    Recent Outpatient Visits          1 week ago [facility] at [employer], MD   9 months ago Well adult exam   Therapist, [facility] at [facility], Langley Adie, MD   9 months ago Essential hypertension   Therapist, [facility] at [facility], Langley Adie, MD   1 year ago Essential hypertension   Therapist, [facility] at [facility], Doretha Sou, MD   1 year ago Encounter for preventive health examination   Therapist, music at [facility], Doretha Sou, MD              atorvastatin (LIPITOR) 40 MG tablet 90 tablet 4    Sig: Take 1 tablet (40 mg total) by mouth daily.     Cardiovascular:  Antilipid - Statins Failed - 03/18/2019  9:58 AM      Failed - HDL in normal range and within 360 days    HDL  Date Value Ref Range Status  06/21/2018 37.00 (L) >39.00 mg/dL Final         Passed - Total Cholesterol in normal range and within 360 days    Cholesterol  Date Value Ref Range Status  06/21/2018 125 0 - 200 mg/dL Final    Comment:    ATP III Classification       Desirable:  < 200 mg/dL               Borderline High:  200 - 239 mg/dL          High:  > = 240 mg/dL         Passed - LDL in normal range and within 360 days    LDL Cholesterol  Date Value Ref Range Status  06/21/2018 73 0 - 99 mg/dL Final         Passed - Triglycerides in normal range and within 360 days    Triglycerides  Date Value Ref Range  Status  06/21/2018 76.0 0.0 - 149.0 mg/dL Final    Comment:    Normal:  <150 mg/dLBorderline High:  150 - 199 mg/dL         Passed - Patient is not pregnant      Passed - Valid encounter within last 12 months    Recent Outpatient Visits          1 week ago [facility] [place], MD   9 months ago Well adult exam   Therapist, music at [place], MD   9 months ago Essential hypertension   Therapist, [facility] at [facility],  Langley Adie, MD   1 year ago Essential hypertension   Therapist, [facility] at [facility], Doretha Sou, MD   1 year ago Encounter for preventive health examination   Therapist, [facility] at [facility], Doretha Sou, MD

## 2019-03-18 NOTE — Telephone Encounter (Signed)
Pt seen at the ED

## 2019-04-22 ENCOUNTER — Telehealth (INDEPENDENT_AMBULATORY_CARE_PROVIDER_SITE_OTHER): Payer: BC Managed Care – PPO | Admitting: Family Medicine

## 2019-04-22 ENCOUNTER — Other Ambulatory Visit: Payer: Self-pay

## 2019-04-22 DIAGNOSIS — Z8701 Personal history of pneumonia (recurrent): Secondary | ICD-10-CM

## 2019-04-22 NOTE — Progress Notes (Signed)
Virtual Visit via Telephone Note  I connected with Robert Mcguire on 04/22/19 at 10:30 AM EDT by telephone and verified that I am speaking with the correct person using two identifiers.   I discussed the limitations, risks, security and privacy concerns of performing an evaluation and management service by telephone and the availability of in person appointments. I also discussed with the patient that there may be a patient responsible charge related to this service. The patient expressed understanding and agreed to proceed.  Location patient: home Location provider: work or home office Participants present for the call: patient, provider Patient did not have a visit in the prior 7 days to address this/these issue(s).   History of Present Illness: Pt was hospitalized 9/3 for pneumonia.  Pt had a h/o fever Tmax 103, chills fore several days prior to being admitted.  Pt had negative COVID testing.  CXR with pneumonia of LLL.  Pt started on abx and d/c'd.  Since d/c pt endorses feeling better.  He states his energy is back.  Pt denies SOB, CP, fever, chills, n/v.  Pt works in [place], [place].  Having Xray on the wknd would be easier for him.   Observations/Objective: Patient sounds cheerful and well on the phone. I do not appreciate any SOB. Speech and thought processing are grossly intact. Patient reported vitals:  Assessment and Plan: History of community acquired pneumonia  -recovered. -discussed f/u CXR to assess resolution of infection.  CXR 9/3 with heterogenous opacity of L midlung.  6-8 wk f/u suggested to exclude malignancy.   - Plan: DG Chest 2 View    Follow Up Instructions: F/u prn.  CPE in Dec 2020  I did not refer this patient for an OV in the next 24 hours for this/these issue(s).  I discussed the assessment and treatment plan with the patient. The patient was provided an opportunity to ask questions and all were answered. The patient agreed with the plan and  demonstrated an understanding of the instructions.   The patient was advised to call back or seek an in-person evaluation if the symptoms worsen or if the condition fails to improve as anticipated.  I provided 6 minutes of non-face-to-face time during this encounter.   [name], MD

## 2019-04-26 ENCOUNTER — Encounter: Payer: Self-pay | Admitting: Family Medicine

## 2019-04-29 NOTE — Telephone Encounter (Deleted)
Copied from [place] [id number]. Topic: General - Other >> Apr 29, 2019  9:46 AM Rainey Pines A wrote: Patient is asking that we send him a mychart message with the number to call once he gets to the office to get his imaging done. Gave patient phone number during call, however he still wants it in his mychart.

## 2019-04-29 NOTE — Telephone Encounter (Signed)
Copied from [place] [id number]. Topic: General - Other >> Apr 29, 2019  9:46 AM Rainey Pines A wrote: Patient is asking that we send him a mychart message with the number to call once he gets to the office to get his imaging done. Gave patient phone number during call, however he still wants it in his mychart.

## 2019-05-02 NOTE — Telephone Encounter (Signed)
Looks like pt has been scheduled

## 2019-05-03 ENCOUNTER — Other Ambulatory Visit (INDEPENDENT_AMBULATORY_CARE_PROVIDER_SITE_OTHER): Payer: BC Managed Care – PPO

## 2019-05-03 ENCOUNTER — Other Ambulatory Visit: Payer: Self-pay

## 2019-05-03 ENCOUNTER — Other Ambulatory Visit: Payer: Self-pay | Admitting: Family Medicine

## 2019-05-03 DIAGNOSIS — I10 Essential (primary) hypertension: Secondary | ICD-10-CM

## 2019-05-03 DIAGNOSIS — Z0000 Encounter for general adult medical examination without abnormal findings: Secondary | ICD-10-CM | POA: Diagnosis not present

## 2019-05-03 LAB — CBC WITH DIFFERENTIAL/PLATELET
Basophils Absolute: 0 10*3/uL (ref 0.0–0.1)
Basophils Relative: 0.3 % (ref 0.0–3.0)
Eosinophils Absolute: 0 10*3/uL (ref 0.0–0.7)
Eosinophils Relative: 0.7 % (ref 0.0–5.0)
HCT: 49.9 % (ref 39.0–52.0)
Hemoglobin: 17.4 g/dL — ABNORMAL HIGH (ref 13.0–17.0)
Lymphocytes Relative: 40.6 % (ref 12.0–46.0)
Lymphs Abs: 1.7 10*3/uL (ref 0.7–4.0)
MCHC: 34.8 g/dL (ref 30.0–36.0)
MCV: 88.7 fl (ref 78.0–100.0)
Monocytes Absolute: 0.3 10*3/uL (ref 0.1–1.0)
Monocytes Relative: 7.7 % (ref 3.0–12.0)
Neutro Abs: 2.2 10*3/uL (ref 1.4–7.7)
Neutrophils Relative %: 50.7 % (ref 43.0–77.0)
Platelets: 187 10*3/uL (ref 150.0–400.0)
RBC: 5.63 Mil/uL (ref 4.22–5.81)
RDW: 15.1 % (ref 11.5–15.5)
WBC: 4.3 10*3/uL (ref 4.0–10.5)

## 2019-05-04 ENCOUNTER — Other Ambulatory Visit: Payer: Self-pay

## 2019-05-04 ENCOUNTER — Ambulatory Visit
Admission: RE | Admit: 2019-05-04 | Discharge: 2019-05-04 | Disposition: A | Discharge status: Home / Self Care | Payer: BC Managed Care – PPO | Attending: Family Medicine | Admitting: Family Medicine

## 2019-05-04 ENCOUNTER — Ambulatory Visit
Admission: RE | Admit: 2019-05-04 | Discharge: 2019-05-04 | Disposition: A | Discharge status: Home / Self Care | Payer: BC Managed Care – PPO | Source: Ambulatory Visit | Attending: Family Medicine | Admitting: Family Medicine

## 2019-05-04 DIAGNOSIS — H401131 Primary open-angle glaucoma, bilateral, mild stage: Secondary | ICD-10-CM | POA: Diagnosis not present

## 2019-05-04 DIAGNOSIS — Z8701 Personal history of pneumonia (recurrent): Secondary | ICD-10-CM | POA: Insufficient documentation

## 2019-05-04 DIAGNOSIS — J189 Pneumonia, unspecified organism: Secondary | ICD-10-CM | POA: Diagnosis not present

## 2019-05-12 DIAGNOSIS — H401131 Primary open-angle glaucoma, bilateral, mild stage: Secondary | ICD-10-CM | POA: Diagnosis not present

## 2019-05-30 DIAGNOSIS — N403 Nodular prostate with lower urinary tract symptoms: Secondary | ICD-10-CM | POA: Diagnosis not present

## 2019-05-30 DIAGNOSIS — R3912 Poor urinary stream: Secondary | ICD-10-CM | POA: Diagnosis not present

## 2019-05-30 DIAGNOSIS — N5201 Erectile dysfunction due to arterial insufficiency: Secondary | ICD-10-CM | POA: Diagnosis not present

## 2019-07-15 ENCOUNTER — Ambulatory Visit: Payer: 59 | Attending: Internal Medicine

## 2019-07-15 DIAGNOSIS — Z20822 Contact with and (suspected) exposure to covid-19: Secondary | ICD-10-CM

## 2019-07-17 LAB — NOVEL CORONAVIRUS, NAA: SARS-CoV-2, NAA: NOT DETECTED

## 2019-07-27 ENCOUNTER — Other Ambulatory Visit: Payer: Self-pay

## 2019-07-27 ENCOUNTER — Ambulatory Visit (INDEPENDENT_AMBULATORY_CARE_PROVIDER_SITE_OTHER): Payer: 59 | Admitting: Family Medicine

## 2019-07-27 ENCOUNTER — Encounter: Payer: Self-pay | Admitting: Family Medicine

## 2019-07-27 VITALS — BP 120/78 | HR 78 | Temp 97.8°F | Wt 237.0 lb

## 2019-07-27 DIAGNOSIS — E782 Mixed hyperlipidemia: Secondary | ICD-10-CM | POA: Diagnosis not present

## 2019-07-27 DIAGNOSIS — Z0000 Encounter for general adult medical examination without abnormal findings: Secondary | ICD-10-CM | POA: Diagnosis not present

## 2019-07-27 DIAGNOSIS — I10 Essential (primary) hypertension: Secondary | ICD-10-CM

## 2019-07-27 DIAGNOSIS — Z125 Encounter for screening for malignant neoplasm of prostate: Secondary | ICD-10-CM

## 2019-07-27 DIAGNOSIS — L918 Other hypertrophic disorders of the skin: Secondary | ICD-10-CM | POA: Diagnosis not present

## 2019-07-27 DIAGNOSIS — E119 Type 2 diabetes mellitus without complications: Secondary | ICD-10-CM

## 2019-07-27 LAB — CBC WITH DIFFERENTIAL/PLATELET
Basophils Absolute: 0 10*3/uL (ref 0.0–0.1)
Basophils Relative: 0.2 % (ref 0.0–3.0)
Eosinophils Absolute: 0 10*3/uL (ref 0.0–0.7)
Eosinophils Relative: 0.6 % (ref 0.0–5.0)
HCT: 48.1 % (ref 39.0–52.0)
Hemoglobin: 16.6 g/dL (ref 13.0–17.0)
Lymphocytes Relative: 34.1 % (ref 12.0–46.0)
Lymphs Abs: 1.5 10*3/uL (ref 0.7–4.0)
MCHC: 34.4 g/dL (ref 30.0–36.0)
MCV: 88.5 fl (ref 78.0–100.0)
Monocytes Absolute: 0.4 10*3/uL (ref 0.1–1.0)
Monocytes Relative: 8.7 % (ref 3.0–12.0)
Neutro Abs: 2.5 10*3/uL (ref 1.4–7.7)
Neutrophils Relative %: 56.4 % (ref 43.0–77.0)
Platelets: 178 10*3/uL (ref 150.0–400.0)
RBC: 5.44 Mil/uL (ref 4.22–5.81)
RDW: 14.3 % (ref 11.5–15.5)
WBC: 4.5 10*3/uL (ref 4.0–10.5)

## 2019-07-27 LAB — BASIC METABOLIC PANEL
BUN: 15 mg/dL (ref 6–23)
CO2: 28 mEq/L (ref 19–32)
Calcium: 10 mg/dL (ref 8.4–10.5)
Chloride: 102 mEq/L (ref 96–112)
Creatinine, Ser: 0.68 mg/dL (ref 0.40–1.50)
GFR: 144.24 mL/min (ref >60.00)
Glucose, Bld: 93 mg/dL (ref 70–99)
Potassium: 4.1 mEq/L (ref 3.5–5.1)
Sodium: 138 mEq/L (ref 135–145)

## 2019-07-27 LAB — LIPID PANEL
Cholesterol: 125 mg/dL (ref 0–200)
HDL: 41.8 mg/dL (ref >39.00)
LDL Cholesterol: 67 mg/dL (ref 0–99)
NonHDL: 83.67
Total CHOL/HDL Ratio: 3
Triglycerides: 84 mg/dL (ref 0.0–149.0)
VLDL: 16.8 mg/dL (ref 0.0–40.0)

## 2019-07-27 LAB — MICROALBUMIN / CREATININE URINE RATIO
Creatinine,U: 111.2 mg/dL
Microalb Creat Ratio: 39.9 mg/g — ABNORMAL HIGH (ref 0.0–30.0)
Microalb, Ur: 44.3 mg/dL — ABNORMAL HIGH (ref 0.0–1.9)

## 2019-07-27 LAB — HEMOGLOBIN A1C: Hgb A1c MFr Bld: 5.2 % (ref 4.6–6.5)

## 2019-07-27 MED ORDER — AMLODIPINE BESYLATE 10 MG PO TABS: 10.0000 mg | ORAL_TABLET | Freq: Every day | ORAL | 3 refills | Status: DC

## 2019-07-27 MED ORDER — SILDENAFIL CITRATE 100 MG PO TABS: 100.0000 mg | ORAL_TABLET | Freq: Every day | ORAL | 6 refills | Status: DC | PRN

## 2019-07-27 MED ORDER — BENAZEPRIL HCL 40 MG PO TABS: 40.0000 mg | ORAL_TABLET | Freq: Every day | ORAL | 3 refills | Status: DC

## 2019-07-27 MED ORDER — METFORMIN HCL ER 500 MG PO TB24: ORAL_TABLET | ORAL | 3 refills | Status: DC

## 2019-07-27 NOTE — Patient Instructions (Signed)
Preventive Care 41-60 Years Old, Male Preventive care refers to lifestyle choices and visits with your health care provider that can promote health and wellness. This includes:  A yearly physical exam. This is also called an annual well check.  Regular dental and eye exams.  Immunizations.  Screening for certain conditions.  Healthy lifestyle choices, such as eating a healthy diet, getting regular exercise, not using drugs or products that contain nicotine and tobacco, and limiting alcohol use. What can I expect for my preventive care visit? Physical exam Your health care provider will check:  Height and weight. These may be used to calculate body mass index (BMI), which is a measurement that tells if you are at a healthy weight.  Heart rate and blood pressure.  Your skin for abnormal spots. Counseling Your health care provider may ask you questions about:  Alcohol, tobacco, and drug use.  Emotional well-being.  Home and relationship well-being.  Sexual activity.  Eating habits.  Work and work Statistician. What immunizations do I need?  Influenza (flu) vaccine  This is recommended every year. Tetanus, diphtheria, and pertussis (Tdap) vaccine  You may need a Td booster every 10 years. Varicella (chickenpox) vaccine  You may need this vaccine if you have not already been vaccinated. Zoster (shingles) vaccine  You may need this after age 64. Measles, mumps, and rubella (MMR) vaccine  You may need at least one dose of MMR if you were born in 1957 or later. You may also need a second dose. Pneumococcal conjugate (PCV13) vaccine  You may need this if you have certain conditions and were not previously vaccinated. Pneumococcal polysaccharide (PPSV23) vaccine  You may need one or two doses if you smoke cigarettes or if you have certain conditions. Meningococcal conjugate (MenACWY) vaccine  You may need this if you have certain conditions. Hepatitis A  vaccine  You may need this if you have certain conditions or if you travel or work in places where you may be exposed to hepatitis A. Hepatitis B vaccine  You may need this if you have certain conditions or if you travel or work in places where you may be exposed to hepatitis B. Haemophilus influenzae type b (Hib) vaccine  You may need this if you have certain risk factors. Human papillomavirus (HPV) vaccine  If recommended by your health care provider, you may need three doses over 6 months. You may receive vaccines as individual doses or as more than one vaccine together in one shot (combination vaccines). Talk with your health care provider about the risks and benefits of combination vaccines. What tests do I need? Blood tests  Lipid and cholesterol levels. These may be checked every 5 years, or more frequently if you are over 60 years old.  Hepatitis C test.  Hepatitis B test. Screening  Lung cancer screening. You may have this screening every year starting at age 43 if you have a 30-pack-year history of smoking and currently smoke or have quit within the past 15 years.  Prostate cancer screening. Recommendations will vary depending on your family history and other risks.  Colorectal cancer screening. All adults should have this screening starting at age 72 and continuing until age 2. Your health care provider may recommend screening at age 14 if you are at increased risk. You will have tests every 1-10 years, depending on your results and the type of screening test.  Diabetes screening. This is done by checking your blood sugar (glucose) after you have not eaten  for a while (fasting). You may have this done every 1-3 years.  Sexually transmitted disease (STD) testing. Follow these instructions at home: Eating and drinking  Eat a diet that includes fresh fruits and vegetables, whole grains, lean protein, and low-fat dairy products.  Take vitamin and mineral supplements as  recommended by your health care provider.  Do not drink alcohol if your health care provider tells you not to drink.  If you drink alcohol: ? Limit how much you have to 0-2 drinks a day. ? Be aware of how much alcohol is in your drink. In the [place] one drink equals one 12 oz bottle of beer (355 mL), one 5 oz glass of wine (148 mL), or one 1 oz glass of hard liquor (44 mL). Lifestyle  Take daily care of your teeth and gums.  Stay active. Exercise for at least 30 minutes on 5 or more days each week.  Do not use any products that contain nicotine or tobacco, such as cigarettes, e-cigarettes, and chewing tobacco. If you need help quitting, ask your health care provider.  If you are sexually active, practice safe sex. Use a condom or other form of protection to prevent STIs (sexually transmitted infections).  Talk with your health care provider about taking a low-dose aspirin every day starting at age 12. What's next?  Go to your health care provider once a year for a well check visit.  Ask your health care provider how often you should have your eyes and teeth checked.  Stay up to date on all vaccines. This information is not intended to replace advice given to you by your health care provider. Make sure you discuss any questions you have with your health care provider. Document Revised: 06/17/2018 Document Reviewed: 06/17/2018 Elsevier Patient Education  2020 [place].  Diabetes Mellitus and Standards of Medical Care Managing diabetes (diabetes mellitus) can be complicated. Your diabetes treatment may be managed by a team of health care providers, including:  A physician who specializes in diabetes (endocrinologist).  A nurse practitioner or physician assistant.  Nurses.  A diet and nutrition specialist (registered dietitian).  A certified diabetes educator (CDE).  An exercise specialist.  A pharmacist.  An eye doctor.  A foot specialist (podiatrist).  A  dentist.  A primary care provider.  A mental health provider. Your health care providers follow guidelines to help you get the best quality of care. The following schedule is a general guideline for your diabetes management plan. Your health care providers may give you more specific instructions. Physical exams Upon being diagnosed with diabetes mellitus, and each year after that, your health care provider will ask about your medical and family history. He or she will also do a physical exam. Your exam may include:  Measuring your height, weight, and body mass index (BMI).  Checking your blood pressure. This will be done at every routine medical visit. Your target blood pressure may vary depending on your medical conditions, your age, and other factors.  Thyroid gland exam.  Skin exam.  Screening for damage to your nerves (peripheral neuropathy). This may include checking the pulse in your legs and feet and checking the level of sensation in your hands and feet.  A complete foot exam to inspect the structure and skin of your feet, including checking for cuts, bruises, redness, blisters, sores, or other problems.  Screening for blood vessel (vascular) problems, which may include checking the pulse in your legs and feet and checking your temperature.  Blood tests Depending on your treatment plan and your personal needs, you may have the following tests done:  HbA1c (hemoglobin A1c). This test provides information about blood sugar (glucose) control over the previous 2-3 months. It is used to adjust your treatment plan, if needed. This test will be done: ? At least 2 times a year, if you are meeting your treatment goals. ? 4 times a year, if you are not meeting your treatment goals or if treatment goals have changed.  Lipid testing, including total, LDL, and HDL cholesterol and triglyceride levels. ? The goal for LDL is less than 100 mg/dL (5.5 mmol/L). If you are at high risk for  complications, the goal is less than 70 mg/dL (3.9 mmol/L). ? The goal for HDL is 40 mg/dL (2.2 mmol/L) or higher for men and 50 mg/dL (2.8 mmol/L) or higher for women. An HDL cholesterol of 60 mg/dL (3.3 mmol/L) or higher gives some protection against heart disease. ? The goal for triglycerides is less than 150 mg/dL (8.3 mmol/L).  Liver function tests.  Kidney function tests.  Thyroid function tests. Dental and eye exams  Visit your dentist two times a year.  If you have type 1 diabetes, your health care provider may recommend an eye exam 3-5 years after you are diagnosed, and then once a year after your first exam. ? For children with type 1 diabetes, a health care provider may recommend an eye exam when your child is age 21 or older and has had diabetes for 3-5 years. After the first exam, your child should get an eye exam once a year.  If you have type 2 diabetes, your health care provider may recommend an eye exam as soon as you are diagnosed, and then once a year after your first exam. Immunizations   The yearly flu (influenza) vaccine is recommended for everyone 6 months or older who has diabetes.  The pneumonia (pneumococcal) vaccine is recommended for everyone 2 years or older who has diabetes. If you are 65 or older, you may get the pneumonia vaccine as a series of two separate shots.  The hepatitis B vaccine is recommended for adults shortly after being diagnosed with diabetes.  Adults and children with diabetes should receive all other vaccines according to age-specific recommendations from the Centers for Disease Control and Prevention (CDC). Mental and emotional health Screening for symptoms of eating disorders, anxiety, and depression is recommended at the time of diagnosis and afterward as needed. If your screening shows that you have symptoms (positive screening result), you may need more evaluation and you may work with a mental health care provider. Treatment  plan Your treatment plan will be reviewed at every medical visit. You and your health care provider will discuss:  How you are taking your medicines, including insulin.  Any side effects you are experiencing.  Your blood glucose target goals.  The frequency of your blood glucose monitoring.  Lifestyle habits, such as activity level as well as tobacco, alcohol, and substance use. Diabetes self-management education Your health care provider will assess how well you are monitoring your blood glucose levels and whether you are taking your insulin correctly. He or she may refer you to:  A certified diabetes educator to manage your diabetes throughout your life, starting at diagnosis.  A registered dietitian who can create or review your personal nutrition plan.  An exercise specialist who can discuss your activity level and exercise plan. Summary  Managing diabetes (diabetes mellitus) can be complicated.  Your diabetes treatment may be managed by a team of health care providers.  Your health care providers follow guidelines in order to help you get the best quality of care.  Standards of care including having regular physical exams, blood tests, blood pressure monitoring, immunizations, screening tests, and education about how to manage your diabetes.  Your health care providers may also give you more specific instructions based on your individual health. This information is not intended to replace advice given to you by your health care provider. Make sure you discuss any questions you have with your health care provider. Document Revised: 03/12/2018 Document Reviewed: 03/21/2016 Elsevier Patient Education  2020 [place], Adult  A skin tag (acrochordon) is a soft, extra growth of skin. Most skin tags are flesh-colored and rarely bigger than a pencil eraser. They commonly form near areas where there are folds in the skin, such as the armpit or groin. Skin tags are not  dangerous, and they do not spread from person to person (are not contagious). You may have one skin tag or several. Skin tags do not require treatment. However, your health care provider may recommend removal of a skin tag if it:  Gets irritated from clothing.  Bleeds.  Is visible and unsightly. Your health care provider can remove skin tags with a simple surgical procedure or a procedure that involves freezing the skin tag. Follow these instructions at home:  Watch for any changes in your skin tag. A normal skin tag does not require any other special care at home.  Take over-the-counter and prescription medicines only as told by your health care provider.  Keep all follow-up visits as told by your health care provider. This is important. Contact a health care provider if:  You have a skin tag that: ? Becomes painful. ? Changes color. ? Bleeds. ? Swells.  You develop more skin tags. This information is not intended to replace advice given to you by your health care provider. Make sure you discuss any questions you have with your health care provider. Document Revised: 06/05/2017 Document Reviewed: 07/08/2015 Elsevier Patient Education  2020 [place].  Cryosurgery for Skin Conditions, Care After These instructions give you information on caring for yourself after your procedure. Your doctor may also give you more specific instructions. Call your doctor if you have any problems or questions after your procedure. Follow these instructions at home: Caring for the treated area   Follow instructions from your doctor about how to take care of the treated area. Make sure you: ? Keep the area covered with a bandage (dressing) until it heals, or for as long as told by your doctor. ? Wash your hands with soap and water before you change your bandage. If you do not have soap and water, use hand sanitizer. ? Change your bandage as told by your doctor. ? Keep the bandage and the treated  area clean and dry. If the bandage gets wet, change it right away. ? Clean the treated area with soap and water.  Check the treated area every day for signs of infection. Check for: ? More redness, swelling, or pain. ? More fluid or blood. ? Warmth. ? Pus or a bad smell. General instructions  Do not pick at your blister. Do not try to break it open. This can cause infection and scarring.  Do not put any medicine, cream, or lotion on the treated area unless told by your doctor.  Take over-the-counter and prescription medicines  only as told by your doctor.  Keep all follow-up visits as told by your doctor. This is important. Contact a doctor if:  You have more redness, swelling, or pain around the treated area.  You have more fluid or blood coming from the treated area.  The treated area feels warm to the touch.  You have pus or a bad smell coming from the treated area.  Your blister gets large and painful. Get help right away if:  You have a fever and have redness spreading from the treated area. Summary  You should keep the treated area and your bandage clean and dry.  Check the treated area every day for signs of infection. Signs include fluid, pus, warmth, or having more redness, swelling, or pain.  Do not pick at your blister. Do not try to break it open. This information is not intended to replace advice given to you by your health care provider. Make sure you discuss any questions you have with your health care provider. Document Revised: 06/05/2017 Document Reviewed: 05/12/2016 Elsevier Patient Education  2020 [employer].

## 2019-07-27 NOTE — Progress Notes (Signed)
Subjective:     Robert Mcguire is a 59 y.o. male and is here for a comprehensive physical exam. The patient reports problems - R flank discomfort.  Pt unsure if related to sleep number mattress.  Pt notes occasional R flank discomfort.  Unable to reproduce the feeling and notes difficulty describing it.  Patient denies back pain, dysuria, pain that moves, nausea, vomiting.  Blood sugar and BP stable on current meds.  Patient states he has been busy at work since the pandemic started.  Pt notes doing well overall.  Had an episode of pneumonia in Oct 2020.  Pt inquires about the COVID-19 vaccine.  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  Social History   Socioeconomic History  . Marital status: Married    Spouse name: Not on file  . Number of children: Not on file  . Years of education: Not on file  . Highest education level: Not on file  Occupational History  . Not on file  Tobacco Use  . Smoking status: Never Smoker  . Smokeless tobacco: Never Used  Substance and Sexual Activity  . Alcohol use: Yes    Alcohol/week: 0.0 standard drinks    Comment: occasionally  . Drug use: No  . Sexual activity: Not on file  Other Topics Concern  . Not on file  Social History Narrative  . Not on file   Social Determinants of Health   Financial Resource Strain:   . Difficulty of Paying Living Expenses: Not on file  Food Insecurity:   . Worried About Charity fundraiser in the Last Year: Not on file  . Ran Out of Food in the Last Year: Not on file  Transportation Needs:   . Lack of Transportation (Medical): Not on file  . Lack of Transportation (Non-Medical): Not on file  Physical Activity:   . Days of Exercise per Week: Not on file  . Minutes of Exercise per Session: Not on file  Stress:   . Feeling of Stress : Not on file  Social Connections:   . Frequency of Communication with Friends and Family: Not on file  . Frequency of Social Gatherings with Friends and  Family: Not on file  . Attends Religious Services: Not on file  . Active Member of Clubs or Organizations: Not on file  . Attends Archivist Meetings: Not on file  . Marital Status: Not on file  Intimate Partner Violence:   . Fear of Current or Ex-Partner: Not on file  . Emotionally Abused: Not on file  . Physically Abused: Not on file  . Sexually Abused: Not on file   Health Maintenance  Topic Date Due  . Hepatitis C Screening  [birth year]  . PNEUMOCOCCAL POLYSACCHARIDE VACCINE AGE 32-64 HIGH RISK  03/14/1962  . HIV Screening  03/15/1975  . HEMOGLOBIN A1C  12/21/2018  . FOOT EXAM  06/22/2019  . OPHTHALMOLOGY EXAM  11/10/2019  . COLONOSCOPY  12/23/2023  . TETANUS/TDAP  03/21/2025  . INFLUENZA VACCINE  Completed    The following portions of the patient's history were reviewed and updated as appropriate: allergies, current medications, past family history, past medical history, past social history, past surgical history and problem list.  Review of Systems Pertinent items noted in HPI and remainder of comprehensive ROS otherwise negative.   Objective:    BP 120/78 (BP Location: Left Arm, Patient Position: Sitting, Cuff Size: Large)   Pulse 78   Temp  97.8 F (36.6 C) (Temporal)   Wt 237 lb (107.5 kg)   SpO2 98%   BMI 37.12 kg/m  General appearance: alert, cooperative and no distress Head: Normocephalic, without obvious abnormality, atraumatic Eyes: conjunctivae/corneas clear. PERRL, EOM's intact. Fundi benign. Ears: normal TM's and external ear canals both ears Nose: Nares normal. Septum midline. Mucosa normal. No drainage or sinus tenderness. Throat: lips, mucosa, and tongue normal; teeth and gums normal Neck: no adenopathy, no carotid bruit, no JVD, supple, symmetrical, trachea midline and thyroid not enlarged, symmetric, no tenderness/mass/nodules Lungs: clear to auscultation bilaterally Heart: regular rate and rhythm, S1, S2 normal, no murmur, click, rub or  gallop Abdomen: soft, non-tender; bowel sounds normal; no masses,  no organomegaly, no CVA tenderness Extremities: extremities normal, atraumatic, no cyanosis or edema.  No TTP of spine or paraspinal muscles or b/l flanks. Pulses: 2+ and symmetric Skin: Skin color, texture, turgor normal. No rashes or lesions or skin tag R flank ~4-35mm with small stalk. Lymph nodes: Cervical, supraclavicular, and axillary nodes normal. Neurologic: Alert and oriented X 3, normal strength and tone. Normal symmetric reflexes. Normal coordination and gait    Assessment:    Healthy male exam with skin tag R upper flank.  Plan:     Anticipatory guidance given including wearing seatbelts, smoke detectors in the home, increasing physical activity, increasing p.o. intake of water and vegetables. -will obtain labs -colonoscopy done 12/23/2018 -given handouts -next CPE in 1 yr /See After Visit Summary for Counseling Recommendations   Skin tag -consent obtained.  Cryotherapy done and skin tag clipped.  Pt tolerated procedure well.  Minimal bleeding.  Area dressed with gauze a paper tape -given handouts  Essential hypertension  -controlled -continue current meds: Norvasc 10 mg, benazepril 40 mg, Toprol-XL 100 mg -refills given -Continue to check BP daily -Continue lifestyle modifications - Plan: Basic metabolic panel  Type 2 diabetes mellitus without complication, without long-term current use of insulin ([place])  -well controlled -last hgb A1C 5.4% 06/21/18 -will obtain Hgb A1C.  Consider decreasing medication based on labs this visit -for now continue metformin XR 500 mg 2 tabs in a.m., Jardiance 25 mg.  Refills provided - Plan: Hemoglobin A1c, Lipid panel, Microalbumin/Creatinine Ratio, Urine, POCT urinalysis dipstick  Hyperlipidemia, mixed -Continue Lipitor 40 mg -Discussed lifestyle modifications - Plan: Lipid panel  F/u in 4-6 months, sooner if needed  Grier Mitts, MD

## 2020-01-02 IMAGING — CR DG CHEST 2V
1 series · 3 of 3 positions shown · non-contrast
Comparison: 03/10/2019

CLINICAL DATA: Left side pneumonia, follow-up

EXAM:
CHEST - 2 VIEW

[Series 1: dg chest 2 view · 0.14mm/px · 3 of 3 slices shown]
[im 1/3]
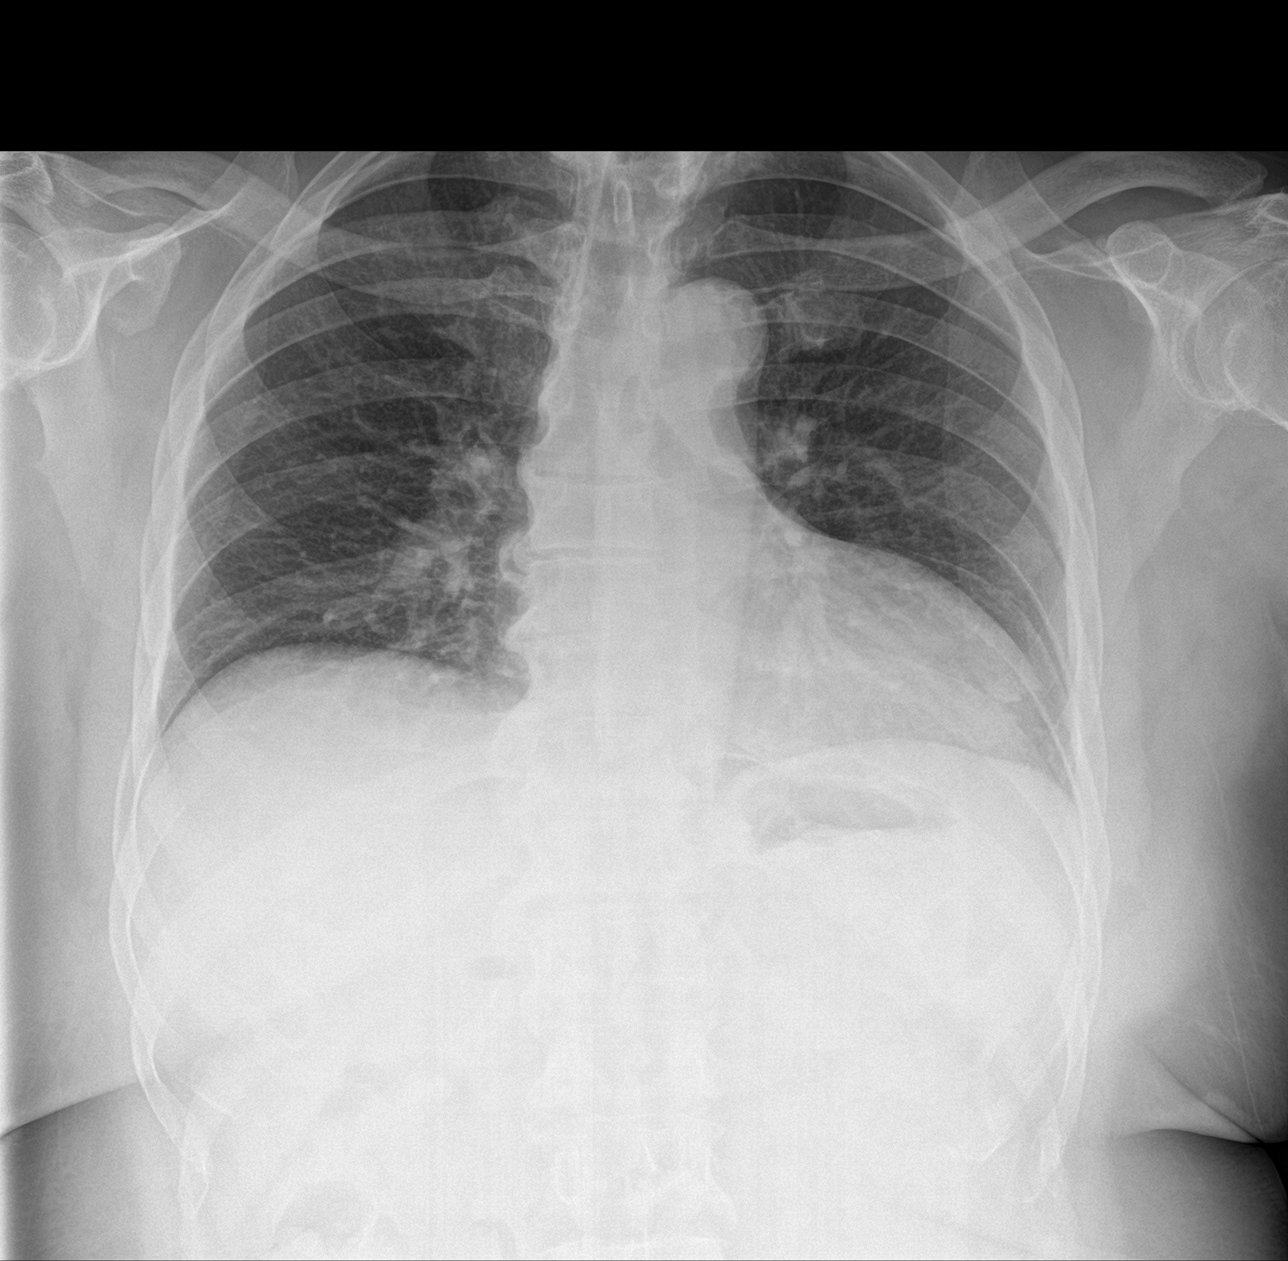
[im 2/3]
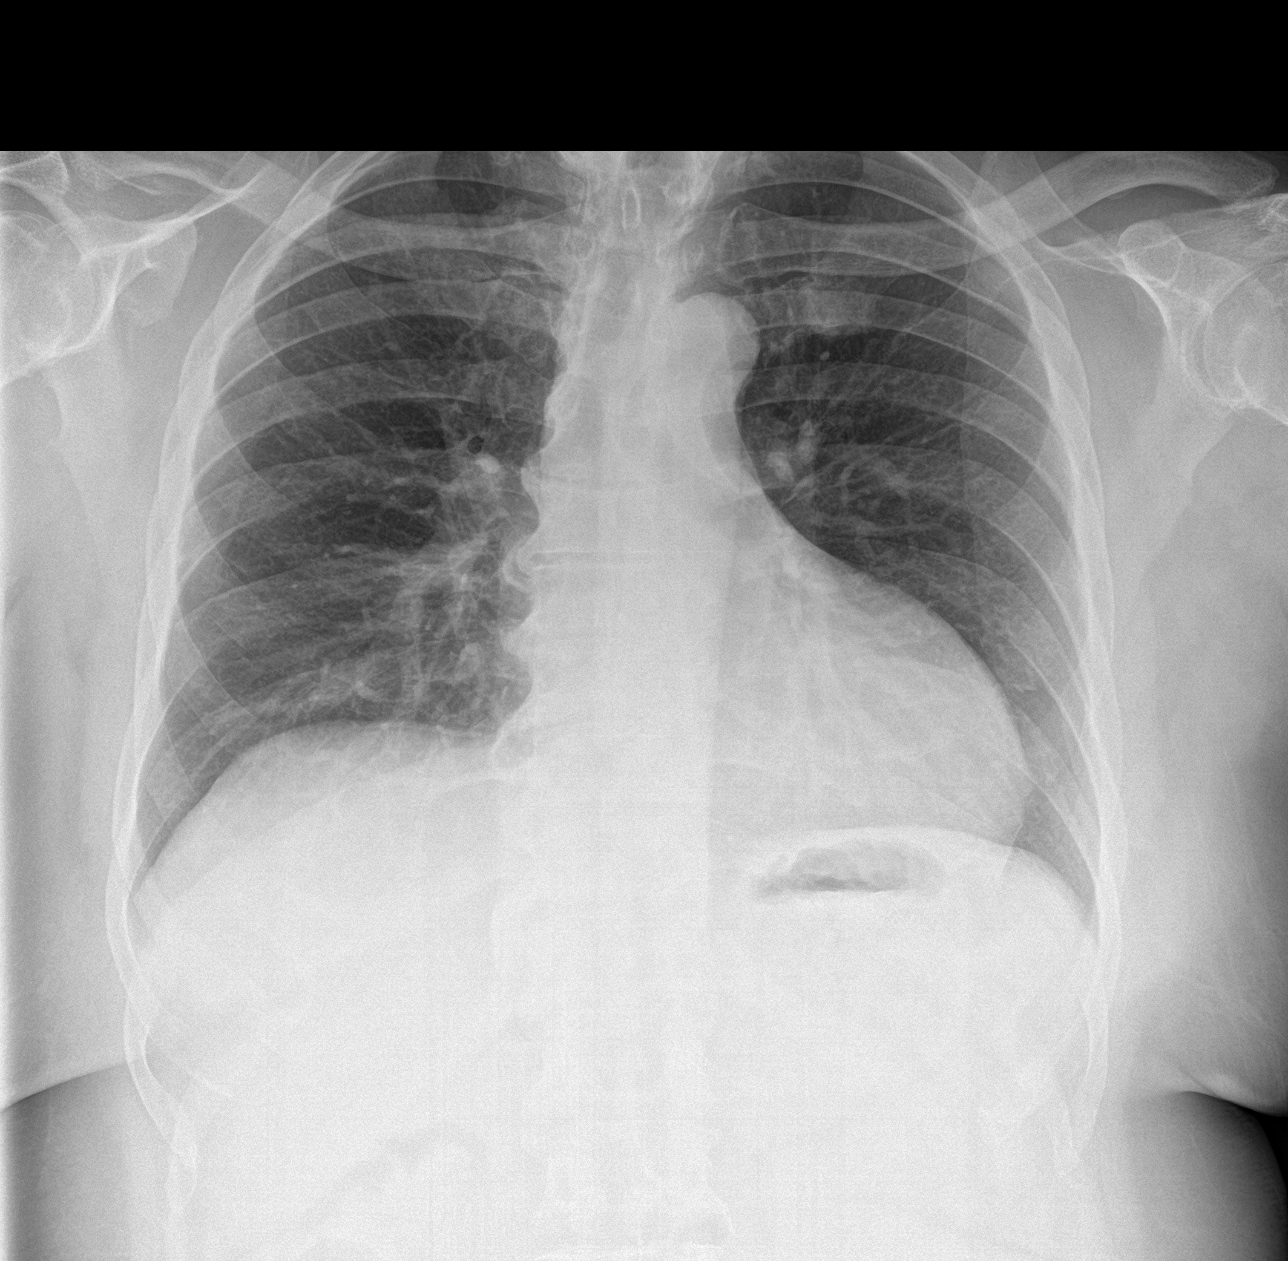
[im 3/3]
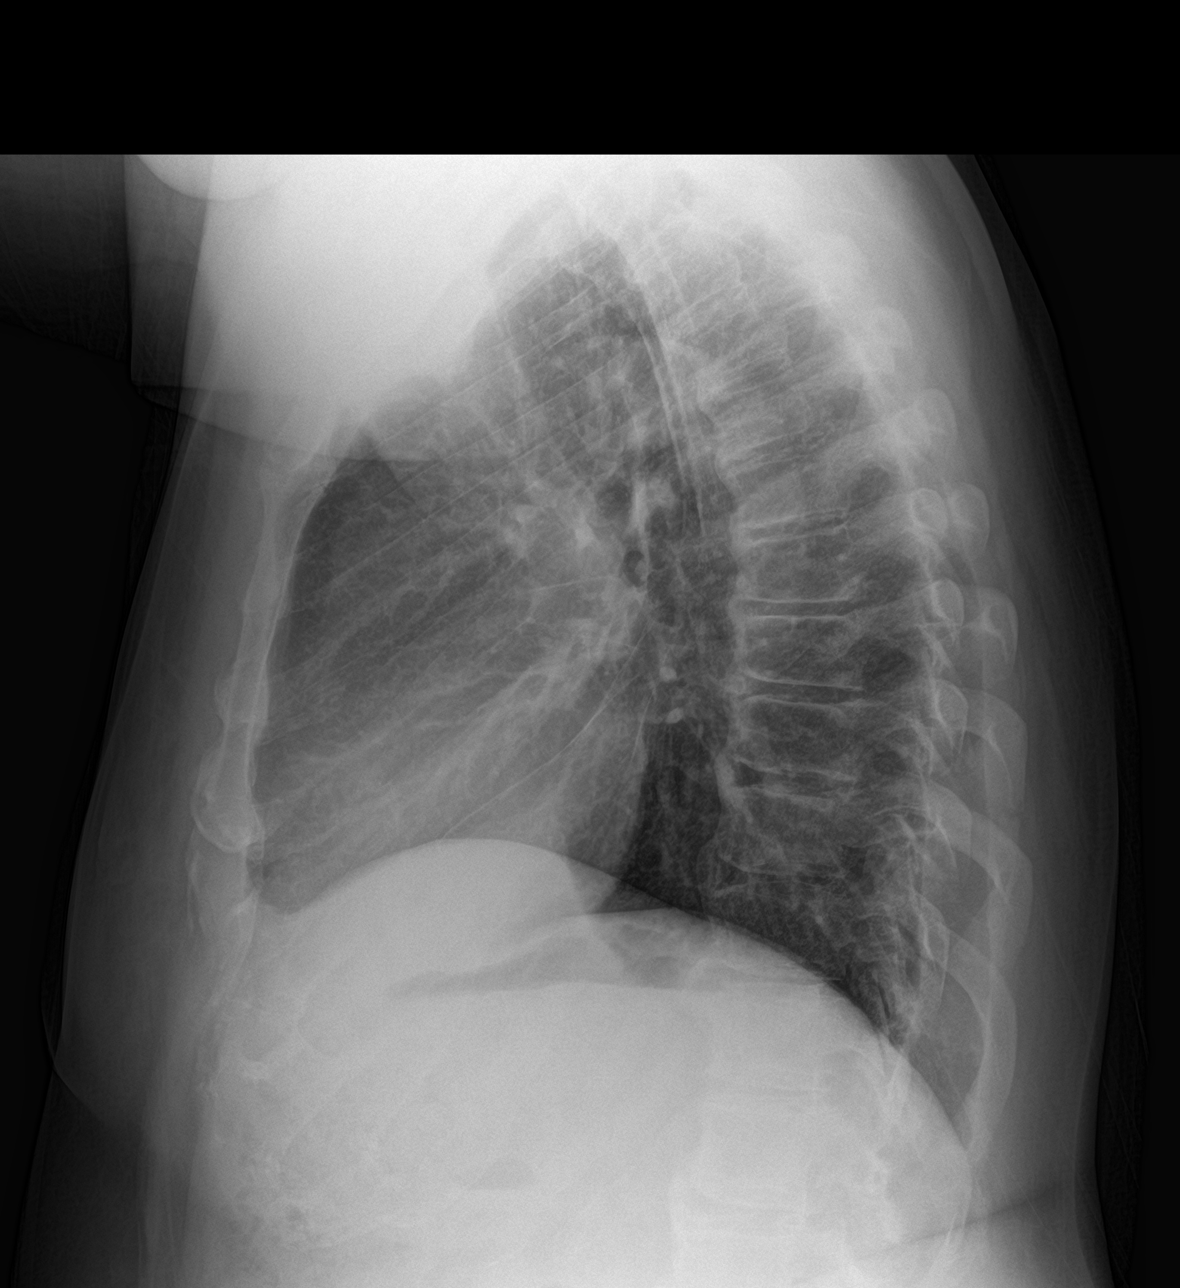

[3 of 3 positions shown; findings below may reference images not displayed]

FINDINGS: Previously single left pneumonia has resolved. Lungs clear. No
effusions. Heart is normal size. No acute bony abnormality.
IMPRESSION: No acute cardiopulmonary disease.

## 2020-01-13 LAB — HM DIABETES EYE EXAM

## 2020-01-18 ENCOUNTER — Encounter: Payer: Self-pay | Admitting: Family Medicine

## 2020-05-30 ENCOUNTER — Other Ambulatory Visit: Payer: Self-pay | Admitting: Family Medicine

## 2020-05-30 NOTE — Telephone Encounter (Signed)
Pt is scheduled for a f/u appointment for Diabetes on 06/08/2020.30 days supply refill sent to pt pharmacy

## 2020-05-31 ENCOUNTER — Other Ambulatory Visit: Payer: Self-pay | Admitting: Family Medicine

## 2020-06-08 ENCOUNTER — Other Ambulatory Visit: Payer: Self-pay

## 2020-06-08 ENCOUNTER — Encounter: Payer: Self-pay | Admitting: Family Medicine

## 2020-06-08 ENCOUNTER — Ambulatory Visit (INDEPENDENT_AMBULATORY_CARE_PROVIDER_SITE_OTHER): Payer: 59 | Admitting: Family Medicine

## 2020-06-08 VITALS — BP 138/84 | HR 95 | Temp 98.6°F | Wt 240.2 lb

## 2020-06-08 DIAGNOSIS — I10 Essential (primary) hypertension: Secondary | ICD-10-CM

## 2020-06-08 DIAGNOSIS — E119 Type 2 diabetes mellitus without complications: Secondary | ICD-10-CM | POA: Diagnosis not present

## 2020-06-08 DIAGNOSIS — N529 Male erectile dysfunction, unspecified: Secondary | ICD-10-CM

## 2020-06-08 LAB — POCT GLYCOSYLATED HEMOGLOBIN (HGB A1C): Hemoglobin A1C: 5.4 % (ref 4.0–5.6)

## 2020-06-08 MED ORDER — SILDENAFIL CITRATE 100 MG PO TABS: 100.0000 mg | ORAL_TABLET | Freq: Every day | ORAL | 6 refills | Status: DC | PRN

## 2020-06-08 MED ORDER — METFORMIN HCL ER 500 MG PO TB24: ORAL_TABLET | ORAL | 3 refills | Status: DC

## 2020-06-08 NOTE — Patient Instructions (Addendum)
You can hold Jardiance 25 mg daily.  Continue Metformin XR 1000 mg daily. Diabetes Mellitus and Nutrition, Adult When you have diabetes (diabetes mellitus), it is very important to have healthy eating habits because your blood sugar (glucose) levels are greatly affected by what you eat and drink. Eating healthy foods in the appropriate amounts, at about the same times every day, can help you:  Control your blood glucose.  Lower your risk of heart disease.  Improve your blood pressure.  Reach or maintain a healthy weight. Every person with diabetes is different, and each person has different needs for a meal plan. Your health care provider may recommend that you work with a diet and nutrition specialist (dietitian) to make a meal plan that is best for you. Your meal plan may vary depending on factors such as:  The calories you need.  The medicines you take.  Your weight.  Your blood glucose, blood pressure, and cholesterol levels.  Your activity level.  Other health conditions you have, such as heart or kidney disease. How do carbohydrates affect me? Carbohydrates, also called carbs, affect your blood glucose level more than any other type of food. Eating carbs naturally raises the amount of glucose in your blood. Carb counting is a method for keeping track of how many carbs you eat. Counting carbs is important to keep your blood glucose at a healthy level, especially if you use insulin or take certain oral diabetes medicines. It is important to know how many carbs you can safely have in each meal. This is different for every person. Your dietitian can help you calculate how many carbs you should have at each meal and for each snack. Foods that contain carbs include:  Bread, cereal, rice, pasta, and crackers.  Potatoes and corn.  Peas, beans, and lentils.  Milk and yogurt.  Fruit and juice.  Desserts, such as cakes, cookies, ice cream, and candy. How does alcohol affect  me? Alcohol can cause a sudden decrease in blood glucose (hypoglycemia), especially if you use insulin or take certain oral diabetes medicines. Hypoglycemia can be a life-threatening condition. Symptoms of hypoglycemia (sleepiness, dizziness, and confusion) are similar to symptoms of having too much alcohol. If your health care provider says that alcohol is safe for you, follow these guidelines:  Limit alcohol intake to no more than 1 drink per day for nonpregnant women and 2 drinks per day for men. One drink equals 12 oz of beer, 5 oz of wine, or 1 oz of hard liquor.  Do not drink on an empty stomach.  Keep yourself hydrated with water, diet soda, or unsweetened iced tea.  Keep in mind that regular soda, juice, and other mixers may contain a lot of sugar and must be counted as carbs. What are tips for following this plan?  Reading food labels  Start by checking the serving size on the "Nutrition Facts" label of packaged foods and drinks. The amount of calories, carbs, fats, and other nutrients listed on the label is based on one serving of the item. Many items contain more than one serving per package.  Check the total grams (g) of carbs in one serving. You can calculate the number of servings of carbs in one serving by dividing the total carbs by 15. For example, if a food has 30 g of total carbs, it would be equal to 2 servings of carbs.  Check the number of grams (g) of saturated and trans fats in one serving. Choose foods  that have low or no amount of these fats.  Check the number of milligrams (mg) of salt (sodium) in one serving. Most people should limit total sodium intake to less than 2,300 mg per day.  Always check the nutrition information of foods labeled as "low-fat" or "nonfat". These foods may be higher in added sugar or refined carbs and should be avoided.  Talk to your dietitian to identify your daily goals for nutrients listed on the label. Shopping  Avoid buying  canned, premade, or processed foods. These foods tend to be high in fat, sodium, and added sugar.  Shop around the outside edge of the grocery store. This includes fresh fruits and vegetables, bulk grains, fresh meats, and fresh dairy. Cooking  Use low-heat cooking methods, such as baking, instead of high-heat cooking methods like deep frying.  Cook using healthy oils, such as olive, canola, or sunflower oil.  Avoid cooking with butter, cream, or high-fat meats. Meal planning  Eat meals and snacks regularly, preferably at the same times every day. Avoid going long periods of time without eating.  Eat foods high in fiber, such as fresh fruits, vegetables, beans, and whole grains. Talk to your dietitian about how many servings of carbs you can eat at each meal.  Eat 4-6 ounces (oz) of lean protein each day, such as lean meat, chicken, fish, eggs, or tofu. One oz of lean protein is equal to: ? 1 oz of meat, chicken, or fish. ? 1 egg. ?  cup of tofu.  Eat some foods each day that contain healthy fats, such as avocado, nuts, seeds, and fish. Lifestyle  Check your blood glucose regularly.  Exercise regularly as told by your health care provider. This may include: ? 150 minutes of moderate-intensity or vigorous-intensity exercise each week. This could be brisk walking, biking, or water aerobics. ? Stretching and doing strength exercises, such as yoga or weightlifting, at least 2 times a week.  Take medicines as told by your health care provider.  Do not use any products that contain nicotine or tobacco, such as cigarettes and e-cigarettes. If you need help quitting, ask your health care provider.  Work with a Social worker or diabetes educator to identify strategies to manage stress and any emotional and social challenges. Questions to ask a health care provider  Do I need to meet with a diabetes educator?  Do I need to meet with a dietitian?  What number can I call if I have  questions?  When are the best times to check my blood glucose? Where to find more information:  American Diabetes Association: diabetes.org  Academy of Nutrition and Dietetics: www.eatright.[employer] of Diabetes and Digestive and Kidney Diseases (NIH): [employer] Summary  A healthy meal plan will help you control your blood glucose and maintain a healthy lifestyle.  Working with a diet and nutrition specialist (dietitian) can help you make a meal plan that is best for you.  Keep in mind that carbohydrates (carbs) and alcohol have immediate effects on your blood glucose levels. It is important to count carbs and to use alcohol carefully. This information is not intended to replace advice given to you by your health care provider. Make sure you discuss any questions you have with your health care provider. Document Revised: 06/05/2017 Document Reviewed: 07/28/2016 Elsevier Patient Education  2020 [place].  Diabetes Basics  Diabetes (diabetes mellitus) is a long-term (chronic) disease. It occurs when the body does not properly use sugar (glucose) that is  released from food after you eat. Diabetes may be caused by one or both of these problems:  Your pancreas does not make enough of a hormone called insulin.  Your body does not react in a normal way to insulin that it makes. Insulin lets sugars (glucose) go into cells in your body. This gives you energy. If you have diabetes, sugars cannot get into cells. This causes high blood sugar (hyperglycemia). Follow these instructions at home: How is diabetes treated? You may need to take insulin or other diabetes medicines daily to keep your blood sugar in balance. Take your diabetes medicines every day as told by your doctor. List your diabetes medicines here: Diabetes medicines  Name of medicine: ______________________________ ? Amount (dose): _______________ Time (a.m./p.m.): _______________ Notes:  ___________________________________  Name of medicine: ______________________________ ? Amount (dose): _______________ Time (a.m./p.m.): _______________ Notes: ___________________________________  Name of medicine: ______________________________ ? Amount (dose): _______________ Time (a.m./p.m.): _______________ Notes: ___________________________________ If you use insulin, you will learn how to give yourself insulin by injection. You may need to adjust the amount based on the food that you eat. List the types of insulin you use here: Insulin  Insulin type: ______________________________ ? Amount (dose): _______________ Time (a.m./p.m.): _______________ Notes: ___________________________________  Insulin type: ______________________________ ? Amount (dose): _______________ Time (a.m./p.m.): _______________ Notes: ___________________________________  Insulin type: ______________________________ ? Amount (dose): _______________ Time (a.m./p.m.): _______________ Notes: ___________________________________  Insulin type: ______________________________ ? Amount (dose): _______________ Time (a.m./p.m.): _______________ Notes: ___________________________________  Insulin type: ______________________________ ? Amount (dose): _______________ Time (a.m./p.m.): _______________ Notes: ___________________________________ How do I manage my blood sugar?  Check your blood sugar levels using a blood glucose monitor as directed by your doctor. Your doctor will set treatment goals for you. Generally, you should have these blood sugar levels:  Before meals (preprandial): 80-130 mg/dL (4.4-7.2 mmol/L).  After meals (postprandial): below 180 mg/dL (10 mmol/L).  A1c level: less than 7%. Write down the times that you will check your blood sugar levels: Blood sugar checks  Time: _______________ Notes: ___________________________________  Time: _______________ Notes:  ___________________________________  Time: _______________ Notes: ___________________________________  Time: _______________ Notes: ___________________________________  Time: _______________ Notes: ___________________________________  Time: _______________ Notes: ___________________________________  What do I need to know about low blood sugar? Low blood sugar is called hypoglycemia. This is when blood sugar is at or below 70 mg/dL (3.9 mmol/L). Symptoms may include:  Feeling: ? Hungry. ? Worried or nervous (anxious). ? Sweaty and clammy. ? Confused. ? Dizzy. ? Sleepy. ? Sick to your stomach (nauseous).  Having: ? A fast heartbeat. ? A headache. ? A change in your vision. ? Tingling or no feeling (numbness) around the mouth, lips, or tongue. ? Jerky movements that you cannot control (seizure).  Having trouble with: ? Moving (coordination). ? Sleeping. ? Passing out (fainting). ? Getting upset easily (irritability). Treating low blood sugar To treat low blood sugar, eat or drink something sugary right away. If you can think clearly and swallow safely, follow the 15:15 rule:  Take 15 grams of a fast-acting carb (carbohydrate). Talk with your doctor about how much you should take.  Some fast-acting carbs are: ? Sugar tablets (glucose pills). Take 3-4 glucose pills. ? 6-8 pieces of hard candy. ? 4-6 oz (120-150 mL) of fruit juice. ? 4-6 oz (120-150 mL) of regular (not diet) soda. ? 1 Tbsp (15 mL) honey or sugar.  Check your blood sugar 15 minutes after you take the carb.  If your blood sugar is still at or below 70 mg/dL (3.9  mmol/L), take 15 grams of a carb again.  If your blood sugar does not go above 70 mg/dL (3.9 mmol/L) after 3 tries, get help right away.  After your blood sugar goes back to normal, eat a meal or a snack within 1 hour. Treating very low blood sugar If your blood sugar is at or below 54 mg/dL (3 mmol/L), you have very low blood sugar (severe  hypoglycemia). This is an emergency. Do not wait to see if the symptoms will go away. Get medical help right away. Call your local emergency services (911 in the [place] Do not drive yourself to the hospital. Questions to ask your health care provider  Do I need to meet with a diabetes educator?  What equipment will I need to care for myself at home?  What diabetes medicines do I need? When should I take them?  How often do I need to check my blood sugar?  What number can I call if I have questions?  When is my next doctor's visit?  Where can I find a support group for people with diabetes? Where to find more information  American Diabetes Association: www.diabetes.org  American Association of Diabetes Educators: www.diabeteseducator.org/patient-resources Contact a doctor if:  Your blood sugar is at or above 240 mg/dL (13.3 mmol/L) for 2 days in a row.  You have been sick or have had a fever for 2 days or more, and you are not getting better.  You have any of these problems for more than 6 hours: ? You cannot eat or drink. ? You feel sick to your stomach (nauseous). ? You throw up (vomit). ? You have watery poop (diarrhea). Get help right away if:  Your blood sugar is lower than 54 mg/dL (3 mmol/L).  You get confused.  You have trouble: ? Thinking clearly. ? Breathing. Summary  Diabetes (diabetes mellitus) is a long-term (chronic) disease. It occurs when the body does not properly use sugar (glucose) that is released from food after digestion.  Take insulin and diabetes medicines as told.  Check your blood sugar every day, as often as told.  Keep all follow-up visits as told by your doctor. This is important. This information is not intended to replace advice given to you by your health care provider. Make sure you discuss any questions you have with your health care provider. Document Revised: 03/16/2019 Document Reviewed: 09/25/2017 Elsevier Patient Education   [place].

## 2020-06-08 NOTE — Progress Notes (Signed)
Subjective:    Patient ID: Robert Mcguire, male    DOB: 08-01-1960, 59 y.o.   MRN: [id number]  No chief complaint on file.   HPI Patient is a 59 year-old male with past medical history significant for HTN, ED, DM 2, seasonal allergies, HLD, history of renal calculi who was seen for follow-up.  Patient taking Metformin XR 1000 mg daily and Jardiance 25 mg daily.  Patient states he is not checking fsbs regularly.  Taking benazepril 40 mg, Toprol-XL 100 mg, and Norvasc 10 mg for HTN.  Patient denies headaches, CP, changes in vision.  Patient walking daily for exercise.  Patient planning to retire from [employer] in April and hopes to get a Physiological scientist.  Past Medical History:  Diagnosis Date  . Allergic rhinitis   . Allergy   . Diabetes mellitus without complication ([place])   . ED (erectile dysfunction)   . GERD (gastroesophageal reflux disease)   . Glaucoma   . Hyperlipidemia   . Hypertension   . Impaired glucose tolerance   . Nephrolithiasis   . Obesity     No Known Allergies  ROS General: Denies fever, chills, night sweats, changes in weight, changes in appetite HEENT: Denies headaches, ear pain, changes in vision, rhinorrhea, sore throat CV: Denies CP, palpitations, SOB, orthopnea Pulm: Denies SOB, cough, wheezing GI: Denies abdominal pain, nausea, vomiting, diarrhea, constipation GU: Denies dysuria, hematuria, frequency Msk: Denies muscle cramps, joint pains Neuro: Denies weakness, numbness, tingling Skin: Denies rashes, bruising Psych: Denies depression, anxiety, hallucinations      Objective:    Blood pressure 138/84, pulse 95, temperature 98.6 F (37 C), temperature source Oral, weight 240 lb 3.2 oz (109 kg), SpO2 99 %.  Gen. Pleasant, well-nourished, in no distress, normal affect   HEENT: [place]/AT, face symmetric, conjunctiva clear, no scleral icterus, PERRLA, EOMI, nares patent without drainage Lungs: no accessory muscle use, CTAB, no wheezes or rales Cardiovascular:  RRR, no m/r/g, no peripheral edema Musculoskeletal: No deformities, no cyanosis or clubbing, normal tone Neuro:  A&Ox3, CN II-XII intact, normal gait Skin:  Warm, no lesions/ rash  Wt Readings from Last 3 Encounters:  06/08/20 240 lb 3.2 oz (109 kg)  07/27/19 237 lb (107.5 kg)  03/10/19 238 lb 1.6 oz (108 kg)    Lab Results  Component Value Date   WBC 4.5 07/27/2019   HGB 16.6 07/27/2019   HCT 48.1 07/27/2019   PLT 178.0 07/27/2019   GLUCOSE 93 07/27/2019   CHOL 125 07/27/2019   TRIG 84.0 07/27/2019   HDL 41.80 07/27/2019   LDLDIRECT 168.0 05/13/2016   LDLCALC 67 07/27/2019   ALT 25 03/10/2019   AST 77 (H) 03/10/2019   NA 138 07/27/2019   K 4.1 07/27/2019   CL 102 07/27/2019   CREATININE 0.68 07/27/2019   BUN 15 07/27/2019   CO2 28 07/27/2019   TSH 1.01 06/21/2018   PSA 1.25ng/ml 06/12/2017   HGBA1C 5.2 07/27/2019   MICROALBUR 44.3 (H) 07/27/2019    Assessment/Plan:  Type 2 diabetes mellitus without complication, without long-term current use of insulin (HCC)  -Hemoglobin A1c 5.2% 07/27/2019 -Hemoglobin A1c 5.4% this visit. -Discussed holding Jardiance 25 mg daily.  Patient hesitant. -Continue Metformin XR 1000 mg daily - Plan: POC Glucose (CBG), metFORMIN (GLUCOPHAGE-XR) 500 MG 24 hr tablet  Essential hypertension -Elevated -Recheck -Continue current medications including Norvasc 10 mg, benazepril 40 mg, Toprol-XL 100 mg daily -Continue lifestyle modifications -Patient encouraged to check BP at home and keep a log  to bring with him to clinic  Erectile dysfunction, unspecified erectile dysfunction type  - Plan: sildenafil (VIAGRA) 100 MG tablet  F/u in 1 month for CPE  Grier Mitts, MD

## 2020-07-14 ENCOUNTER — Other Ambulatory Visit: Payer: Self-pay | Admitting: Family Medicine

## 2020-07-14 DIAGNOSIS — I10 Essential (primary) hypertension: Secondary | ICD-10-CM

## 2020-08-07 ENCOUNTER — Other Ambulatory Visit: Payer: 59

## 2020-08-24 ENCOUNTER — Other Ambulatory Visit: Payer: Self-pay | Admitting: Family Medicine

## 2020-09-04 ENCOUNTER — Other Ambulatory Visit: Payer: Self-pay | Admitting: Family Medicine

## 2020-09-20 ENCOUNTER — Other Ambulatory Visit: Payer: Self-pay

## 2020-09-20 ENCOUNTER — Ambulatory Visit (INDEPENDENT_AMBULATORY_CARE_PROVIDER_SITE_OTHER): Payer: 59 | Admitting: Family Medicine

## 2020-09-20 ENCOUNTER — Encounter: Payer: Self-pay | Admitting: Family Medicine

## 2020-09-20 VITALS — BP 130/80 | HR 64 | Temp 98.5°F | Ht 67.75 in | Wt 252.6 lb

## 2020-09-20 DIAGNOSIS — N62 Hypertrophy of breast: Secondary | ICD-10-CM

## 2020-09-20 DIAGNOSIS — R350 Frequency of micturition: Secondary | ICD-10-CM | POA: Diagnosis not present

## 2020-09-20 DIAGNOSIS — E119 Type 2 diabetes mellitus without complications: Secondary | ICD-10-CM

## 2020-09-20 DIAGNOSIS — Z23 Encounter for immunization: Secondary | ICD-10-CM | POA: Diagnosis not present

## 2020-09-20 DIAGNOSIS — N401 Enlarged prostate with lower urinary tract symptoms: Secondary | ICD-10-CM | POA: Diagnosis not present

## 2020-09-20 DIAGNOSIS — N529 Male erectile dysfunction, unspecified: Secondary | ICD-10-CM

## 2020-09-20 DIAGNOSIS — Z0000 Encounter for general adult medical examination without abnormal findings: Secondary | ICD-10-CM | POA: Diagnosis not present

## 2020-09-20 DIAGNOSIS — Z1159 Encounter for screening for other viral diseases: Secondary | ICD-10-CM

## 2020-09-20 DIAGNOSIS — I10 Essential (primary) hypertension: Secondary | ICD-10-CM

## 2020-09-20 LAB — CBC WITH DIFFERENTIAL/PLATELET
Basophils Absolute: 0 10*3/uL (ref 0.0–0.1)
Basophils Relative: 0.3 % (ref 0.0–3.0)
Eosinophils Absolute: 0 10*3/uL (ref 0.0–0.7)
Eosinophils Relative: 0.7 % (ref 0.0–5.0)
HCT: 41.5 % (ref 39.0–52.0)
Hemoglobin: 14.9 g/dL (ref 13.0–17.0)
Lymphocytes Relative: 31 % (ref 12.0–46.0)
Lymphs Abs: 1.7 10*3/uL (ref 0.7–4.0)
MCHC: 35.7 g/dL (ref 30.0–36.0)
MCV: 88.5 fl (ref 78.0–100.0)
Monocytes Absolute: 0.4 10*3/uL (ref 0.1–1.0)
Monocytes Relative: 8.1 % (ref 3.0–12.0)
Neutro Abs: 3.3 10*3/uL (ref 1.4–7.7)
Neutrophils Relative %: 59.9 % (ref 43.0–77.0)
Platelets: 183 10*3/uL (ref 150.0–400.0)
RBC: 4.7 Mil/uL (ref 4.22–5.81)
RDW: 14.5 % (ref 11.5–15.5)
WBC: 5.4 10*3/uL (ref 4.0–10.5)

## 2020-09-20 LAB — VITAMIN B12: Vitamin B-12: >1506 pg/mL — ABNORMAL HIGH (ref 211–911)

## 2020-09-20 LAB — LIPID PANEL
Cholesterol: 117 mg/dL (ref 0–200)
HDL: 38.7 mg/dL — ABNORMAL LOW (ref >39.00)
LDL Cholesterol: 61 mg/dL (ref 0–99)
NonHDL: 78.37
Total CHOL/HDL Ratio: 3
Triglycerides: 88 mg/dL (ref 0.0–149.0)
VLDL: 17.6 mg/dL (ref 0.0–40.0)

## 2020-09-20 LAB — PSA: PSA: 1.56 ng/mL (ref 0.10–4.00)

## 2020-09-20 LAB — BASIC METABOLIC PANEL
BUN: 15 mg/dL (ref 6–23)
CO2: 27 mEq/L (ref 19–32)
Calcium: 10 mg/dL (ref 8.4–10.5)
Chloride: 104 mEq/L (ref 96–112)
Creatinine, Ser: 0.74 mg/dL (ref 0.40–1.50)
GFR: 98.48 mL/min (ref >60.00)
Glucose, Bld: 121 mg/dL — ABNORMAL HIGH (ref 70–99)
Potassium: 4.2 mEq/L (ref 3.5–5.1)
Sodium: 141 mEq/L (ref 135–145)

## 2020-09-20 LAB — TSH: TSH: 1.08 u[IU]/mL (ref 0.35–4.50)

## 2020-09-20 LAB — HEMOGLOBIN A1C: Hgb A1c MFr Bld: 5.4 % (ref 4.6–6.5)

## 2020-09-20 LAB — MICROALBUMIN / CREATININE URINE RATIO
Creatinine,U: 224 mg/dL
Microalb Creat Ratio: 23.7 mg/g (ref 0.0–30.0)
Microalb, Ur: 53.1 mg/dL — ABNORMAL HIGH (ref 0.0–1.9)

## 2020-09-20 LAB — VITAMIN D 25 HYDROXY (VIT D DEFICIENCY, FRACTURES): VITD: 33.7 ng/mL (ref 30.00–100.00)

## 2020-09-20 LAB — T4, FREE: Free T4: 1 ng/dL (ref 0.60–1.60)

## 2020-09-20 NOTE — Patient Instructions (Signed)
Preventive Care 40-61 Years Old, Male Preventive care refers to lifestyle choices and visits with your health care provider that can promote health and wellness. This includes:  A yearly physical exam. This is also called an annual wellness visit.  Regular dental and eye exams.  Immunizations.  Screening for certain conditions.  Healthy lifestyle choices, such as: ? Eating a healthy diet. ? Getting regular exercise. ? Not using drugs or products that contain nicotine and tobacco. ? Limiting alcohol use. What can I expect for my preventive care visit? Physical exam Your health care provider will check your:  Height and weight. These may be used to calculate your BMI (body mass index). BMI is a measurement that tells if you are at a healthy weight.  Heart rate and blood pressure.  Body temperature.  Skin for abnormal spots. Counseling Your health care provider may ask you questions about your:  Past medical problems.  Family's medical history.  Alcohol, tobacco, and drug use.  Emotional well-being.  Home life and relationship well-being.  Sexual activity.  Diet, exercise, and sleep habits.  Work and work environment.  Access to firearms. What immunizations do I need? Vaccines are usually given at various ages, according to a schedule. Your health care provider will recommend vaccines for you based on your age, medical history, and lifestyle or other factors, such as travel or where you work.   What tests do I need? Blood tests  Lipid and cholesterol levels. These may be checked every 5 years, or more often if you are over 50 years old.  Hepatitis C test.  Hepatitis B test. Screening  Lung cancer screening. You may have this screening every year starting at age 55 if you have a 30-pack-year history of smoking and currently smoke or have quit within the past 15 years.  Prostate cancer screening. Recommendations will vary depending on your family history and  other risks.  Genital exam to check for testicular cancer or hernias.  Colorectal cancer screening. ? All adults should have this screening starting at age 50 and continuing until age 75. ? Your health care provider may recommend screening at age 45 if you are at increased risk. ? You will have tests every 1-10 years, depending on your results and the type of screening test.  Diabetes screening. ? This is done by checking your blood sugar (glucose) after you have not eaten for a while (fasting). ? You may have this done every 1-3 years.  STD (sexually transmitted disease) testing, if you are at risk. Follow these instructions at home: Eating and drinking  Eat a diet that includes fresh fruits and vegetables, whole grains, lean protein, and low-fat dairy products.  Take vitamin and mineral supplements as recommended by your health care provider.  Do not drink alcohol if your health care provider tells you not to drink.  If you drink alcohol: ? Limit how much you have to 0-2 drinks a day. ? Be aware of how much alcohol is in your drink. In the [place] one drink equals one 12 oz bottle of beer (355 mL), one 5 oz glass of wine (148 mL), or one 1 oz glass of hard liquor (44 mL).   Lifestyle  Take daily care of your teeth and gums. Brush your teeth every morning and night with fluoride toothpaste. Floss one time each day.  Stay active. Exercise for at least 30 minutes 5 or more days each week.  Do not use any products that contain nicotine or   tobacco, such as cigarettes, e-cigarettes, and chewing tobacco. If you need help quitting, ask your health care provider.  Do not use drugs.  If you are sexually active, practice safe sex. Use a condom or other form of protection to prevent STIs (sexually transmitted infections).  If told by your health care provider, take low-dose aspirin daily starting at age 54.  Find healthy ways to cope with stress, such as: ? Meditation, yoga, or  listening to music. ? Journaling. ? Talking to a trusted person. ? Spending time with friends and family. Safety  Always wear your seat belt while driving or riding in a vehicle.  Do not drive: ? If you have been drinking alcohol. Do not ride with someone who has been drinking. ? When you are tired or distracted. ? While texting.  Wear a helmet and other protective equipment during sports activities.  If you have firearms in your house, make sure you follow all gun safety procedures. What's next?  Go to your health care provider once a year for an annual wellness visit.  Ask your health care provider how often you should have your eyes and teeth checked.  Stay up to date on all vaccines. This information is not intended to replace advice given to you by your health care provider. Make sure you discuss any questions you have with your health care provider. Document Revised: 03/22/2019 Document Reviewed: 06/17/2018 Elsevier Patient Education  2021 [employer].  Immunization Schedule, 77-55 Years Old  Vaccines are usually given at various ages, according to a schedule. Your health care provider will recommend vaccines for you based on your age, medical history, and lifestyle or other factors such as travel or where you work. You may receive vaccines as individual doses or as more than one vaccine together in one shot (combination vaccines). Talk with your health care provider about the risks and benefits of combination vaccines. Recommended immunizations for 81-48 years old Influenza vaccine  You should get a dose of the influenza vaccine every year. Tetanus, diphtheria, and pertussis vaccine A vaccine that protects against tetanus, diphtheria, and pertussis is known as the Tdap vaccine. A vaccine that protects against tetanus and diphtheria is known as the Td vaccine.  You should only get the Td vaccine if you have had at least 1 dose of the Tdap vaccine.  You should get 1  dose of the Td or Tdap vaccine every 10 years, or you should get 1 dose of the Tdap vaccine if: ? You have not previously gotten a Tdap vaccine. ? You do not know if you have ever gotten a Tdap vaccine. Zoster vaccine This is also known as the RZV vaccine. You should get 2 doses of the RZV vaccine 2 to 6 months apart. It is important to get the RZV vaccine even if you:  Have had shingles.  Have received the ZVL vaccine, an older version of the RZV vaccine.  Are unsure if you have had chickenpox (varicella). Pneumococcal conjugate vaccine This is also known as the PCV13 vaccine. You should get the PCV13 vaccine as recommended if you have certain high-risk conditions. These include:  Diabetes.  Chronic conditions of the heart, lungs, or liver.  Conditions that affect the body's disease-fighting system (immune system). Pneumococcal polysaccharide vaccine This is also known as the PPSV23 vaccine. You should get the PPSV23 vaccine as recommended if you have certain high-risk conditions. These include:  Diabetes.  Chronic conditions of the heart, lungs, or liver.  Conditions that  affect the immune system. Hepatitis A vaccine This is also known as the HepA vaccine. If you did not get the HepA vaccine previously, you should get it if:  You are at risk for a hepatitis A infection. You may be at risk for infection if you: ? Have chronic liver disease. ? Have HIV or AIDS. ? Are a man who has sex with men. ? Use drugs. ? Are homeless. ? May be exposed to hepatitis A through work. ? Travel to countries where hepatitis A is common. ? Have or will have close contact with someone who was adopted from another country.  You are not at risk for infection but want protection from hepatitis A. Hepatitis B vaccine This is also known as the HepB vaccine. If you did not get the HepB vaccine previously, you should get it if:  You are at risk for hepatitis B infection. You are at risk if  you: ? Have chronic liver disease. ? Have HIV or AIDS. ? Have sex with a partner who has hepatitis B, or:  You have multiple sex partners.  You are a man who has sex with men. ? Use drugs. ? May be exposed to hepatitis B through work. ? Live with someone who has hepatitis B. ? Receive dialysis treatment. ? Have diabetes. ? Travel to countries where hepatitis B is common.  You are not at risk of infection but want protection from hepatitis B. Measles, mumps, and rubella vaccine This is also known as the MMR vaccine. You may need to get the MMR vaccine if you were born in 1957 or later and:  You need to catch up on doses you missed in the past.  You have not been given the vaccine before.  You do not have evidence of immunity (by a blood test). Varicella vaccine This is also known as the VAR vaccine. You may need to get the VAR vaccine if you do not have evidence of immunity (by a blood test) and you may be exposed to varicella through work. This is especially important if you work in a health care setting. Meningococcal conjugate vaccine This is also known as the MenACWY vaccine. You may need to get the MenACWY vaccine if you:  Have not been given the vaccine before.  Need to catch up on doses you missed in the past. This vaccine is especially important if you:  Do not have a spleen.  Have sickle cell disease.  Have HIV.  Take medicines that suppress your immune system.  Travel to countries where meningococcal disease is common.  Are exposed to Neisseria meningitidis at work. Serogroup B meningococcal vaccine This is also known as the MenB vaccine. You may need to get the MenB vaccine if you:  Have not been given the vaccine before.  Need to catch up on doses you missed in the past. This vaccine is especially important if you:  Do not have a spleen.  Have sickle cell disease.  Take medicines that suppress your immune system.  Are exposed to Neisseria  meningitidis at work. Haemophilus influenzae type b vaccine This is also known as the Hib vaccine. Anyone older than 61 years of age is usually not given the Hib vaccine. However, if you have certain high-risk conditions, you may need to get this vaccine. These conditions include:  Not having a spleen.  Having received a stem cell transplant. Before you get a vaccine: Talk with your health care provider about which vaccines are right for  you. This is especially important if:  You previously had a reaction after getting a vaccine.  You have a weakened immune system. You may have a weakened immune system if you: ? Are taking medicines that reduce (suppress) the activity of your immune system. ? Are taking medicines to treat cancer (chemotherapy). ? Have HIV or AIDS.  You work in an environment where you may be exposed to a disease.  You plan to travel outside of the country.  You have a chronic illness, such as heart disease, kidney disease, diabetes, or lung disease. Summary  Before you get a vaccine, tell your health care provider if you have reacted to vaccines in the past or have a condition that weakens your immune system.  At 50-64 years, you should get a dose of the flu vaccine every year and a dose of the Td or Tdap vaccine every 10 years.  You should get 2 doses of the RZV vaccine 2 to 6 months apart.  Depending on your medical history and your risk factors, you may need other vaccines. Ask your health care provider whether you are up to date on all your vaccines. This information is not intended to replace advice given to you by your health care provider. Make sure you discuss any questions you have with your health care provider. Document Revised: 04/19/2019 Document Reviewed: 04/19/2019 Elsevier Patient Education  [place].

## 2020-09-20 NOTE — Addendum Note (Signed)
Addended by: Anderson Malta on: 09/20/2020 10:02 AM   Modules accepted: Orders

## 2020-09-20 NOTE — Progress Notes (Signed)
Subjective:     Robert Mcguire is a 60 y.o. male and is here for a comprehensive physical exam. The patient reports no problems.  Pt will be retiring in 2 wks.  [facility] [employer] [facility]  Patient eating less greasy fried foods.  Having salmon at least once a week, salad, fruits and vegetables.  Patient notes feeling better, less lethargic, since stopping Jardiance several months ago.  Inquires about A1c.  Taking Metformin XR 1000 mg daily last hemoglobin A1c was 5.4% on 06/08/2020.  Followed by urology for BPH, ED. last PSA 11/21.  Patient recently started Alfuzosin.  Notes less frequent urination, up 1 time at night.  BP at home typically 130s-140s/80s.  Taking metoprolol XL 100 mg, benazepril 40 mg and Norvasc 10 mg.  Denies LE edema.  Colonoscopy done 12/23/2018.  Diabetic retinopathy screen negative on 01/13/2020.  Patient completed 2 doses of COVID-19 and a booster.  Has not had shingles or pneumonia vaccine.  Social History   Socioeconomic History  . Marital status: Married    Spouse name: Not on file  . Number of children: Not on file  . Years of education: Not on file  . Highest education level: Not on file  Occupational History  . Not on file  Tobacco Use  . Smoking status: Never Smoker  . Smokeless tobacco: Never Used  Substance and Sexual Activity  . Alcohol use: Yes    Alcohol/week: 0.0 standard drinks    Comment: occasionally  . Drug use: No  . Sexual activity: Not on file  Other Topics Concern  . Not on file  Social History Narrative  . Not on file   Social Determinants of Health   Financial Resource Strain: Not on file  Food Insecurity: Not on file  Transportation Needs: Not on file  Physical Activity: Not on file  Stress: Not on file  Social Connections: Not on file  Intimate Partner Violence: Not on file   Health Maintenance  Topic Date Due  . Hepatitis C Screening  Never done  . PNEUMOCOCCAL POLYSACCHARIDE VACCINE AGE 32-64 HIGH RISK   Never done  . HIV Screening  Never done  . FOOT EXAM  06/22/2019  . COVID-19 Vaccine (4 - Booster for Moderna series) 10/26/2020  . HEMOGLOBIN A1C  12/07/2020  . OPHTHALMOLOGY EXAM  01/12/2021  . COLONOSCOPY (Pts 45-37yrs Insurance coverage will need to be confirmed)  12/23/2023  . TETANUS/TDAP  03/21/2025  . INFLUENZA VACCINE  Completed  . HPV VACCINES  Aged Out    The following portions of the patient's history were reviewed and updated as appropriate: allergies, current medications, past family history, past medical history, past social history, past surgical history and problem list.  Review of Systems Pertinent items noted in HPI and remainder of comprehensive ROS otherwise negative.   Objective:    BP 130/80 (BP Location: Right Arm, Patient Position: Sitting, Cuff Size: Large)   Pulse 64   Temp 98.5 F (36.9 C) (Oral)   Ht 5' 7.75" (1.721 m)   Wt 252 lb 9.6 oz (114.6 kg)   SpO2 99%   BMI 38.69 kg/m  General appearance: alert, cooperative and no distress Head: Normocephalic, without obvious abnormality, atraumatic Eyes: conjunctivae/corneas clear. PERRL, EOM's intact. Fundi benign. Ears: normal TM's and external ear canals both ears Nose: Nares normal. Septum midline. Mucosa normal. No drainage or sinus tenderness. Throat: lips, mucosa, and tongue normal; teeth and gums normal Neck: no adenopathy,  no carotid bruit, no JVD, supple, symmetrical, trachea midline and thyroid not enlarged, symmetric, no tenderness/mass/nodules Lungs: clear to auscultation bilaterally Heart: regular rate and rhythm, S1, S2 normal, no murmur, click, rub or gallop Abdomen: soft, non-tender; bowel sounds normal; no masses,  no organomegaly Extremities: extremities normal, atraumatic, no cyanosis or edema Pulses: 2+ and symmetric Skin: Skin color, texture, turgor normal. No rashes or lesions Lymph nodes: Cervical, supraclavicular, and axillary nodes normal. Neurologic: Alert and oriented X 3,  normal strength and tone. Normal symmetric reflexes. Normal coordination and gait    Assessment:    Healthy male exam.      Plan:     Anticipatory guidance given including wearing seatbelts, smoke detectors in the home, increasing physical activity, increasing p.o. intake of water and vegetables. -We will obtain labs -Colonoscopy up-to-date done 12/23/2018 repeat due in 10 years 2030 -Discussed obtaining shingles vaccine.  Immunizations reviewed -Given handout -Next CPE in 1 year See After Visit Summary for Counseling Recommendations   Essential hypertension Elevated at 130/80 -Discussed lifestyle modifications -We will continue current medications including Norvasc 10 mg, benazepril 40 mg, Toprol-XL 100 mg daily -Continue to monitor BP at home - Plan: Basic metabolic panel  Type 2 diabetes mellitus without complication, without long-term current use of insulin (HCC) -Hemoglobin A1c 5.4% on 06/08/2020 -Jardiance 25 mg daily discontinued in December 2021 -Continue lifestyle modifications -Continue Metformin XR 1000 mg daily - Plan: Hemoglobin A1c, Lipid panel, Microalbumin/Creatinine Ratio, Urine, vitamin B12  Gynecomastia -Stable -Status post work-up and evaluation by Endo - Plan: TSH, T4, Free, PSA, Testosterone Total,Free,Bio, Males  Erectile dysfunction, unspecified erectile dysfunction type -Continue sildenafil -Continue follow-up with urology  Benign prostatic hyperplasia with urinary frequency -Continue alfuzosin -Continue follow-up with urology - Plan: CBC with Differential/Platelet, PSA  Encounter for hepatitis C screening test for low risk patient - Plan: Hep C Antibody  Need for pneumococcal vaccine -PCV 20 given this visit  Follow-up as needed in 4-6 months for HTN, DM  Grier Mitts, MD

## 2020-09-21 LAB — TESTOSTERONE TOTAL,FREE,BIO, MALES
Albumin: 4.3 g/dL (ref 3.6–5.1)
Sex Hormone Binding: 41 nmol/L (ref 22–77)
Testosterone, Bioavailable: 60.5 ng/dL — ABNORMAL LOW (ref >=110.0)
Testosterone, Free: 30.7 pg/mL — ABNORMAL LOW (ref 46.0–224.0)
Testosterone: 288 ng/dL (ref 250–827)

## 2020-09-21 LAB — HEPATITIS C ANTIBODY
Hepatitis C Ab: NONREACTIVE
SIGNAL TO CUT-OFF: 0.02 (ref <1.00)

## 2020-09-25 NOTE — Progress Notes (Signed)
Spoke with patient and is aware.

## 2020-10-09 ENCOUNTER — Other Ambulatory Visit: Payer: Self-pay | Admitting: Family Medicine

## 2020-10-09 DIAGNOSIS — I10 Essential (primary) hypertension: Secondary | ICD-10-CM

## 2020-11-20 ENCOUNTER — Other Ambulatory Visit: Payer: Self-pay | Admitting: Family Medicine

## 2020-12-08 ENCOUNTER — Other Ambulatory Visit: Payer: Self-pay | Admitting: Family Medicine

## 2021-01-26 ENCOUNTER — Other Ambulatory Visit: Payer: Self-pay | Admitting: Family Medicine

## 2021-01-28 LAB — HM DIABETES EYE EXAM

## 2021-01-31 ENCOUNTER — Encounter: Payer: Self-pay | Admitting: Family Medicine

## 2021-02-01 MED ORDER — EMPAGLIFLOZIN 25 MG PO TABS: 25.0000 mg | ORAL_TABLET | Freq: Every day | ORAL | 0 refills | Status: DC

## 2021-02-05 ENCOUNTER — Encounter: Payer: Self-pay | Admitting: Family Medicine

## 2021-03-15 ENCOUNTER — Ambulatory Visit (INDEPENDENT_AMBULATORY_CARE_PROVIDER_SITE_OTHER): Payer: 59 | Admitting: Family Medicine

## 2021-03-15 ENCOUNTER — Other Ambulatory Visit: Payer: Self-pay

## 2021-03-15 ENCOUNTER — Encounter: Payer: Self-pay | Admitting: Family Medicine

## 2021-03-15 VITALS — BP 124/80 | HR 86 | Temp 98.4°F | Wt 244.0 lb

## 2021-03-15 DIAGNOSIS — E119 Type 2 diabetes mellitus without complications: Secondary | ICD-10-CM | POA: Diagnosis not present

## 2021-03-15 DIAGNOSIS — S90221A Contusion of right lesser toe(s) with damage to nail, initial encounter: Secondary | ICD-10-CM | POA: Diagnosis not present

## 2021-03-15 DIAGNOSIS — Z23 Encounter for immunization: Secondary | ICD-10-CM | POA: Diagnosis not present

## 2021-03-15 LAB — POCT GLYCOSYLATED HEMOGLOBIN (HGB A1C): Hemoglobin A1C: 5.5 % (ref 4.0–5.6)

## 2021-03-15 NOTE — Progress Notes (Signed)
Subjective:    Patient ID: Robert Mcguire, male    DOB: 02/13/1961, 60 y.o.   MRN: [id number]  Chief Complaint  Patient presents with   Toe Injury    Rt second toe is injured and toenail is darkening. Hit it on something about 6 weeks ago. Has some slight numbness.    HPI Patient was seen today for ongoing concern.  Patient endorses stubbing R 2nd toe on a piece of furniture 6 weeks ago. Patient notes the toenail has turned black and has some numbness in the toe. Patient has not taken anything for his symptoms.  Denies deformity or pain of the foot or toe.  Currently taking Jardiance for DM. Pt requesting hemoglobin A1c be checked.  Hemoglobin A1c 5.4 on 09/20/2020.  Past Medical History:  Diagnosis Date   Allergic rhinitis    Allergy    Diabetes mellitus without complication (HCC)    ED (erectile dysfunction)    GERD (gastroesophageal reflux disease)    Glaucoma    Hyperlipidemia    Hypertension    Impaired glucose tolerance    Nephrolithiasis    Obesity     No Known Allergies  ROS General: Denies fever, chills, night sweats, changes in weight, changes in appetite HEENT: Denies headaches, ear pain, changes in vision, rhinorrhea, sore throat CV: Denies CP, palpitations, SOB, orthopnea Pulm: Denies SOB, cough, wheezing GI: Denies abdominal pain, nausea, vomiting, diarrhea, constipation GU: Denies dysuria, hematuria, frequency Msk: Denies muscle cramps, joint pains + toe pain Neuro: Denies weakness, numbness, tingling + toe numbness Skin: Denies rashes, bruising Psych: Denies depression, anxiety, hallucinations  Objective:    Blood pressure 124/80, pulse 86, temperature 98.4 F (36.9 C), temperature source Oral, weight 244 lb (110.7 kg), SpO2 95 %.  Gen. Pleasant, well-nourished, in no distress, normal affect   HEENT: [place]/AT, face symmetric, conjunctiva clear, no scleral icterus, PERRLA, EOMI, nares patent without drainage Lungs: no accessory muscle use Cardiovascular: RRR,  no peripheral edema.  2+ PT and DP pulses. Musculoskeletal: No TTP of right foot or toe.  Large dark area of proximal right second toenail and a small dark area of dried appearing blood at medial distal corner of right third toenail.  No deformities, no cyanosis or clubbing, normal tone Neuro:  A&Ox3, CN II-XII intact, normal gait.  Sensation to pinprick and vibratory sense intact. Skin:  Warm, dry, intact.  Wt Readings from Last 3 Encounters:  03/15/21 244 lb (110.7 kg)  09/20/20 252 lb 9.6 oz (114.6 kg)  06/08/20 240 lb 3.2 oz (109 kg)    Lab Results  Component Value Date   WBC 5.4 09/20/2020   HGB 14.9 09/20/2020   HCT 41.5 09/20/2020   PLT 183.0 09/20/2020   GLUCOSE 121 (H) 09/20/2020   CHOL 117 09/20/2020   TRIG 88.0 09/20/2020   HDL 38.70 (L) 09/20/2020   LDLDIRECT 168.0 05/13/2016   LDLCALC 61 09/20/2020   ALT 25 03/10/2019   AST 77 (H) 03/10/2019   NA 141 09/20/2020   K 4.2 09/20/2020   CL 104 09/20/2020   CREATININE 0.74 09/20/2020   BUN 15 09/20/2020   CO2 27 09/20/2020   TSH 1.08 09/20/2020   PSA 1.56 09/20/2020   HGBA1C 5.4 09/20/2020   MICROALBUR 53.1 (H) 09/20/2020    Assessment/Plan:  Subungual hematoma of toe of right foot, initial encounter -Self-limited -Given handout -Continue to monitor  Need for influenza vaccination - Plan: Flu Vaccine QUAD 6+ mos PF IM (Fluarix Quad PF)  Type  2 diabetes mellitus without complication, without long-term current use of insulin (HCC)  -Controlled -Hemoglobin A1c 5.4% on 09/20/2020 -Continue current medications including Jardiance 25 mg and lifestyle modifications -Consider decreasing Jardiance given A1c -Foot exam done - Plan: POCT glycosylated hemoglobin (Hb A1C)  F/u prn  Grier Mitts, MD

## 2021-04-06 ENCOUNTER — Other Ambulatory Visit: Payer: Self-pay | Admitting: Family Medicine

## 2021-04-11 ENCOUNTER — Other Ambulatory Visit: Payer: Self-pay | Admitting: Family Medicine

## 2021-04-11 DIAGNOSIS — I10 Essential (primary) hypertension: Secondary | ICD-10-CM

## 2021-05-01 ENCOUNTER — Other Ambulatory Visit: Payer: Self-pay | Admitting: Family Medicine

## 2021-06-18 ENCOUNTER — Other Ambulatory Visit: Payer: Self-pay | Admitting: Family Medicine

## 2021-07-04 ENCOUNTER — Other Ambulatory Visit: Payer: Self-pay | Admitting: Family Medicine

## 2021-07-04 DIAGNOSIS — E119 Type 2 diabetes mellitus without complications: Secondary | ICD-10-CM

## 2021-07-30 ENCOUNTER — Other Ambulatory Visit: Payer: Self-pay | Admitting: Family Medicine

## 2021-08-16 ENCOUNTER — Other Ambulatory Visit: Payer: Self-pay | Admitting: Family Medicine

## 2021-08-16 DIAGNOSIS — I10 Essential (primary) hypertension: Secondary | ICD-10-CM

## 2021-09-16 ENCOUNTER — Other Ambulatory Visit: Payer: Self-pay | Admitting: Family Medicine

## 2021-09-30 ENCOUNTER — Ambulatory Visit (INDEPENDENT_AMBULATORY_CARE_PROVIDER_SITE_OTHER): Payer: 59 | Admitting: Family Medicine

## 2021-09-30 ENCOUNTER — Encounter: Payer: Self-pay | Admitting: Family Medicine

## 2021-09-30 VITALS — BP 138/79 | HR 67 | Temp 98.7°F | Ht 68.0 in | Wt 243.8 lb

## 2021-09-30 DIAGNOSIS — E782 Mixed hyperlipidemia: Secondary | ICD-10-CM

## 2021-09-30 DIAGNOSIS — L210 Seborrhea capitis: Secondary | ICD-10-CM | POA: Diagnosis not present

## 2021-09-30 DIAGNOSIS — I10 Essential (primary) hypertension: Secondary | ICD-10-CM | POA: Diagnosis not present

## 2021-09-30 DIAGNOSIS — Z125 Encounter for screening for malignant neoplasm of prostate: Secondary | ICD-10-CM

## 2021-09-30 DIAGNOSIS — E1169 Type 2 diabetes mellitus with other specified complication: Secondary | ICD-10-CM | POA: Diagnosis not present

## 2021-09-30 DIAGNOSIS — Z0000 Encounter for general adult medical examination without abnormal findings: Secondary | ICD-10-CM | POA: Diagnosis not present

## 2021-09-30 DIAGNOSIS — Z6837 Body mass index (BMI) 37.0-37.9, adult: Secondary | ICD-10-CM

## 2021-09-30 LAB — CBC WITH DIFFERENTIAL/PLATELET
Basophils Absolute: 0 10*3/uL (ref 0.0–0.1)
Basophils Relative: 0.3 % (ref 0.0–3.0)
Eosinophils Absolute: 0 10*3/uL (ref 0.0–0.7)
Eosinophils Relative: 0.8 % (ref 0.0–5.0)
HCT: 48.3 % (ref 39.0–52.0)
Hemoglobin: 16.3 g/dL (ref 13.0–17.0)
Lymphocytes Relative: 37.3 % (ref 12.0–46.0)
Lymphs Abs: 1.5 10*3/uL (ref 0.7–4.0)
MCHC: 33.7 g/dL (ref 30.0–36.0)
MCV: 90.3 fl (ref 78.0–100.0)
Monocytes Absolute: 0.3 10*3/uL (ref 0.1–1.0)
Monocytes Relative: 8.4 % (ref 3.0–12.0)
Neutro Abs: 2.2 10*3/uL (ref 1.4–7.7)
Neutrophils Relative %: 53.2 % (ref 43.0–77.0)
Platelets: 162 10*3/uL (ref 150.0–400.0)
RBC: 5.35 Mil/uL (ref 4.22–5.81)
RDW: 14.1 % (ref 11.5–15.5)
WBC: 4.1 10*3/uL (ref 4.0–10.5)

## 2021-09-30 LAB — COMPREHENSIVE METABOLIC PANEL
ALT: 22 U/L (ref 0–53)
AST: 19 U/L (ref 0–37)
Albumin: 4.5 g/dL (ref 3.5–5.2)
Alkaline Phosphatase: 63 U/L (ref 39–117)
BUN: 16 mg/dL (ref 6–23)
CO2: 27 mEq/L (ref 19–32)
Calcium: 10 mg/dL (ref 8.4–10.5)
Chloride: 104 mEq/L (ref 96–112)
Creatinine, Ser: 0.84 mg/dL (ref 0.40–1.50)
GFR: 94.1 mL/min (ref >60.00)
Glucose, Bld: 116 mg/dL — ABNORMAL HIGH (ref 70–99)
Potassium: 4.3 mEq/L (ref 3.5–5.1)
Sodium: 138 mEq/L (ref 135–145)
Total Bilirubin: 1.8 mg/dL — ABNORMAL HIGH (ref 0.2–1.2)
Total Protein: 7.3 g/dL (ref 6.0–8.3)

## 2021-09-30 LAB — LIPID PANEL
Cholesterol: 125 mg/dL (ref 0–200)
HDL: 41.7 mg/dL (ref >39.00)
LDL Cholesterol: 67 mg/dL (ref 0–99)
NonHDL: 83.71
Total CHOL/HDL Ratio: 3
Triglycerides: 86 mg/dL (ref 0.0–149.0)
VLDL: 17.2 mg/dL (ref 0.0–40.0)

## 2021-09-30 LAB — HEMOGLOBIN A1C: Hgb A1c MFr Bld: 5.6 % (ref 4.6–6.5)

## 2021-09-30 MED ORDER — ATORVASTATIN CALCIUM 40 MG PO TABS: 40.0000 mg | ORAL_TABLET | Freq: Every day | ORAL | 3 refills | Status: DC

## 2021-09-30 MED ORDER — BENAZEPRIL HCL 40 MG PO TABS: 40.0000 mg | ORAL_TABLET | Freq: Every day | ORAL | 3 refills | Status: DC

## 2021-09-30 MED ORDER — AMLODIPINE BESYLATE 10 MG PO TABS: 10.0000 mg | ORAL_TABLET | Freq: Every day | ORAL | 0 refills | Status: DC

## 2021-09-30 MED ORDER — EMPAGLIFLOZIN 25 MG PO TABS: 25.0000 mg | ORAL_TABLET | Freq: Every day | ORAL | 3 refills | Status: DC

## 2021-09-30 MED ORDER — METOPROLOL SUCCINATE ER 100 MG PO TB24: ORAL_TABLET | ORAL | 3 refills | Status: DC

## 2021-09-30 NOTE — Progress Notes (Signed)
Subjective:  ?  ? Robert Mcguire is a 61 y.o. male and is here for a comprehensive physical exam. The patient reports doing well.  Pt interested in weight management referral.  Pt states bp at home stable, was 133/79 this am.  BS also good at home.  Pt requesting refills on some meds.  Inquires about lipitor being a high dose.  Denies myalgias.  Pt notes seeing dandruff in scalp.  OTC selsun blue shampoo helps.  Currently washing hair every 2 days.  Does not use any other products in hair.  Seen by [facility].  Colonoscopy done 12/23/2018, but exam 03/15/2021, eye exam 01/28/2021. ? ?Social History  ? ?Socioeconomic History  ? Marital status: Married  ?  Spouse name: Not on file  ? Number of children: Not on file  ? Years of education: Not on file  ? Highest education level: Not on file  ?Occupational History  ? Not on file  ?Tobacco Use  ? Smoking status: Never  ? Smokeless tobacco: Never  ?Substance and Sexual Activity  ? Alcohol use: Yes  ?  Alcohol/week: 0.0 standard drinks  ?  Comment: occasionally  ? Drug use: No  ? Sexual activity: Not on file  ?Other Topics Concern  ? Not on file  ?Social History Narrative  ? Not on file  ? ?Social Determinants of Health  ? ?Financial Resource Strain: Not on file  ?Food Insecurity: Not on file  ?Transportation Needs: Not on file  ?Physical Activity: Not on file  ?Stress: Not on file  ?Social Connections: Not on file  ?Intimate Partner Violence: Not on file  ? ?Health Maintenance  ?Topic Date Due  ? HIV Screening  Never done  ? Zoster Vaccines- Shingrix (1 of 2) Never done  ? COVID-19 Vaccine (5 - Booster for Moderna series) 12/17/2020  ? HEMOGLOBIN A1C  09/12/2021  ? OPHTHALMOLOGY EXAM  01/28/2022  ? FOOT EXAM  03/15/2022  ? COLONOSCOPY (Pts 45-66yr Insurance coverage will need to be confirmed)  12/23/2023  ? TETANUS/TDAP  03/21/2025  ? INFLUENZA VACCINE  Completed  ? Hepatitis C Screening  Completed  ? HPV VACCINES  Aged Out  ? ? ?The following portions of the patient's  history were reviewed and updated as appropriate: allergies, current medications, past family history, past medical history, past social history, past surgical history, and problem list. ? ?Review of Systems ?Pertinent items noted in HPI and remainder of comprehensive ROS otherwise negative.  ? ?Objective:  ? ? BP 138/79 (BP Location: Right Arm, Patient Position: Sitting, Cuff Size: Large)   Pulse 67   Temp 98.7 ?F (37.1 ?C) (Oral)   Ht '5\' 8"'$  (1.727 m)   Wt 243 lb 12.8 oz (110.6 kg)   SpO2 100%   BMI 37.07 kg/m?  ?General appearance: alert, cooperative, and no distress ?Head: Normocephalic, without obvious abnormality, atraumatic ?Eyes: conjunctivae/corneas clear. PERRL, EOM's intact. Fundi benign. ?Ears: normal TM's and external ear canals both ears ?Nose: Nares normal. Septum midline. Mucosa normal. No drainage or sinus tenderness. ?Throat: lips, mucosa, and tongue normal; teeth and gums normal ?Neck: no adenopathy, no carotid bruit, no JVD, supple, symmetrical, trachea midline, and thyroid not enlarged, symmetric, no tenderness/mass/nodules ?Lungs: clear to auscultation bilaterally ?Heart: regular rate and rhythm, S1, S2 normal, no murmur, click, rub or gallop ?Extremities: extremities normal, atraumatic, no cyanosis or edema ?Pulses: 2+ and symmetric ?Skin: Skin color, texture, turgor normal. No rashes or lesions no dandruff present. ?Lymph nodes: Cervical, supraclavicular, and axillary  nodes normal. ?Neurologic: Alert and oriented X 3, normal strength and tone. Normal symmetric reflexes. Normal coordination and gait  ?  ?Assessment:  ? ? Healthy male exam with concerns   ?  ?Plan:  ? ? Anticipatory guidance given including wearing seatbelts, smoke detectors in the home, increasing physical activity, increasing p.o. intake of water and vegetables. ?-labs ?-Colonoscopy up-to-date.  Done 12/23/2018.  Repeat in 10 years ?-Immunizations reviewed.  Patient to obtain shingles vaccine at local pharmacy. ?-Given  handout ?-Next EPM 1 year ?See After Visit Summary for Counseling Recommendations  ? ?Class 2 severe obesity due to excess calories with serious comorbidity and body mass index (BMI) of 37.0 to 37.9 in adult [facility]) ?-Body mass index is 37.07 kg/m?. ?-Continue lifestyle modifications ?- Plan: Amb Ref to Medical Weight Management ? ?Type 2 diabetes mellitus with other specified complication, without long-term current use of insulin ([place])" ?-hbg A1C 5.5% on 03/15/21 ?-Continue lifestyle modifications  ?-Continue current medications including Jardiance 25 mg daily, metformin XR 1000 mg daily ?-Eye exam done 01/28/2021 without retinopathy ?-Foot exam done 03/15/2021 ?-Continue statin and ACE I ?- Plan: CBC with Differential/Platelet, Hemoglobin A1c, Lipid panel, empagliflozin (JARDIANCE) 25 MG TABS tablet ? ?Essential hypertension ?-Controlled ?-Continue current medications including benazepril 40 mg daily, Toprol-XL 100 mg daily ?-Continue lifestyle modifications ?- Plan: CBC with Differential/Platelet, TSH, T4, Free, Lipid panel, CMP, amLODipine (NORVASC) 10 MG tablet, benazepril (LOTENSIN) 40 MG tablet, metoprolol succinate (TOPROL-XL) 100 MG 24 hr tablet ? ?Dandruff ?-Advised okay to use OTC Selsun Blue as needed ?-Patient advised to space out shampooing hair to twice a week ?-Also discussed using a product to provide moisture to hair and scalp such as a light oil ?- Plan: CBC with Differential/Platelet, CMP, Vitamin B12 ? ?Prostate cancer screening  ?-PSA 1.3 on 01/30/2021 ?-Continue follow-up with [facility] ?- Plan: PSA ? ?Mixed hyperlipidemia ?-Continue Lipitor 40 mg daily ?-Continue lifestyle modifications ?- Plan: Lipid panel, atorvastatin (LIPITOR) 40 MG tablet ? ?F/u in 6 months prn ? ?Grier Mitts, MD ?

## 2021-10-01 LAB — VITAMIN B12: Vitamin B-12: 1287 pg/mL — ABNORMAL HIGH (ref 211–911)

## 2021-10-01 LAB — T4, FREE: Free T4: 0.89 ng/dL (ref 0.60–1.60)

## 2021-10-01 LAB — TSH: TSH: 1.19 u[IU]/mL (ref 0.35–5.50)

## 2021-10-01 LAB — PSA: PSA: 1.58 ng/mL (ref 0.10–4.00)

## 2022-01-23 ENCOUNTER — Ambulatory Visit (INDEPENDENT_AMBULATORY_CARE_PROVIDER_SITE_OTHER): Payer: 59 | Admitting: Podiatry

## 2022-01-23 ENCOUNTER — Ambulatory Visit (INDEPENDENT_AMBULATORY_CARE_PROVIDER_SITE_OTHER): Payer: 59

## 2022-01-23 ENCOUNTER — Encounter: Payer: Self-pay | Admitting: Podiatry

## 2022-01-23 DIAGNOSIS — M722 Plantar fascial fibromatosis: Secondary | ICD-10-CM

## 2022-01-23 DIAGNOSIS — M7662 Achilles tendinitis, left leg: Secondary | ICD-10-CM

## 2022-01-23 MED ORDER — METHYLPREDNISOLONE 4 MG PO TBPK: ORAL_TABLET | ORAL | 0 refills | Status: DC

## 2022-01-23 MED ORDER — DEXAMETHASONE SODIUM PHOSPHATE 120 MG/30ML IJ SOLN
2.0000 mg | Freq: Once | INTRAMUSCULAR | Status: AC
  Administered 2022-01-23: 2 mg via INTRA_ARTICULAR

## 2022-01-23 MED ORDER — MOBIC 15 MG PO TABS: 15.0000 mg | ORAL_TABLET | Freq: Every day | ORAL | 3 refills | Status: DC

## 2022-01-23 NOTE — Progress Notes (Signed)
Subjective:  Patient ID: Robert Mcguire, male    DOB: 04/11/1961,  MRN: [id number] HPI Chief Complaint  Patient presents with   Foot Pain    Posterior heel left - aching x couple months, no injury, history bone spur with surgery in right, tried Ibuprofen   New Patient (Initial Visit)    61 y.o. male presents with the above complaint.   ROS: Denies fever chills nausea vomiting muscle aches pains calf pain back pain chest pain shortness of breath.  Past Medical History:  Diagnosis Date   Allergic rhinitis    Allergy    Diabetes mellitus without complication ([place])    ED (erectile dysfunction)    GERD (gastroesophageal reflux disease)    Glaucoma    Hyperlipidemia    Hypertension    Impaired glucose tolerance    Nephrolithiasis    Obesity    Past Surgical History:  Procedure Laterality Date   COLONOSCOPY     difficultity with intabation     GYNECOMASTIA EXCISION Bilateral    POLYPECTOMY     right achilles tendon surgery      Current Outpatient Medications:    methylPREDNISolone (MEDROL DOSEPAK) 4 MG TBPK tablet, 6 day dose pack - take as directed, Disp: 21 tablet, Rfl: 0   MOBIC 15 MG tablet, Take 1 tablet (15 mg total) by mouth daily., Disp: 30 tablet, Rfl: 3   alfuzosin (UROXATRAL) 10 MG 24 hr tablet, Take 10 mg by mouth daily., Disp: , Rfl:    amLODipine (NORVASC) 10 MG tablet, Take 1 tablet (10 mg total) by mouth daily., Disp: 90 tablet, Rfl: 0   atorvastatin (LIPITOR) 40 MG tablet, Take 1 tablet (40 mg total) by mouth daily., Disp: 90 tablet, Rfl: 3   benazepril (LOTENSIN) 40 MG tablet, Take 1 tablet (40 mg total) by mouth daily., Disp: 90 tablet, Rfl: 3   empagliflozin (JARDIANCE) 25 MG TABS tablet, Take 1 tablet (25 mg total) by mouth daily., Disp: 90 tablet, Rfl: 3   lansoprazole (PREVACID) 30 MG capsule, Take 1 capsule (30 mg total) by mouth daily., Disp: 90 capsule, Rfl: 1   latanoprost (XALATAN) 0.005 % ophthalmic solution, , Disp: , Rfl:    metFORMIN  (GLUCOPHAGE-XR) 500 MG 24 hr tablet, TAKE 2 TABLETS BY MOUTH EVERY DAY WITH BREAKFAST, Disp: 180 tablet, Rfl: 3   metoprolol succinate (TOPROL-XL) 100 MG 24 hr tablet, Take with or immediately following a meal., Disp: 90 tablet, Rfl: 3   sildenafil (VIAGRA) 100 MG tablet, Take 1 tablet (100 mg total) by mouth daily as needed for erectile dysfunction., Disp: 30 tablet, Rfl: 6   timolol (TIMOPTIC) 0.5 % ophthalmic solution, 1 drop daily., Disp: , Rfl:   No Known Allergies Review of Systems Objective:  There were no vitals filed for this visit.  General: Well developed, nourished, in no acute distress, alert and oriented x3   Dermatological: Skin is warm, dry and supple bilateral. Nails x 10 are well maintained; remaining integument appears unremarkable at this time. There are no open sores, no preulcerative lesions, no rash or signs of infection present.  Vascular: Dorsalis Pedis artery and Posterior Tibial artery pedal pulses are 2/4 bilateral with immedate capillary fill time. Pedal hair growth present. No varicosities and no lower extremity edema present bilateral.   Neruologic: Grossly intact via light touch bilateral. Vibratory intact via tuning fork bilateral. Protective threshold with Semmes Wienstein monofilament intact to all pedal sites bilateral. Patellar and Achilles deep tendon reflexes 2+ bilateral. No Babinski  or clonus noted bilateral.   Musculoskeletal: No gross boney pedal deformities bilateral. No pain, crepitus, or limitation noted with foot and ankle range of motion bilateral. Muscular strength 5/5 in all groups tested bilateral.  Moderate to severe pain on palpation with mild warmth and erythema of the Achilles tendon as it inserts on the posterior aspect of the calcaneus and moves laterally.  Right foot demonstrates a previous retrocalcaneal heel spur resection and Achilles tenolysis.  Gait: Unassisted, Nonantalgic.    Radiographs:  Radiographs taken today demonstrate  possible DISH syndrome of his left foot with considerable spurring around the talonavicular joint the navicular posterior tibial insertion also the peroneal brevis tendon insertion plantarly plantar fascial insertion and posteriorly with the Achilles tendon insertion the Achilles tendon is thickened but does appear to be intact.  Assessment & Plan:   Assessment: Large bone spur posterior aspect of the calcaneus with insertional Achilles tendinitis and bursitis.  Plan: Discussed etiology pathology conservative surgical therapies at this point we will place him in a cam boot inject the posterior aspect of the heel with 2 mg of dexamethasone local anesthetic not injecting into the tendon itself.  Started him on methylprednisolone to be followed by mobic.  We will follow-up with him in 1 month if not improved considerably MRI will be necessary.     Marquesha Robideau T. Moore, [place]

## 2022-01-23 NOTE — Patient Instructions (Signed)

## 2022-01-27 DIAGNOSIS — Z0289 Encounter for other administrative examinations: Secondary | ICD-10-CM

## 2022-01-28 ENCOUNTER — Ambulatory Visit (INDEPENDENT_AMBULATORY_CARE_PROVIDER_SITE_OTHER): Payer: 59 | Admitting: Bariatrics

## 2022-01-28 ENCOUNTER — Encounter (INDEPENDENT_AMBULATORY_CARE_PROVIDER_SITE_OTHER): Payer: Self-pay | Admitting: Bariatrics

## 2022-01-28 VITALS — BP 139/87 | Ht 67.0 in | Wt 232.0 lb

## 2022-01-28 DIAGNOSIS — Z7984 Long term (current) use of oral hypoglycemic drugs: Secondary | ICD-10-CM

## 2022-01-28 DIAGNOSIS — E119 Type 2 diabetes mellitus without complications: Secondary | ICD-10-CM | POA: Insufficient documentation

## 2022-01-28 DIAGNOSIS — R0602 Shortness of breath: Secondary | ICD-10-CM | POA: Insufficient documentation

## 2022-01-28 DIAGNOSIS — E782 Mixed hyperlipidemia: Secondary | ICD-10-CM | POA: Diagnosis not present

## 2022-01-28 DIAGNOSIS — Z1331 Encounter for screening for depression: Secondary | ICD-10-CM | POA: Diagnosis not present

## 2022-01-28 DIAGNOSIS — I10 Essential (primary) hypertension: Secondary | ICD-10-CM

## 2022-01-28 DIAGNOSIS — E1169 Type 2 diabetes mellitus with other specified complication: Secondary | ICD-10-CM

## 2022-01-28 DIAGNOSIS — E559 Vitamin D deficiency, unspecified: Secondary | ICD-10-CM | POA: Diagnosis not present

## 2022-01-28 DIAGNOSIS — Z0000 Encounter for general adult medical examination without abnormal findings: Secondary | ICD-10-CM

## 2022-01-28 DIAGNOSIS — R5383 Other fatigue: Secondary | ICD-10-CM | POA: Diagnosis not present

## 2022-01-28 DIAGNOSIS — Z6836 Body mass index (BMI) 36.0-36.9, adult: Secondary | ICD-10-CM

## 2022-01-30 ENCOUNTER — Encounter (INDEPENDENT_AMBULATORY_CARE_PROVIDER_SITE_OTHER): Payer: Self-pay | Admitting: Bariatrics

## 2022-01-30 DIAGNOSIS — R748 Abnormal levels of other serum enzymes: Secondary | ICD-10-CM | POA: Insufficient documentation

## 2022-01-30 LAB — LIPID PANEL WITH LDL/HDL RATIO
Cholesterol, Total: 127 mg/dL (ref 100–199)
HDL: 44 mg/dL (ref >39)
LDL Chol Calc (NIH): 70 mg/dL (ref 0–99)
LDL/HDL Ratio: 1.6 ratio (ref 0.0–3.6)
Triglycerides: 63 mg/dL (ref 0–149)
VLDL Cholesterol Cal: 13 mg/dL (ref 5–40)

## 2022-01-30 LAB — COMPREHENSIVE METABOLIC PANEL
ALT: 60 IU/L — ABNORMAL HIGH (ref 0–44)
AST: 47 IU/L — ABNORMAL HIGH (ref 0–40)
Albumin/Globulin Ratio: 1.5 (ref 1.2–2.2)
Albumin: 4.2 g/dL (ref 3.9–4.9)
Alkaline Phosphatase: 65 IU/L (ref 44–121)
BUN/Creatinine Ratio: 23 (ref 10–24)
BUN: 19 mg/dL (ref 8–27)
Bilirubin Total: 1.3 mg/dL — ABNORMAL HIGH (ref 0.0–1.2)
CO2: 22 mmol/L (ref 20–29)
Calcium: 10.2 mg/dL (ref 8.6–10.2)
Chloride: 99 mmol/L (ref 96–106)
Creatinine, Ser: 0.81 mg/dL (ref 0.76–1.27)
Globulin, Total: 2.8 g/dL (ref 1.5–4.5)
Glucose: 86 mg/dL (ref 70–99)
Potassium: 4.4 mmol/L (ref 3.5–5.2)
Sodium: 138 mmol/L (ref 134–144)
Total Protein: 7 g/dL (ref 6.0–8.5)
eGFR: 100 mL/min/{1.73_m2} (ref >59)

## 2022-01-30 LAB — TSH+T4F+T3FREE
Free T4: 1.4 ng/dL (ref 0.82–1.77)
T3, Free: 3 pg/mL (ref 2.0–4.4)
TSH: 1.81 u[IU]/mL (ref 0.450–4.500)

## 2022-01-30 LAB — INSULIN, RANDOM: INSULIN: 10.3 u[IU]/mL (ref 2.6–24.9)

## 2022-01-30 LAB — HEMOGLOBIN A1C
Est. average glucose Bld gHb Est-mCnc: 105 mg/dL
Hgb A1c MFr Bld: 5.3 % (ref 4.8–5.6)

## 2022-01-30 LAB — VITAMIN D 25 HYDROXY (VIT D DEFICIENCY, FRACTURES): Vit D, 25-Hydroxy: 35.6 ng/mL (ref 30.0–100.0)

## 2022-02-11 ENCOUNTER — Encounter (INDEPENDENT_AMBULATORY_CARE_PROVIDER_SITE_OTHER): Payer: Self-pay | Admitting: Bariatrics

## 2022-02-11 ENCOUNTER — Ambulatory Visit (INDEPENDENT_AMBULATORY_CARE_PROVIDER_SITE_OTHER): Payer: 59 | Admitting: Bariatrics

## 2022-02-11 VITALS — BP 136/72 | HR 66 | Temp 98.1°F | Ht 67.0 in | Wt 225.0 lb

## 2022-02-11 DIAGNOSIS — R748 Abnormal levels of other serum enzymes: Secondary | ICD-10-CM | POA: Diagnosis not present

## 2022-02-11 DIAGNOSIS — E669 Obesity, unspecified: Secondary | ICD-10-CM | POA: Diagnosis not present

## 2022-02-11 DIAGNOSIS — I10 Essential (primary) hypertension: Secondary | ICD-10-CM

## 2022-02-11 DIAGNOSIS — Z6835 Body mass index (BMI) 35.0-35.9, adult: Secondary | ICD-10-CM

## 2022-02-11 NOTE — Progress Notes (Unsigned)
Chief Complaint:   OBESITY Robert Mcguire (MR# [id number]) is a 61 y.o. male who presents for evaluation and treatment of obesity and related comorbidities. Current BMI is Body mass index is 36.34 kg/m. Robert Mcguire has been struggling with his weight for many years and has been unsuccessful in either losing weight, maintaining weight loss, or reaching his healthy weight goal.  Robert Mcguire does not like to cook and notes obstacles as not planning meals.  He craves ice cream.  He is down about 50 pounds from his previous weight.  Robert Mcguire is currently in the action stage of change and ready to dedicate time achieving and maintaining a healthier weight. Robert Mcguire is interested in becoming our patient and working on intensive lifestyle modifications including (but not limited to) diet and exercise for weight loss.  Robert Mcguire's habits were reviewed today and are as follows: His family eats meals together, he thinks his family will eat healthier with him, his desired weight loss is 17 lbs, he has been heavy most of his life, he started gaining weight after his first year of college, his heaviest weight ever was 289 pounds, he has significant food cravings issues, he snacks frequently in the evenings, he is frequently drinking liquids with calories, he frequently eats larger portions than normal, and he struggles with emotional eating.  Depression Screen Robert Mcguire's Food and Mood (modified PHQ-9) score was 3.     01/28/2022    9:05 AM  Depression screen PHQ 2/9  Decreased Interest 1  Down, Depressed, Hopeless 0  PHQ - 2 Score 1  Altered sleeping 0  Tired, decreased energy 1  Change in appetite 1  Feeling bad or failure about yourself  0  Trouble concentrating 0  Moving slowly or fidgety/restless 0  Suicidal thoughts 0  PHQ-9 Score 3  Difficult doing work/chores Not difficult at all   Subjective:   1. Other fatigue Robert Mcguire admits to daytime somnolence and denies waking up still tired. Patient has a history of symptoms of  daytime fatigue. Robert Mcguire generally gets 5 or 6 hours of sleep per night, and states that he has generally restful sleep. Apneic episodes are not present. Epworth Sleepiness Score is 12.   2. SOB (shortness of breath) on exertion Robert Mcguire notes increasing shortness of breath with exercising and seems to be worsening over time with weight gain. He notes getting out of breath sooner with activity than he used to. This has not gotten worse recently. Robert Mcguire denies shortness of breath at rest or orthopnea.  3. Type 2 diabetes mellitus with other specified complication, without long-term current use of insulin ([facility]) Robert Mcguire is taking Jardiance, and his diabetes is controlled.  4. Essential hypertension Robert Mcguire is taking Lotensin, and his blood pressure is controlled.  5. Mixed hyperlipidemia Robert Mcguire is taking atorvastatin currently.  6. Health care maintenance Obesity with comorbidity.  7. Vitamin D deficiency Robert Mcguire is currently taking vitamin D, elderberry, and multivitamins.  Assessment/Plan:   1. Other fatigue Robert Mcguire does feel that his weight is causing his energy to be lower than it should be. Fatigue may be related to obesity, depression or many other causes. Labs will be ordered, and in the meanwhile, Robert Mcguire will focus on self care including making healthy food choices, increasing physical activity and focusing on stress reduction.  - EKG 12-Lead - TSH+T4F+T3Free  2. SOB (shortness of breath) on exertion Robert Mcguire does feel that he gets out of breath more easily that he used to when he exercises. Robert Mcguire's shortness  of breath appears to be obesity related and exercise induced. He has agreed to work on weight loss and gradually increase exercise to treat his exercise induced shortness of breath. Will continue to monitor closely.  - TSH+T4F+T3Free  3. Type 2 diabetes mellitus with other specified complication, without long-term current use of insulin ([facility]) We will check labs today, and we will follow-up at Robert Mcguire's next  visit.  - Hemoglobin A1c - Insulin, random  4. Essential hypertension We will check labs today, and Robert Mcguire will continue his medications as directed.  We will follow-up at his next visit.  - Comprehensive metabolic panel - Lipid Panel With LDL/HDL Ratio  5. Mixed hyperlipidemia We will check labs today, and Robert Mcguire will continue his medications as directed.  We will follow-up at his next visit.  - Comprehensive metabolic panel - Lipid Panel With LDL/HDL Ratio  6. Health care maintenance EKG and IC were done today, and we will check labs today.  We will follow-up at Robert Mcguire's next visit.  7. Vitamin D deficiency We will check labs today, and we will follow-up at Robert Mcguire's next visit.  - VITAMIN D 25 Hydroxy (Vit-D Deficiency, Fractures)  8. Depression screening Robert Mcguire had a negative depression screening. Depression is commonly associated with obesity and often results in emotional eating behaviors. We will monitor this closely and work on CBT to help improve the non-hunger eating patterns. Referral to Psychology may be required if no improvement is seen as he continues in our clinic.  9. Class 2 severe obesity with serious comorbidity and body mass index (BMI) of 36.0 to 36.9 in adult, unspecified obesity type Robert Mcguire) Robert Mcguire is currently in the action stage of change and his goal is to continue with weight loss efforts. I recommend Robert Mcguire begin the structured treatment plan as follows:  He has agreed to the Category 3 Plan.  Meal planning was discussed.  Reviewed labs with the patient from 09/30/2021, CMP, lipids, B12, CBC, A1c, glucose, and thyroid panel.  Exercise goals: Walking 2-3 daily, and joined the fitness center.   Behavioral modification strategies: increasing lean protein intake, decreasing simple carbohydrates, increasing vegetables, increasing water intake, decreasing eating out, no skipping meals, meal planning and cooking strategies, keeping healthy foods in the home, and planning for  success.  He was informed of the importance of frequent follow-up visits to maximize his success with intensive lifestyle modifications for his multiple health conditions. He was informed we would discuss his lab results at his next visit unless there is a critical issue that needs to be addressed sooner. Robert Mcguire agreed to keep his next visit at the agreed upon time to discuss these results.  Objective:   Height '5\' 7"'$  (1.702 m), weight 232 lb (105.2 kg). Body mass index is 36.34 kg/m.  EKG: Normal sinus rhythm, rate 56 BPM.  Indirect Calorimeter completed today shows a VO2 of 325 and a REE of 2246.  His calculated basal metabolic rate is 8502 thus his basal metabolic rate is better than expected.  General: Cooperative, alert, well developed, in no acute distress. HEENT: Conjunctivae and lids unremarkable. Cardiovascular: Regular rhythm.  Lungs: Normal work of breathing. Neurologic: No focal deficits.   Lab Results  Component Value Date   CREATININE 0.81 01/29/2022   BUN 19 01/29/2022   NA 138 01/29/2022   K 4.4 01/29/2022   CL 99 01/29/2022   CO2 22 01/29/2022   Lab Results  Component Value Date   ALT 60 (H) 01/29/2022   AST 47 (H)  01/29/2022   ALKPHOS 65 01/29/2022   BILITOT 1.3 (H) 01/29/2022   Lab Results  Component Value Date   HGBA1C 5.3 01/29/2022   HGBA1C 5.6 09/30/2021   HGBA1C 5.5 03/15/2021   HGBA1C 5.4 09/20/2020   HGBA1C 5.4 06/08/2020   Lab Results  Component Value Date   INSULIN 10.3 01/29/2022   Lab Results  Component Value Date   TSH 1.810 01/29/2022   Lab Results  Component Value Date   CHOL 127 01/29/2022   HDL 44 01/29/2022   LDLCALC 70 01/29/2022   LDLDIRECT 168.0 05/13/2016   TRIG 63 01/29/2022   CHOLHDL 3 09/30/2021   Lab Results  Component Value Date   WBC 4.1 09/30/2021   HGB 16.3 09/30/2021   HCT 48.3 09/30/2021   MCV 90.3 09/30/2021   PLT 162.0 09/30/2021   No results found for: "IRON", "TIBC", "FERRITIN"  Attestation  Statements:   Reviewed by clinician on day of visit: allergies, medications, problem list, medical history, surgical history, family history, social history, and previous encounter notes.  Wilhemena Durie, am acting as Location manager for [employer], DO.  I have reviewed the above documentation for accuracy and completeness, and I agree with the above. Jearld Lesch, DO

## 2022-02-12 ENCOUNTER — Encounter (INDEPENDENT_AMBULATORY_CARE_PROVIDER_SITE_OTHER): Payer: Self-pay | Admitting: Bariatrics

## 2022-02-12 ENCOUNTER — Encounter (INDEPENDENT_AMBULATORY_CARE_PROVIDER_SITE_OTHER): Payer: Self-pay

## 2022-02-18 NOTE — Progress Notes (Unsigned)
     Chief Complaint:   OBESITY Dave is here to discuss his progress with his obesity treatment plan along with follow-up of his obesity related diagnoses. Saunders is on {MWMwtlossportion/plan2:23431} and states he is following his eating plan approximately ***% of the time. Jay states he is *** *** minutes *** times per week.  Today's visit was #: *** Starting weight: *** Starting date: *** Today's weight: *** Today's date: 02/11/2022 Total lbs lost to date: *** Total lbs lost since last in-office visit: ***  Interim History: ***  Subjective:   1. Elevated liver enzymes ***  2. Essential hypertension ***   Assessment/Plan:   1. Elevated liver enzymes ***  2. Essential hypertension ***  3. Obesity, Current BMI 35.3 [place] {CHL AMB IS/IS NOT:210130109} currently in the action stage of change. As such, his goal is to {MWMwtloss#1:210800005}. He has agreed to {MWMwtlossportion/plan2:23431}.   Exercise goals: {MWM EXERCISE RECS:23473}  Behavioral modification strategies: {MWMwtlossdietstrategies3:23432}.  Romar has agreed to follow-up with our clinic in {NUMBER 1-10:22536} weeks. He was informed of the importance of frequent follow-up visits to maximize his success with intensive lifestyle modifications for his multiple health conditions.   Objective:   Blood pressure 136/72, pulse 66, temperature 98.1 F (36.7 C), height '5\' 7"'$  (1.702 m), weight 225 lb (102.1 kg), SpO2 98 %. Body mass index is 35.24 kg/m.  General: Cooperative, alert, well developed, in no acute distress. HEENT: Conjunctivae and lids unremarkable. Cardiovascular: Regular rhythm.  Lungs: Normal work of breathing. Neurologic: No focal deficits.   Lab Results  Component Value Date   CREATININE 0.81 01/29/2022   BUN 19 01/29/2022   NA 138 01/29/2022   K 4.4 01/29/2022   CL 99 01/29/2022   CO2 22 01/29/2022   Lab Results  Component Value Date   ALT 60 (H) 01/29/2022   AST 47 (H) 01/29/2022   ALKPHOS  65 01/29/2022   BILITOT 1.3 (H) 01/29/2022   Lab Results  Component Value Date   HGBA1C 5.3 01/29/2022   HGBA1C 5.6 09/30/2021   HGBA1C 5.5 03/15/2021   HGBA1C 5.4 09/20/2020   HGBA1C 5.4 06/08/2020   Lab Results  Component Value Date   INSULIN 10.3 01/29/2022   Lab Results  Component Value Date   TSH 1.810 01/29/2022   Lab Results  Component Value Date   CHOL 127 01/29/2022   HDL 44 01/29/2022   LDLCALC 70 01/29/2022   LDLDIRECT 168.0 05/13/2016   TRIG 63 01/29/2022   CHOLHDL 3 09/30/2021   Lab Results  Component Value Date   VD25OH 35.6 01/29/2022   VD25OH 33.70 09/20/2020   Lab Results  Component Value Date   WBC 4.1 09/30/2021   HGB 16.3 09/30/2021   HCT 48.3 09/30/2021   MCV 90.3 09/30/2021   PLT 162.0 09/30/2021   No results found for: "IRON", "TIBC", "FERRITIN"  Attestation Statements:   Reviewed by clinician on day of visit: allergies, medications, problem list, medical history, surgical history, family history, social history, and previous encounter notes.   Wilhemena Durie, am acting as Location manager for [employer], DO.  I have reviewed the above documentation for accuracy and completeness, and I agree with the above. -  ***

## 2022-02-19 ENCOUNTER — Other Ambulatory Visit: Payer: Self-pay | Admitting: Family Medicine

## 2022-02-19 DIAGNOSIS — I10 Essential (primary) hypertension: Secondary | ICD-10-CM

## 2022-02-20 ENCOUNTER — Encounter (INDEPENDENT_AMBULATORY_CARE_PROVIDER_SITE_OTHER): Payer: Self-pay | Admitting: Bariatrics

## 2022-03-03 ENCOUNTER — Ambulatory Visit (INDEPENDENT_AMBULATORY_CARE_PROVIDER_SITE_OTHER): Payer: 59 | Admitting: Podiatry

## 2022-03-03 ENCOUNTER — Encounter: Payer: Self-pay | Admitting: Podiatry

## 2022-03-03 DIAGNOSIS — M7662 Achilles tendinitis, left leg: Secondary | ICD-10-CM

## 2022-03-03 DIAGNOSIS — S86012A Strain of left Achilles tendon, initial encounter: Secondary | ICD-10-CM | POA: Diagnosis not present

## 2022-03-03 NOTE — Progress Notes (Signed)
He presents today for follow-up of his tendinitis of his left Achilles.  He states that is okay it has it bad days and that it is really bad days sometimes it feels a little better than others.  States that he continues to take his anti-inflammatories ice it and continues to use his cam boot.  Objective: Vital signs are stable alert and oriented times three 81-monthhistory of pain to the Achilles with no significant etiology.  Still has erythema and warmth on palpation of the superior margin of the calcaneus at the insertion of the Achilles.  The Achilles is tender on plantarflexion against resistance.  There is still fluctuance over the area even where we injected it.  Assessment: Probable tear insertional tear of the Achilles secondary to longstanding Achilles tendinitis.  Plan: Requesting MRI at this point of his Achilles tendon of his left heel for differential diagnoses as well as surgical consideration.

## 2022-03-04 LAB — HM DIABETES EYE EXAM

## 2022-03-11 ENCOUNTER — Ambulatory Visit
Admission: RE | Admit: 2022-03-11 | Discharge: 2022-03-11 | Disposition: A | Discharge status: Home / Self Care | Payer: 59 | Source: Ambulatory Visit | Attending: Podiatry | Admitting: Podiatry

## 2022-03-11 DIAGNOSIS — S86012A Strain of left Achilles tendon, initial encounter: Secondary | ICD-10-CM

## 2022-03-12 ENCOUNTER — Ambulatory Visit (INDEPENDENT_AMBULATORY_CARE_PROVIDER_SITE_OTHER): Payer: 59 | Admitting: Bariatrics

## 2022-03-12 ENCOUNTER — Ambulatory Visit (INDEPENDENT_AMBULATORY_CARE_PROVIDER_SITE_OTHER): Payer: 59 | Admitting: Family Medicine

## 2022-03-12 ENCOUNTER — Encounter (INDEPENDENT_AMBULATORY_CARE_PROVIDER_SITE_OTHER): Payer: Self-pay | Admitting: Family Medicine

## 2022-03-12 VITALS — BP 135/81 | HR 56 | Temp 98.5°F | Ht 67.0 in | Wt 223.0 lb

## 2022-03-12 DIAGNOSIS — Z6834 Body mass index (BMI) 34.0-34.9, adult: Secondary | ICD-10-CM

## 2022-03-12 DIAGNOSIS — E669 Obesity, unspecified: Secondary | ICD-10-CM

## 2022-03-12 DIAGNOSIS — I10 Essential (primary) hypertension: Secondary | ICD-10-CM | POA: Diagnosis not present

## 2022-03-12 DIAGNOSIS — E1169 Type 2 diabetes mellitus with other specified complication: Secondary | ICD-10-CM | POA: Diagnosis not present

## 2022-03-12 DIAGNOSIS — E559 Vitamin D deficiency, unspecified: Secondary | ICD-10-CM

## 2022-03-12 DIAGNOSIS — R748 Abnormal levels of other serum enzymes: Secondary | ICD-10-CM | POA: Diagnosis not present

## 2022-03-12 DIAGNOSIS — Z7984 Long term (current) use of oral hypoglycemic drugs: Secondary | ICD-10-CM

## 2022-03-14 ENCOUNTER — Encounter: Payer: Self-pay | Admitting: Family Medicine

## 2022-03-18 ENCOUNTER — Telehealth: Payer: Self-pay | Admitting: *Deleted

## 2022-03-18 DIAGNOSIS — M7662 Achilles tendinitis, left leg: Secondary | ICD-10-CM

## 2022-03-18 NOTE — Telephone Encounter (Signed)
-----   Message from Garrel Ridgel, [place] sent at 03/17/2022  8:05 AM EDT ----- Negative for tear of the achilles.  Should consider PT for inflammation.

## 2022-03-19 NOTE — Addendum Note (Signed)
Addended by: Clovis Riley E on: 03/19/2022 01:42 PM   Modules accepted: Orders

## 2022-03-19 NOTE — Progress Notes (Signed)
Chief Complaint:   OBESITY Robert Mcguire is here to discuss his progress with his obesity treatment plan along with follow-up of his obesity related diagnoses. Robert Mcguire is on the Category 3 Plan and states he is following his eating plan approximately 80% of the time. [name] states he is walking and lifting weights for 60 minutes 5 times per week.  Today's visit was #: 2 Starting weight: 232 lbs Starting date: 01/28/2022 Today's weight: 223 lbs Today's date: 03/12/2022 Total lbs lost to date: 9 Total lbs lost since last in-office visit: 2  Interim History: Robert Mcguire has been traveling and going to football games.  He did a lot of walking and started strength training.  Subjective:   1. Type 2 diabetes mellitus with other specified complication, without long-term current use of insulin (HCC) Robert Mcguire's last A1c was 5.3 and at goal.  He is on Jardiance with no side effects.  2. Vitamin D deficiency Robert Mcguire's most recent vitamin D level was 35.6.  He is on OTC supplementation, unknown dose.  3. Elevated liver enzymes Robert Mcguire's last LFT was elevated.   4. Essential hypertension Robert Mcguire's home blood pressure monitoring is at goal. He is on beta blocker, ACE, and CCD.   Assessment/Plan:   1. Type 2 diabetes mellitus with other specified complication, without long-term current use of insulin (HCC) [place] will continue with his weight loss and Jardiance.  2. Vitamin D deficiency Robert Mcguire will continue OTC supplementation, and we will plan to recheck vitamin D level in November.  3. Elevated liver enzymes We will recheck liver enzymes in November, will consider an ultrasound if not resolved.  4. Essential hypertension [place] will continue his medications, weight loss, and exercise with a goal to reduce weight.  5. Obesity, Current BMI 34.9 Robert Mcguire is currently in the action stage of change. As such, his goal is to continue with weight loss efforts. He has agreed to the Category 3 Plan.   Exercise goals: As is.   Behavioral  modification strategies: increasing lean protein intake and travel eating strategies.  [place] has agreed to follow-up with our clinic in 3 weeks. He was informed of the importance of frequent follow-up visits to maximize his success with intensive lifestyle modifications for his multiple health conditions.   Objective:   Blood pressure 135/81, pulse (!) 56, temperature 98.5 F (36.9 C), height '5\' 7"'$  (1.702 m), weight 223 lb (101.2 kg), SpO2 100 %. Body mass index is 34.93 kg/m.  General: Cooperative, alert, well developed, in no acute distress. HEENT: Conjunctivae and lids unremarkable. Cardiovascular: Regular rhythm.  Lungs: Normal work of breathing. Neurologic: No focal deficits.   Lab Results  Component Value Date   CREATININE 0.81 01/29/2022   BUN 19 01/29/2022   NA 138 01/29/2022   K 4.4 01/29/2022   CL 99 01/29/2022   CO2 22 01/29/2022   Lab Results  Component Value Date   ALT 60 (H) 01/29/2022   AST 47 (H) 01/29/2022   ALKPHOS 65 01/29/2022   BILITOT 1.3 (H) 01/29/2022   Lab Results  Component Value Date   HGBA1C 5.3 01/29/2022   HGBA1C 5.6 09/30/2021   HGBA1C 5.5 03/15/2021   HGBA1C 5.4 09/20/2020   HGBA1C 5.4 06/08/2020   Lab Results  Component Value Date   INSULIN 10.3 01/29/2022   Lab Results  Component Value Date   TSH 1.810 01/29/2022   Lab Results  Component Value Date   CHOL 127 01/29/2022   HDL 44 01/29/2022   LDLCALC 70  01/29/2022   LDLDIRECT 168.0 05/13/2016   TRIG 63 01/29/2022   CHOLHDL 3 09/30/2021   Lab Results  Component Value Date   VD25OH 35.6 01/29/2022   VD25OH 33.70 09/20/2020   Lab Results  Component Value Date   WBC 4.1 09/30/2021   HGB 16.3 09/30/2021   HCT 48.3 09/30/2021   MCV 90.3 09/30/2021   PLT 162.0 09/30/2021   No results found for: "IRON", "TIBC", "FERRITIN"  Attestation Statements:   Reviewed by clinician on day of visit: allergies, medications, problem list, medical history, surgical history, family  history, social history, and previous encounter notes.  Time spent on visit including pre-visit chart review and post-visit care and charting was 30 minutes.   I, Trixie Dredge, am acting as transcriptionist for Dennard Nip, MD.  I have reviewed the above documentation for accuracy and completeness, and I agree with the above. -  Dennard Nip, MD

## 2022-04-02 ENCOUNTER — Ambulatory Visit: Payer: 59 | Attending: Podiatry

## 2022-04-02 DIAGNOSIS — R262 Difficulty in walking, not elsewhere classified: Secondary | ICD-10-CM | POA: Insufficient documentation

## 2022-04-02 DIAGNOSIS — M79672 Pain in left foot: Secondary | ICD-10-CM | POA: Diagnosis present

## 2022-04-02 DIAGNOSIS — M7662 Achilles tendinitis, left leg: Secondary | ICD-10-CM | POA: Diagnosis not present

## 2022-04-02 NOTE — Therapy (Signed)
[name] [address] [place], [address] Phone: [phone]   Fax:  [phone]  Physical Therapy Evaluation  Patient Details  Name: Robert Mcguire MRN: [id number] Date of Birth: 1961/03/19 Referring Provider (PT): [place], [place] T, [place]   Encounter Date: 04/02/2022   PT End of Session - 04/02/22 1128     Visit Number 1    Number of Visits 17    Date for PT Re-Evaluation 05/29/22    Authorization Type 1    Authorization Time Period of 10 progress report    PT Start Time 1128    PT Stop Time 1219    PT Time Calculation (min) 51 min    Activity Tolerance Patient tolerated treatment well    Behavior During Therapy [facility] for tasks assessed/performed             Past Medical History:  Diagnosis Date   Allergic rhinitis    Allergy    Arthritis    Diabetes mellitus without complication ([place])    ED (erectile dysfunction)    GERD (gastroesophageal reflux disease)    Glaucoma    High cholesterol    Hyperlipidemia    Hypertension    Impaired glucose tolerance    Nephrolithiasis    Obesity     Past Surgical History:  Procedure Laterality Date   COLONOSCOPY     difficultity with intabation     GYNECOMASTIA EXCISION Bilateral    POLYPECTOMY     right achilles tendon surgery      There were no vitals filed for this visit.    Subjective Assessment - 04/02/22 1133     Subjective L Achilles: 0/10 currently sitting and resting (7/10 when ambulating from waiting room to treatment room), 8/10 at worst for the past 3 months.    Pertinent History L Achilles Tendinitis. Pain began about 6 months ago, sudden onset, unknown mechanism of injury. Pt has R achilles surgery about 12 years ago to remove the bone spur. L achilles feels similar. Had an MRI which did not reveal a tear. PLOF: walks a few miles about 3-4 times a week and pain level when walking is about a 2-3/10. Has a CAM boot and wears it at home and  grocery store. Does not wear CAM boot when doing his walking and gym routine. Pain is about the same since onset.    Patient Stated Goals Pain free.    Currently in Pain? Yes    Pain Score 7     Pain Location Heel    Pain Orientation Left    Pain Type Chronic pain    Pain Onset More than a month ago    Pain Frequency Occasional    Aggravating Factors  First thing in the morning and first few steps after sitting for a while.    Pain Relieving Factors Calf stretch, ice.                [facility] PT Assessment - 04/02/22 1131       Assessment   Medical Diagnosis M76.62 (ICD-10-CM) - Achilles tendinitis of left lower extremity    Referring Provider (PT) Tyson D[place] T, [place]    Onset Date/Surgical Date 03/19/22   Date PT referral signed. Chronic condition   Prior Therapy Yes for R Achilles bone spur      Precautions   Precaution Comments No known precautions      Restrictions   Other Position/Activity Restrictions No  known restrictions      Balance Screen   Has the patient fallen in the past 6 months No    Has the patient had a decrease in activity level because of a fear of falling?  No    Is the patient reluctant to leave their home because of a fear of falling?  No      Observation/Other Assessments   Focus on Therapeutic Outcomes (FOTO)  L foot FOTO 63      Posture/Postural Control   Posture Comments Forward neck, R lateral shift, forward flexed, R hip in ER, L knee lower      AROM   Right Ankle Dorsiflexion 6   11 degrees AAROM   Left Ankle Dorsiflexion 1   13 degrees AAROM with Achilles stretch   Lumbar Flexion WFL    Lumbar Extension limited with R trunk rotation    Lumbar - Right Side Bend limited    Lumbar - Left Side Bend limited    Lumbar - Right Rotation WFL    Lumbar - Left Rotation WFL      PROM   Right Hip Internal Rotation  20    Left Hip Internal Rotation  18      Strength   Right Hip Flexion 4/5    Right Hip Extension 3+/5    Right Hip ABduction 4+/5     Left Hip Flexion 4/5    Left Hip Extension 4-/5    Left Hip ABduction 4+/5    Right Knee Flexion 5/5    Right Knee Extension 5/5    Left Knee Flexion 5/5    Left Knee Extension 5/5    Left Ankle Dorsiflexion 4+/5   seated manually resisted   Left Ankle Plantar Flexion 4+/5   seated manually resisted   Left Ankle Inversion 4-/5   seated manually resisted   Left Ankle Eversion 4+/5   seated manually resisted     Palpation   Palpation comment TTP L Achilles distal insertion, decreased fascial mobility L heel and plantar foot      Special Tests   Other special tests (+) Long sit test suggest anterior nutation of L innominate      Ambulation/Gait   Gait Comments antalgic, decreased stance L LE, L ankle IV                        Objective measurements completed on examination: See above findings.    No latex allergies Blood pressure and diabetes are controlled per pt             Manual therapy  Prone L ankle distraction with EV 10x5 seconds for 2 sets Prone STM L medial, posterior and lateral calcaneus to improve fascial mobility Prone STM L plantar foot to improve fascial mobility.   Response to treatment Pt tolerated session well without aggravation of symptoms.     Clinical impression Pt is a 61 year old male who came to physical therapy secondary L Achilles pain. He also presents with altered gait pattern and posture, L calcaneal inversion, decreased soft tissue mobility L calcaneus and plantar foot, limited L ankle DF ROM, bilateral glute max weakness, positive special test suggesting lumbopelvic involvement, and difficulty with L achilles pain first thing in the morning as well as with gait after prolonged sitting. Pt will benefit from skilled physical therapy services to address the aforementioned deficits.  PT Education - 04/02/22 1330     Education Details plan of care    Person(s) Educated Patient     Methods Explanation    Comprehension Verbalized understanding              PT Short Term Goals - 04/02/22 1252       PT SHORT TERM GOAL #1   Title Pt will be independent with his initial HEP to decrease pain, improve ankle ROM, strength, function and improve comfort with gait.    Baseline Pt has not yet started his HEP (04/02/2022)    Time 3    Period Weeks    Status New    Target Date 04/24/22               PT Long Term Goals - 04/02/22 1253       PT LONG TERM GOAL #1   Title Pt will have a decrease in L Achilles pain to 3/10 or less at worst to promote ability to ambulate first thing in the morning as well as after sitting for prolonged periods more comfortably.    Baseline 8/10 L Achilles pain at worst for the past 3 months (04/02/2022)    Time 8    Period Weeks    Status New    Target Date 05/29/22      PT LONG TERM GOAL #2   Title Pt will improve his FOTO score by at least 10 points as a demonstration of improved function.    Baseline L foot FOTO 63 (04/02/2022)    Time 8    Period Weeks    Status New    Target Date 05/29/22      PT LONG TERM GOAL #3   Title Pt will improve L ankle DF AROM to at least 15 degrees to promote ability to ambulate more comfortably after prolonged sitting and first thing in the morning.    Baseline L ankle DF AROM 1 degree (04/02/2022)    Time 8    Period Weeks    Status New    Target Date 05/29/22      PT LONG TERM GOAL #4   Title Pt will improve B hip extension strength by at least 1/2 MMT to promote ability to ambulate more comfortably.    Baseline Hip extension R 3+/5, L 4-/5 (04/02/2022)    Time 8    Period Weeks    Status New    Target Date 05/29/22                    Plan - 04/02/22 1247     Clinical Impression Statement Pt is a 61 year old male who came to physical therapy secondary L Achilles pain. He also presents with altered gait pattern and posture, L calcaneal inversion, decreased soft tissue  mobility L calcaneus and plantar foot, limited L ankle DF ROM, bilateral glute max weakness, positive special test suggesting lumbopelvic involvement, and difficulty with L achilles pain first thing in the morning as well as with gait after prolonged sitting. Pt will benefit from skilled physical therapy services to address the aforementioned deficits.    Personal Factors and Comorbidities Comorbidity 2;Past/Current Experience;Time since onset of injury/illness/exacerbation;Fitness    Comorbidities DM, HTN,    Examination-Activity Limitations Locomotion Level;Stairs;Carry    Stability/Clinical Decision Making Stable/Uncomplicated    Clinical Decision Making Low    Rehab Potential Fair    PT Frequency 2x / week    PT Duration 8  weeks    PT Treatment/Interventions Manual techniques;Neuromuscular re-education;Therapeutic exercise;Therapeutic activities;Gait training;Stair training;Functional mobility training;Balance training;Patient/family education;Dry needling;Electrical Stimulation;Iontophoresis '4mg'$ /ml Dexamethasone    PT Next Visit Plan L ankle ROM, hip strengthening, manual techniques, modalities PRN    Consulted and Agree with Plan of Care Patient             Patient will benefit from skilled therapeutic intervention in order to improve the following deficits and impairments:  Pain, Postural dysfunction, Improper body mechanics, Difficulty walking, Decreased strength, Decreased range of motion, Abnormal gait  Visit Diagnosis: Pain in left foot - Plan: PT plan of care cert/re-cert  Difficulty in walking, not elsewhere classified - Plan: PT plan of care cert/re-cert     Problem List Patient Active Problem List   Diagnosis Date Noted   Elevated liver enzymes 01/30/2022   Other fatigue 01/28/2022   SOB (shortness of breath) on exertion 01/28/2022   Diabetes mellitus ([place]) 01/28/2022   Mixed hyperlipidemia 01/28/2022   Health care maintenance 01/28/2022   Vitamin D deficiency  01/28/2022   Class 2 severe obesity with serious comorbidity and body mass index (BMI) of 36.0 to 36.9 in adult ([place]) 01/28/2022   Type 2 diabetes mellitus without complication, without long-term current use of insulin ([place]) 08/20/2016   Hx of colonic polyps 09/30/2011   GYNECOMASTIA 01/15/2010   NEPHROLITHIASIS, HX OF 05/04/2008   SHOULDER PAIN, LEFT 11/08/2007   Obesity 01/22/2007   ERECTILE DYSFUNCTION 01/22/2007   Essential hypertension 01/22/2007   ALLERGIC RHINITIS, SEASONAL 01/22/2007   GERD 01/22/2007   HYPOKALEMIA, HX OF 01/22/2007   Joneen Boers PT, DPT  04/02/2022, 1:34 PM   [facility] [address], [place], [address] Phone: [phone]   Fax:  [phone]  Name: Robert Mcguire MRN: [id number] Date of Birth: 11/30/1960

## 2022-04-07 ENCOUNTER — Ambulatory Visit: Payer: 59

## 2022-04-08 ENCOUNTER — Ambulatory Visit: Payer: 59 | Attending: Podiatry

## 2022-04-08 ENCOUNTER — Ambulatory Visit (INDEPENDENT_AMBULATORY_CARE_PROVIDER_SITE_OTHER): Payer: 59 | Admitting: Family Medicine

## 2022-04-08 ENCOUNTER — Encounter (INDEPENDENT_AMBULATORY_CARE_PROVIDER_SITE_OTHER): Payer: Self-pay | Admitting: Family Medicine

## 2022-04-08 VITALS — BP 131/72 | HR 61 | Temp 98.6°F | Ht 67.0 in | Wt 219.0 lb

## 2022-04-08 DIAGNOSIS — E669 Obesity, unspecified: Secondary | ICD-10-CM | POA: Diagnosis not present

## 2022-04-08 DIAGNOSIS — R262 Difficulty in walking, not elsewhere classified: Secondary | ICD-10-CM | POA: Insufficient documentation

## 2022-04-08 DIAGNOSIS — Z6834 Body mass index (BMI) 34.0-34.9, adult: Secondary | ICD-10-CM

## 2022-04-08 DIAGNOSIS — I10 Essential (primary) hypertension: Secondary | ICD-10-CM | POA: Diagnosis not present

## 2022-04-08 DIAGNOSIS — E1169 Type 2 diabetes mellitus with other specified complication: Secondary | ICD-10-CM | POA: Diagnosis not present

## 2022-04-08 DIAGNOSIS — Z7985 Long-term (current) use of injectable non-insulin antidiabetic drugs: Secondary | ICD-10-CM

## 2022-04-08 DIAGNOSIS — E782 Mixed hyperlipidemia: Secondary | ICD-10-CM

## 2022-04-08 DIAGNOSIS — R001 Bradycardia, unspecified: Secondary | ICD-10-CM | POA: Diagnosis not present

## 2022-04-08 DIAGNOSIS — M79672 Pain in left foot: Secondary | ICD-10-CM | POA: Insufficient documentation

## 2022-04-08 NOTE — Therapy (Signed)
OUTPATIENT PHYSICAL THERAPY TREATMENT NOTE   Patient Name: Robert Mcguire MRN: [id number] DOB:Sep 16, 1960, 61 y.o., male Today's Date: 04/08/2022  PCP: [name], MD REFERRING PROVIDER: Garrel Ridgel, DPM   PT End of Session - 04/08/22 1636     Visit Number 2    Number of Visits 17    Date for PT Re-Evaluation 05/29/22    Authorization Type 2    Authorization Time Period of 10 progress report    PT Start Time 1637    PT Stop Time 8527    PT Time Calculation (min) 40 min    Activity Tolerance Patient tolerated treatment well    Behavior During Therapy WFL for tasks assessed/performed             Past Medical History:  Diagnosis Date   Allergic rhinitis    Allergy    Arthritis    Diabetes mellitus without complication ([place])    ED (erectile dysfunction)    GERD (gastroesophageal reflux disease)    Glaucoma    High cholesterol    Hyperlipidemia    Hypertension    Impaired glucose tolerance    Nephrolithiasis    Obesity    Past Surgical History:  Procedure Laterality Date   COLONOSCOPY     difficultity with intabation     GYNECOMASTIA EXCISION Bilateral    POLYPECTOMY     right achilles tendon surgery     Patient Active Problem List   Diagnosis Date Noted   Bradycardia 04/08/2022   Elevated liver enzymes 01/30/2022   Other fatigue 01/28/2022   SOB (shortness of breath) on exertion 01/28/2022   Diabetes mellitus ([place]) 01/28/2022   Mixed hyperlipidemia 01/28/2022   Health care maintenance 01/28/2022   Vitamin D deficiency 01/28/2022   Class 2 severe obesity with serious comorbidity and body mass index (BMI) of 36.0 to 36.9 in adult ([place]) 01/28/2022   Type 2 diabetes mellitus without complication, without long-term current use of insulin ([place]) 08/20/2016   Hx of colonic polyps 09/30/2011   GYNECOMASTIA 01/15/2010   NEPHROLITHIASIS, HX OF 05/04/2008   SHOULDER PAIN, LEFT 11/08/2007   Obesity 01/22/2007   ERECTILE DYSFUNCTION 01/22/2007   Essential  hypertension 01/22/2007   ALLERGIC RHINITIS, SEASONAL 01/22/2007   GERD 01/22/2007   HYPOKALEMIA, HX OF 01/22/2007    REFERRING DIAG: M76.62 (ICD-10-CM) - Achilles tendinitis of left lower extremity   THERAPY DIAG:  Pain in left foot  Difficulty in walking, not elsewhere classified  Rationale for Evaluation and Treatment Rehabilitation  PERTINENT HISTORY: L Achilles Tendinitis. Pain began about 6 months ago, sudden onset, unknown mechanism of injury. Pt has R achilles surgery about 12 years ago to remove the bone spur. L achilles feels similar. Had an MRI which did not reveal a tear. PLOF: walks a few miles about 3-4 times a week and pain level when walking is about a 2-3/10. Has a CAM boot and wears it at home and grocery store. Does not wear CAM boot when doing his walking and gym routine. Pain is about the same since onset.  PRECAUTIONS: No known precautions   SUBJECTIVE: L Achilles is a little better than last time, marginally better. Has not been walking his daily walk.   PAIN:  Are you having pain? 2-3/10     TODAY'S TREATMENT:    Manual therapy   Prone L ankle distraction with EV 10x5 seconds for 2 sets Prone STM L medial, posterior and lateral calcaneus to improve fascial mobility Prone STM  L plantar foot to improve fascial mobility.     Therapeutic exercise Prone L ankle isometrics in neutral   PF 10x5 seconds for 3 sets   Standing heel raises B UE assist 10x3  Decreased L heel pain.   Standing B ankle PF/DF on rocker board with B UE assist 2 minutes  Static mini lunge with contralateral UE assist   L 10x2  R 10x2  Mini squats 10x2    Improved exercise technique, movement at target joints, use of target muscles after mod verbal, visual, tactile cues.       Response to treatment Pt tolerated session well without aggravation of symptoms. Decreased L achilles pain after exercises reported.           Clinical impression  Continued working on  promoting more neutral L calcaneal position, improving soft tissue mobility and blood flow, and promoting proper stress to Achilles tendon to promote healing. Decreased pain reported during gait after exercises. Pt will benefit from continued skilled physical therapy services to decrease pain, improve strength and function.     PATIENT EDUCATION: Education details: there-ex, HEP Person educated: Patient Education method: Explanation, Demonstration, Tactile cues, Verbal cues, and Handouts Education comprehension: verbalized understanding and returned demonstration   HOME EXERCISE PROGRAM: Medbridge Access Code: [email or web address] URL: [email or web address] Date: 04/08/2022 Prepared by: Omega with Support  - 1 x daily - 7 x weekly - 3 sets - 10 reps - Mini Squat with Counter Support  - 1 x daily - 7 x weekly - 3 sets - 10 reps     PT Short Term Goals - 04/02/22 1252       PT SHORT TERM GOAL #1   Title Pt will be independent with his initial HEP to decrease pain, improve ankle ROM, strength, function and improve comfort with gait.    Baseline Pt has not yet started his HEP (04/02/2022)    Time 3    Period Weeks    Status New    Target Date 04/24/22              PT Long Term Goals - 04/02/22 1253       PT LONG TERM GOAL #1   Title Pt will have a decrease in L Achilles pain to 3/10 or less at worst to promote ability to ambulate first thing in the morning as well as after sitting for prolonged periods more comfortably.    Baseline 8/10 L Achilles pain at worst for the past 3 months (04/02/2022)    Time 8    Period Weeks    Status New    Target Date 05/29/22      PT LONG TERM GOAL #2   Title Pt will improve his FOTO score by at least 10 points as a demonstration of improved function.    Baseline L foot FOTO 63 (04/02/2022)    Time 8    Period Weeks    Status New    Target Date 05/29/22      PT LONG TERM GOAL #3   Title Pt  will improve L ankle DF AROM to at least 15 degrees to promote ability to ambulate more comfortably after prolonged sitting and first thing in the morning.    Baseline L ankle DF AROM 1 degree (04/02/2022)    Time 8    Period Weeks    Status New    Target Date 05/29/22      PT LONG TERM  GOAL #4   Title Pt will improve B hip extension strength by at least 1/2 MMT to promote ability to ambulate more comfortably.    Baseline Hip extension R 3+/5, L 4-/5 (04/02/2022)    Time 8    Period Weeks    Status New    Target Date 05/29/22              Plan - 04/08/22 1708     Clinical Impression Statement Continued working on promoting more neutral L calcaneal position, improving soft tissue mobility and blood flow, and promoting proper stress to Achilles tendon to promote healing. Decreased pain reported during gait after exercises. Pt will benefit from continued skilled physical therapy services to decrease pain, improve strength and function.    Personal Factors and Comorbidities Comorbidity 2;Past/Current Experience;Time since onset of injury/illness/exacerbation;Fitness    Comorbidities DM, HTN,    Examination-Activity Limitations Locomotion Level;Stairs;Carry    Stability/Clinical Decision Making Stable/Uncomplicated    Clinical Decision Making Low    Rehab Potential Fair    PT Frequency 2x / week    PT Duration 8 weeks    PT Treatment/Interventions Manual techniques;Neuromuscular re-education;Therapeutic exercise;Therapeutic activities;Gait training;Stair training;Functional mobility training;Balance training;Patient/family education;Dry needling;Electrical Stimulation;Iontophoresis '4mg'$ /ml Dexamethasone    PT Next Visit Plan L ankle ROM, hip strengthening, manual techniques, modalities PRN    PT Home Exercise Plan Medbridge Access Code: [email or web address]    Consulted and Agree with Plan of Care Patient              Joneen Boers PT, DPT  04/08/2022, 5:29 PM

## 2022-04-14 ENCOUNTER — Ambulatory Visit: Payer: 59

## 2022-04-14 DIAGNOSIS — M79672 Pain in left foot: Secondary | ICD-10-CM | POA: Diagnosis not present

## 2022-04-14 DIAGNOSIS — R262 Difficulty in walking, not elsewhere classified: Secondary | ICD-10-CM | POA: Diagnosis not present

## 2022-04-14 NOTE — Therapy (Signed)
OUTPATIENT PHYSICAL THERAPY TREATMENT NOTE   Patient Name: Robert Mcguire MRN: [id number] DOB:[birth year], 61 y.o., male Today's Date: 04/14/2022  PCP: [name], MD REFERRING PROVIDER: Garrel Ridgel, DPM   PT End of Session - 04/14/22 1503     Visit Number 3    Number of Visits 17    Date for PT Re-Evaluation 05/29/22    Authorization Type 3    Authorization Time Period of 10 progress report    PT Start Time 1503    PT Stop Time 0938    PT Time Calculation (min) 40 min    Activity Tolerance Patient tolerated treatment well    Behavior During Therapy WFL for tasks assessed/performed              Past Medical History:  Diagnosis Date   Allergic rhinitis    Allergy    Arthritis    Diabetes mellitus without complication ([place])    ED (erectile dysfunction)    GERD (gastroesophageal reflux disease)    Glaucoma    High cholesterol    Hyperlipidemia    Hypertension    Impaired glucose tolerance    Nephrolithiasis    Obesity    Past Surgical History:  Procedure Laterality Date   COLONOSCOPY     difficultity with intabation     GYNECOMASTIA EXCISION Bilateral    POLYPECTOMY     right achilles tendon surgery     Patient Active Problem List   Diagnosis Date Noted   Bradycardia 04/08/2022   Elevated liver enzymes 01/30/2022   Other fatigue 01/28/2022   SOB (shortness of breath) on exertion 01/28/2022   Diabetes mellitus ([place]) 01/28/2022   Mixed hyperlipidemia 01/28/2022   Health care maintenance 01/28/2022   Vitamin D deficiency 01/28/2022   Class 2 severe obesity with serious comorbidity and body mass index (BMI) of 36.0 to 36.9 in adult ([place]) 01/28/2022   Type 2 diabetes mellitus without complication, without long-term current use of insulin ([place]) 08/20/2016   Hx of colonic polyps 09/30/2011   GYNECOMASTIA 01/15/2010   NEPHROLITHIASIS, HX OF 05/04/2008   SHOULDER PAIN, LEFT 11/08/2007   Obesity 01/22/2007   ERECTILE DYSFUNCTION 01/22/2007   Essential  hypertension 01/22/2007   ALLERGIC RHINITIS, SEASONAL 01/22/2007   GERD 01/22/2007   HYPOKALEMIA, HX OF 01/22/2007    REFERRING DIAG: M76.62 (ICD-10-CM) - Achilles tendinitis of left lower extremity   THERAPY DIAG:  Pain in left foot  Difficulty in walking, not elsewhere classified  Rationale for Evaluation and Treatment Rehabilitation  PERTINENT HISTORY: L Achilles Tendinitis. Pain began about 6 months ago, sudden onset, unknown mechanism of injury. Pt has R achilles surgery about 12 years ago to remove the bone spur. L achilles feels similar. Had an MRI which did not reveal a tear. PLOF: walks a few miles about 3-4 times a week and pain level when walking is about a 2-3/10. Has a CAM boot and wears it at home and grocery store. Does not wear CAM boot when doing his walking and gym routine. Pain is about the same since onset.  PRECAUTIONS: No known precautions   SUBJECTIVE: L Achilles is doing a little better. Trying not to be on it as much. Walking bothers is more later in the day. Decreased to walking half a mile for right now.     PAIN:  Are you having pain? 2/10 when walking from waiting room to treatment room.      TODAY'S TREATMENT:    Manual therapy  Prone L ankle distraction with EV 10x5 seconds for 2 sets Prone STM L medial, posterior and lateral calcaneus to improve fascial mobility Prone STM L plantar foot to improve fascial mobility.     Therapeutic exercise Prone L ankle isometrics in neutral   PF 10x5 seconds for 3 sets  Prone manually resisted L ankle eccentric PF 10x2  Standing B ankle PF/DF on rocker board with B UE assist 2 minutes   Standing heel raises B UE assist 10x3  Decreased L heel pain.   Standing B gastroc stretch at first stair step with B UE assist 30 seconds x 3    Improved exercise technique, movement at target joints, use of target muscles after mod verbal, visual, tactile cues.       Response to treatment Pt tolerated  session well without aggravation of symptoms.          Clinical impression   Pt improving L Achilles pain during gait based on levels reported past 3 sessions. Continued working on promoting more neutral L calcaneal position, improving soft tissue mobility and blood flow, and promoting proper stress to Achilles tendon to promote healing. Pt tolerated session well without aggravation of symptoms. Pt will benefit from continued skilled physical therapy services to decrease pain, improve strength and function.     PATIENT EDUCATION: Education details: there-ex, HEP Person educated: Patient Education method: Explanation, Demonstration, Tactile cues, Verbal cues, and Handouts Education comprehension: verbalized understanding and returned demonstration   HOME EXERCISE PROGRAM: Medbridge Access Code: [email or web address] URL: [email or web address] Date: 04/08/2022 Prepared by: [place] with Support  - 1 x daily - 7 x weekly - 3 sets - 10 reps - Mini Squat with Counter Support  - 1 x daily - 7 x weekly - 3 sets - 10 reps     PT Short Term Goals - 04/02/22 1252       PT SHORT TERM GOAL #1   Title Pt will be independent with his initial HEP to decrease pain, improve ankle ROM, strength, function and improve comfort with gait.    Baseline Pt has not yet started his HEP (04/02/2022)    Time 3    Period Weeks    Status New    Target Date 04/24/22              PT Long Term Goals - 04/02/22 1253       PT LONG TERM GOAL #1   Title Pt will have a decrease in L Achilles pain to 3/10 or less at worst to promote ability to ambulate first thing in the morning as well as after sitting for prolonged periods more comfortably.    Baseline 8/10 L Achilles pain at worst for the past 3 months (04/02/2022)    Time 8    Period Weeks    Status New    Target Date 05/29/22      PT LONG TERM GOAL #2   Title Pt will improve his FOTO score by at least 10  points as a demonstration of improved function.    Baseline L foot FOTO 63 (04/02/2022)    Time 8    Period Weeks    Status New    Target Date 05/29/22      PT LONG TERM GOAL #3   Title Pt will improve L ankle DF AROM to at least 15 degrees to promote ability to ambulate more comfortably after prolonged sitting and first thing in the morning.  Baseline L ankle DF AROM 1 degree (04/02/2022)    Time 8    Period Weeks    Status New    Target Date 05/29/22      PT LONG TERM GOAL #4   Title Pt will improve B hip extension strength by at least 1/2 MMT to promote ability to ambulate more comfortably.    Baseline Hip extension R 3+/5, L 4-/5 (04/02/2022)    Time 8    Period Weeks    Status New    Target Date 05/29/22              Plan - 04/14/22 1502     Clinical Impression Statement Pt improving L Achilles pain during gait based on levels reported past 3 sessions. Continued working on promoting more neutral L calcaneal position, improving soft tissue mobility and blood flow, and promoting proper stress to Achilles tendon to promote healing. Pt tolerated session well without aggravation of symptoms. Pt will benefit from continued skilled physical therapy services to decrease pain, improve strength and function.    Personal Factors and Comorbidities Comorbidity 2;Past/Current Experience;Time since onset of injury/illness/exacerbation;Fitness    Comorbidities DM, HTN,    Examination-Activity Limitations Locomotion Level;Stairs;Carry    Stability/Clinical Decision Making Stable/Uncomplicated    Clinical Decision Making Low    Rehab Potential Fair    PT Frequency 2x / week    PT Duration 8 weeks    PT Treatment/Interventions Manual techniques;Neuromuscular re-education;Therapeutic exercise;Therapeutic activities;Gait training;Stair training;Functional mobility training;Balance training;Patient/family education;Dry needling;Electrical Stimulation;Iontophoresis '4mg'$ /ml Dexamethasone    PT  Next Visit Plan L ankle ROM, hip strengthening, manual techniques, modalities PRN    PT Home Exercise Plan Medbridge Access Code: [email or web address]    Consulted and Agree with Plan of Care Patient              Joneen Boers PT, DPT  04/14/2022, 3:53 PM

## 2022-04-15 NOTE — Progress Notes (Signed)
Chief Complaint:   OBESITY Sender is here to discuss his progress with his obesity treatment plan along with follow-up of his obesity related diagnoses. Robert Mcguire is on the Category 3 Plan and states he is following his eating plan approximately 80% of the time. Robert Mcguire states he is walking and doing strengthening for 45 minutes 4 times per week.  Today's visit was #: 4 Starting weight: 232 lbs Starting date: 01/28/2022 Today's weight: 219 lbs Today's date: 04/08/2022 Total lbs lost to date: 13 Total lbs lost since last in-office visit: 4  Interim History: Robert Mcguire has done well with his weight loss.  He is going to physical therapy for Achilles tendinitis.  He notes some mild hunger at nighttime and he is eating a [place] bar around 10 PM.  He does well with getting protein in around 30 grams each meal.  He is eating nuts, but limiting the amount.  Subjective:   1. Type 2 diabetes mellitus with other specified complication, without long-term current use of insulin (HCC) Robert Mcguire's last A1c was 5.3, at goal.  He is taking Jardiance with no side effects noted.  He is doing well with his diet and weight loss.  2. Essential hypertension Robert Mcguire's blood pressure is at goal.  He reports that his watch is reporting some heart rates of <50 at times.  We discussed that metoprolol will slow heart rate.  He denies symptoms currently.  3. Bradycardia Robert Mcguire is asymptomatic and his heart rate is decreased with increased exercise, low baseline.  His EKG is with left ventricle hypertrophy.  He is on metoprolol and other medications.  His blood pressure is at goal, but he has no history of a 2D echo.  4. Mixed hyperlipidemia Robert Mcguire is really taking Lipitor with no side effects noted.  Assessment/Plan:   1. Type 2 diabetes mellitus with other specified complication, without long-term current use of insulin (HCC) Robert Mcguire will continue with his diet, exercise, and Jardiance.  2. Essential hypertension Robert Mcguire will continue his usual  medications, keep hydrated, and will continue with his diet and exercise.  We will follow-up at his next visit.  3. Bradycardia Robert Mcguire was referred to cardiology for bradycardia and left ventricle hypertrophy.  - Ambulatory referral to Cardiology  4. Mixed hyperlipidemia Ac will continue Lipitor, his diet, and exercise.  5. Obesity, Current BMI 34.4 Robert Mcguire is currently in the action stage of change. As such, his goal is to continue with weight loss efforts. He has agreed to the Category 3 Plan.   Exercise goals: Continue strengthening at the gym.  Behavioral modification strategies: increasing lean protein intake, decreasing simple carbohydrates, and increasing water intake.  Robert Mcguire has agreed to follow-up with our clinic in 3 to 4 weeks. He was informed of the importance of frequent follow-up visits to maximize his success with intensive lifestyle modifications for his multiple health conditions.   Objective:   Blood pressure 131/72, pulse 61, temperature 98.6 F (37 C), height '5\' 7"'$  (1.702 m), weight 219 lb (99.3 kg), SpO2 100 %. Body mass index is 34.3 kg/m.  General: Cooperative, alert, well developed, in no acute distress. HEENT: Conjunctivae and lids unremarkable. Cardiovascular: Regular rhythm.  Lungs: Normal work of breathing. Neurologic: No focal deficits.   Lab Results  Component Value Date   CREATININE 0.81 01/29/2022   BUN 19 01/29/2022   NA 138 01/29/2022   K 4.4 01/29/2022   CL 99 01/29/2022   CO2 22 01/29/2022   Lab Results  Component Value  Date   ALT 60 (H) 01/29/2022   AST 47 (H) 01/29/2022   ALKPHOS 65 01/29/2022   BILITOT 1.3 (H) 01/29/2022   Lab Results  Component Value Date   HGBA1C 5.3 01/29/2022   HGBA1C 5.6 09/30/2021   HGBA1C 5.5 03/15/2021   HGBA1C 5.4 09/20/2020   HGBA1C 5.4 06/08/2020   Lab Results  Component Value Date   INSULIN 10.3 01/29/2022   Lab Results  Component Value Date   TSH 1.810 01/29/2022   Lab Results  Component  Value Date   CHOL 127 01/29/2022   HDL 44 01/29/2022   LDLCALC 70 01/29/2022   LDLDIRECT 168.0 05/13/2016   TRIG 63 01/29/2022   CHOLHDL 3 09/30/2021   Lab Results  Component Value Date   VD25OH 35.6 01/29/2022   VD25OH 33.70 09/20/2020   Lab Results  Component Value Date   WBC 4.1 09/30/2021   HGB 16.3 09/30/2021   HCT 48.3 09/30/2021   MCV 90.3 09/30/2021   PLT 162.0 09/30/2021   No results found for: "IRON", "TIBC", "FERRITIN"  Attestation Statements:   Reviewed by clinician on day of visit: allergies, medications, problem list, medical history, surgical history, family history, social history, and previous encounter notes.   I, Trixie Dredge, am acting as transcriptionist for Dennard Nip, MD.  I have reviewed the above documentation for accuracy and completeness, and I agree with the above. -  Dennard Nip, MD

## 2022-04-16 ENCOUNTER — Ambulatory Visit: Payer: 59

## 2022-04-16 DIAGNOSIS — M79672 Pain in left foot: Secondary | ICD-10-CM

## 2022-04-16 DIAGNOSIS — R262 Difficulty in walking, not elsewhere classified: Secondary | ICD-10-CM

## 2022-04-16 NOTE — Therapy (Signed)
OUTPATIENT PHYSICAL THERAPY TREATMENT NOTE   Patient Name: Robert Mcguire MRN: [id number] DOB:1960-09-15, 61 y.o., male Today's Date: 04/16/2022  PCP: [name], MD REFERRING PROVIDER: Garrel Ridgel, DPM   PT End of Session - 04/16/22 1630     Visit Number 4    Number of Visits 17    Date for PT Re-Evaluation 05/29/22    Authorization Type 4    Authorization Time Period of 10 progress report    PT Start Time 65    PT Stop Time 9924    PT Time Calculation (min) 43 min    Activity Tolerance Patient tolerated treatment well    Behavior During Therapy WFL for tasks assessed/performed               Past Medical History:  Diagnosis Date   Allergic rhinitis    Allergy    Arthritis    Diabetes mellitus without complication ([place])    ED (erectile dysfunction)    GERD (gastroesophageal reflux disease)    Glaucoma    High cholesterol    Hyperlipidemia    Hypertension    Impaired glucose tolerance    Nephrolithiasis    Obesity    Past Surgical History:  Procedure Laterality Date   COLONOSCOPY     difficultity with intabation     GYNECOMASTIA EXCISION Bilateral    POLYPECTOMY     right achilles tendon surgery     Patient Active Problem List   Diagnosis Date Noted   Bradycardia 04/08/2022   Elevated liver enzymes 01/30/2022   Other fatigue 01/28/2022   SOB (shortness of breath) on exertion 01/28/2022   Diabetes mellitus ([place]) 01/28/2022   Mixed hyperlipidemia 01/28/2022   Health care maintenance 01/28/2022   Vitamin D deficiency 01/28/2022   Class 2 severe obesity with serious comorbidity and body mass index (BMI) of 36.0 to 36.9 in adult ([place]) 01/28/2022   Type 2 diabetes mellitus without complication, without long-term current use of insulin ([place]) 08/20/2016   Hx of colonic polyps 09/30/2011   GYNECOMASTIA 01/15/2010   NEPHROLITHIASIS, HX OF 05/04/2008   SHOULDER PAIN, LEFT 11/08/2007   Obesity 01/22/2007   ERECTILE DYSFUNCTION 01/22/2007   Essential  hypertension 01/22/2007   ALLERGIC RHINITIS, SEASONAL 01/22/2007   GERD 01/22/2007   HYPOKALEMIA, HX OF 01/22/2007    REFERRING DIAG: M76.62 (ICD-10-CM) - Achilles tendinitis of left lower extremity   THERAPY DIAG:  Pain in left foot  Difficulty in walking, not elsewhere classified  Rationale for Evaluation and Treatment Rehabilitation  PERTINENT HISTORY: L Achilles Tendinitis. Pain began about 6 months ago, sudden onset, unknown mechanism of injury. Pt has R achilles surgery about 12 years ago to remove the bone spur. L achilles feels similar. Had an MRI which did not reveal a tear. PLOF: walks a few miles about 3-4 times a week and pain level when walking is about a 2-3/10. Has a CAM boot and wears it at home and grocery store. Does not wear CAM boot when doing his walking and gym routine. Pain is about the same since onset.  PRECAUTIONS: No known precautions   SUBJECTIVE: L Achilles is doing pretty good right now but was taking Mobic for the past couple of days. Good after the sessions. Whenever he gets up from sitting, his Achilles bothers him. Moving helps. Did not take Mobic last week.     PAIN:  Are you having pain? 1/10 when walking from waiting room to treatment room.  TODAY'S TREATMENT:    Manual therapy   Prone L ankle distraction with EV 10x5 seconds for 2 sets Prone STM L medial, posterior and lateral calcaneus to improve fascial mobility Prone STM L plantar foot to improve fascial mobility. Prone STM L Achilles and distal gastroc to decrease fascial restrictions   Therapeutic exercise Prone L ankle isometrics in neutral   PF 10x5 seconds for 3 sets  Static mini lunge with contralateral UE assist  L 10x3  R 10x3  Standing B ankle PF/DF on rocker board with B UE assist 2 minutes  Standing L great toe stretch 30 seconds for 3 sets  Standing B gastroc stretch at first stair step with B UE assist 30 seconds x 3       Improved exercise technique,  movement at target joints, use of target muscles after mod verbal, visual, tactile cues.       Response to treatment Pt tolerated session well without aggravation of symptoms.          Clinical impression  Continued working on promoting more neutral L calcaneal position, improving soft tissue mobility and blood flow, and promoting proper stress to Achilles tendon to promote healing. Also worked on improving  plantar foot and gastroc flexibility to decrease stress to calcaneus. Pt tolerated session well without aggravation of symptoms. Pt will benefit from continued skilled physical therapy services to decrease pain, improve strength and function.     PATIENT EDUCATION: Education details: there-ex, HEP Person educated: Patient Education method: Explanation, Demonstration, Tactile cues, Verbal cues, and Handouts Education comprehension: verbalized understanding and returned demonstration   HOME EXERCISE PROGRAM: Medbridge Access Code: [email or web address] URL: [email or web address] Date: 04/08/2022 Prepared by: [place] with Support  - 1 x daily - 7 x weekly - 3 sets - 10 reps - Mini Squat with Counter Support  - 1 x daily - 7 x weekly - 3 sets - 10 reps     PT Short Term Goals - 04/02/22 1252       PT SHORT TERM GOAL #1   Title Pt will be independent with his initial HEP to decrease pain, improve ankle ROM, strength, function and improve comfort with gait.    Baseline Pt has not yet started his HEP (04/02/2022)    Time 3    Period Weeks    Status New    Target Date 04/24/22              PT Long Term Goals - 04/02/22 1253       PT LONG TERM GOAL #1   Title Pt will have a decrease in L Achilles pain to 3/10 or less at worst to promote ability to ambulate first thing in the morning as well as after sitting for prolonged periods more comfortably.    Baseline 8/10 L Achilles pain at worst for the past 3 months (04/02/2022)     Time 8    Period Weeks    Status New    Target Date 05/29/22      PT LONG TERM GOAL #2   Title Pt will improve his FOTO score by at least 10 points as a demonstration of improved function.    Baseline L foot FOTO 63 (04/02/2022)    Time 8    Period Weeks    Status New    Target Date 05/29/22      PT LONG TERM GOAL #3   Title Pt will improve L ankle DF  AROM to at least 15 degrees to promote ability to ambulate more comfortably after prolonged sitting and first thing in the morning.    Baseline L ankle DF AROM 1 degree (04/02/2022)    Time 8    Period Weeks    Status New    Target Date 05/29/22      PT LONG TERM GOAL #4   Title Pt will improve B hip extension strength by at least 1/2 MMT to promote ability to ambulate more comfortably.    Baseline Hip extension R 3+/5, L 4-/5 (04/02/2022)    Time 8    Period Weeks    Status New    Target Date 05/29/22              Plan - 04/16/22 1626     Clinical Impression Statement Continued working on promoting more neutral L calcaneal position, improving soft tissue mobility and blood flow, and promoting proper stress to Achilles tendon to promote healing. Also worked on improving  plantar foot and gastroc flexibility to decrease stress to calcaneus. Pt tolerated session well without aggravation of symptoms. Pt will benefit from continued skilled physical therapy services to decrease pain, improve strength and function.    Personal Factors and Comorbidities Comorbidity 2;Past/Current Experience;Time since onset of injury/illness/exacerbation;Fitness    Comorbidities DM, HTN,    Examination-Activity Limitations Locomotion Level;Stairs;Carry    Stability/Clinical Decision Making Stable/Uncomplicated    Clinical Decision Making Low    Rehab Potential Fair    PT Frequency 2x / week    PT Duration 8 weeks    PT Treatment/Interventions Manual techniques;Neuromuscular re-education;Therapeutic exercise;Therapeutic activities;Gait training;Stair  training;Functional mobility training;Balance training;Patient/family education;Dry needling;Electrical Stimulation;Iontophoresis '4mg'$ /ml Dexamethasone    PT Next Visit Plan L ankle ROM, hip strengthening, manual techniques, modalities PRN    PT Home Exercise Plan Medbridge Access Code: [email or web address]    Consulted and Agree with Plan of Care Patient               Joneen Boers PT, DPT  04/16/2022, 5:17 PM

## 2022-04-21 ENCOUNTER — Ambulatory Visit: Payer: 59

## 2022-04-24 ENCOUNTER — Ambulatory Visit: Payer: 59

## 2022-04-24 DIAGNOSIS — M79672 Pain in left foot: Secondary | ICD-10-CM | POA: Diagnosis not present

## 2022-04-24 DIAGNOSIS — R262 Difficulty in walking, not elsewhere classified: Secondary | ICD-10-CM | POA: Diagnosis not present

## 2022-04-24 NOTE — Therapy (Signed)
OUTPATIENT PHYSICAL THERAPY TREATMENT NOTE   Patient Name: Robert Mcguire MRN: [id number] DOB:07/23/1960, 61 y.o., male Today's Date: 04/24/2022  PCP: [name], MD REFERRING PROVIDER: Garrel Ridgel, DPM   PT End of Session - 04/24/22 1103     Visit Number 5    Number of Visits 17    Date for PT Re-Evaluation 05/29/22    Authorization Type 5    Authorization Time Period of 10 progress report    PT Start Time 1103    PT Stop Time 0630    PT Time Calculation (min) 41 min    Activity Tolerance Patient tolerated treatment well    Behavior During Therapy WFL for tasks assessed/performed                Past Medical History:  Diagnosis Date   Allergic rhinitis    Allergy    Arthritis    Diabetes mellitus without complication ([place])    ED (erectile dysfunction)    GERD (gastroesophageal reflux disease)    Glaucoma    High cholesterol    Hyperlipidemia    Hypertension    Impaired glucose tolerance    Nephrolithiasis    Obesity    Past Surgical History:  Procedure Laterality Date   COLONOSCOPY     difficultity with intabation     GYNECOMASTIA EXCISION Bilateral    POLYPECTOMY     right achilles tendon surgery     Patient Active Problem List   Diagnosis Date Noted   Bradycardia 04/08/2022   Elevated liver enzymes 01/30/2022   Other fatigue 01/28/2022   SOB (shortness of breath) on exertion 01/28/2022   Diabetes mellitus ([place]) 01/28/2022   Mixed hyperlipidemia 01/28/2022   Health care maintenance 01/28/2022   Vitamin D deficiency 01/28/2022   Class 2 severe obesity with serious comorbidity and body mass index (BMI) of 36.0 to 36.9 in adult ([place]) 01/28/2022   Type 2 diabetes mellitus without complication, without long-term current use of insulin ([place]) 08/20/2016   Hx of colonic polyps 09/30/2011   GYNECOMASTIA 01/15/2010   NEPHROLITHIASIS, HX OF 05/04/2008   SHOULDER PAIN, LEFT 11/08/2007   Obesity 01/22/2007   ERECTILE DYSFUNCTION 01/22/2007    Essential hypertension 01/22/2007   ALLERGIC RHINITIS, SEASONAL 01/22/2007   GERD 01/22/2007   HYPOKALEMIA, HX OF 01/22/2007    REFERRING DIAG: M76.62 (ICD-10-CM) - Achilles tendinitis of left lower extremity   THERAPY DIAG:  Pain in left foot  Difficulty in walking, not elsewhere classified  Rationale for Evaluation and Treatment Rehabilitation  PERTINENT HISTORY: L Achilles Tendinitis. Pain began about 6 months ago, sudden onset, unknown mechanism of injury. Pt has R achilles surgery about 12 years ago to remove the bone spur. L achilles feels similar. Had an MRI which did not reveal a tear. PLOF: walks a few miles about 3-4 times a week and pain level when walking is about a 2-3/10. Has a CAM boot and wears it at home and grocery store. Does not wear CAM boot when doing his walking and gym routine. Pain is about the same since onset.  PRECAUTIONS: No known precautions   SUBJECTIVE: L Achilles Feels a little sore today. 6/10 soreness when walking. Was taking Mobic last week and felt really good. Went walking for about 2 miles afterwards then soreness returned. 1/10 soreness when not putting weight on it.  Not as much soreness when he tiptoes compared to regular walking ( foot flat to push-off phase of gait).  PAIN:  Are you having pain? 6/10 when walking from waiting room to treatment room.      TODAY'S TREATMENT:       Therapeutic exercise Standing B gastroc stretch with B UE assist at first regular step 30 seconds, then 1 minute x 2   Decreased Achilles soreness during gait   Then targeting B soleus 30 seconds for 3 sets   Static mini lunge with contralateral UE assist  L 10x3  R 10x3  SLS with contralateral foot on furniture slider  Circles clockwise and counter clockwise   R 10x3 (standing on L foot)  Forward and backward   R 10x3 each direction    Standing eccentric B heel lowering at first stair step with B UE assist 10x3  Decreased soreness   Standing  B ankle PF/DF on rocker board with B UE assist 2 minutes  SLS L LE without UE assist (light touch assist PRN) 30 seconds x 3    Improved exercise technique, movement at target joints, use of target muscles after mod verbal, visual, tactile cues.       Response to treatment Pt tolerated session well without aggravation of symptoms. Decreased L Achilles soreness to 1/10 after session during gait.          Clinical impression Decreased L Achilles soreness with treatment to improve gastroc flexibility as well as with isometric, concentric and eccentric, stress to affected tendon to promote proper healing. Decreased soreness to 1/10 after session. Pt will benefit from continued skilled physical therapy services to decrease pain, improve strength and function.     PATIENT EDUCATION: Education details: there-ex, HEP Person educated: Patient Education method: Explanation, Demonstration, Tactile cues, Verbal cues, and Handouts Education comprehension: verbalized understanding and returned demonstration   HOME EXERCISE PROGRAM: Medbridge Access Code: [email or web address] URL: [email or web address] Date: 04/08/2022 Prepared by: Joneen Boers  Exercises - Standing Heel Raise with Support  - 1 x daily - 7 x weekly - 3 sets - 10 reps - Mini Squat with Counter Support  - 1 x daily - 7 x weekly - 3 sets - 10 reps - Standing Bilateral Gastroc Stretch with Step  - 2 x daily - 7 x weekly - 1 sets - 3 reps - 1 minute hold Standing eccentric B heel lowering at first stair step with B UE assist 10x3  SLS L LE without UE assist (light touch assist PRN) 30 seconds x 3     PT Short Term Goals - 04/02/22 1252       PT SHORT TERM GOAL #1   Title Pt will be independent with his initial HEP to decrease pain, improve ankle ROM, strength, function and improve comfort with gait.    Baseline Pt has not yet started his HEP (04/02/2022)    Time 3    Period Weeks    Status New    Target Date  04/24/22              PT Long Term Goals - 04/02/22 1253       PT LONG TERM GOAL #1   Title Pt will have a decrease in L Achilles pain to 3/10 or less at worst to promote ability to ambulate first thing in the morning as well as after sitting for prolonged periods more comfortably.    Baseline 8/10 L Achilles pain at worst for the past 3 months (04/02/2022)    Time 8    Period Weeks    Status New  Target Date 05/29/22      PT LONG TERM GOAL #2   Title Pt will improve his FOTO score by at least 10 points as a demonstration of improved function.    Baseline L foot FOTO 63 (04/02/2022)    Time 8    Period Weeks    Status New    Target Date 05/29/22      PT LONG TERM GOAL #3   Title Pt will improve L ankle DF AROM to at least 15 degrees to promote ability to ambulate more comfortably after prolonged sitting and first thing in the morning.    Baseline L ankle DF AROM 1 degree (04/02/2022)    Time 8    Period Weeks    Status New    Target Date 05/29/22      PT LONG TERM GOAL #4   Title Pt will improve B hip extension strength by at least 1/2 MMT to promote ability to ambulate more comfortably.    Baseline Hip extension R 3+/5, L 4-/5 (04/02/2022)    Time 8    Period Weeks    Status New    Target Date 05/29/22              Plan - 04/24/22 1103     Clinical Impression Statement Decreased L Achilles soreness with treatment to improve gastroc flexibility as well as with isometric, concentric and eccentric, stress to affected tendon to promote proper healing. Decreased soreness to 1/10 after session. Pt will benefit from continued skilled physical therapy services to decrease pain, improve strength and function.    Personal Factors and Comorbidities Comorbidity 2;Past/Current Experience;Time since onset of injury/illness/exacerbation;Fitness    Comorbidities DM, HTN,    Examination-Activity Limitations Locomotion Level;Stairs;Carry    Stability/Clinical Decision Making  Stable/Uncomplicated    Rehab Potential Fair    PT Frequency 2x / week    PT Duration 8 weeks    PT Treatment/Interventions Manual techniques;Neuromuscular re-education;Therapeutic exercise;Therapeutic activities;Gait training;Stair training;Functional mobility training;Balance training;Patient/family education;Dry needling;Electrical Stimulation;Iontophoresis '4mg'$ /ml Dexamethasone    PT Next Visit Plan L ankle ROM, hip strengthening, manual techniques, modalities PRN    PT Home Exercise Plan Medbridge Access Code: [email or web address]    Consulted and Agree with Plan of Care Patient               Joneen Boers PT, DPT  04/24/2022, 11:46 AM

## 2022-04-28 ENCOUNTER — Ambulatory Visit: Payer: 59

## 2022-04-28 DIAGNOSIS — R262 Difficulty in walking, not elsewhere classified: Secondary | ICD-10-CM | POA: Diagnosis not present

## 2022-04-28 DIAGNOSIS — M79672 Pain in left foot: Secondary | ICD-10-CM

## 2022-04-28 NOTE — Therapy (Signed)
OUTPATIENT PHYSICAL THERAPY TREATMENT NOTE   Patient Name: Robert Mcguire MRN: [id number] DOB:Jan 10, 1961, 61 y.o., male Today's Date: 04/28/2022  PCP: [name], MD REFERRING PROVIDER: Garrel Ridgel, DPM   PT End of Session - 04/28/22 1150     Visit Number 6    Number of Visits 17    Date for PT Re-Evaluation 05/29/22    Authorization Type 6    Authorization Time Period of 10 progress report    PT Start Time 1151    PT Stop Time 1230    PT Time Calculation (min) 39 min    Activity Tolerance Patient tolerated treatment well    Behavior During Therapy WFL for tasks assessed/performed                 Past Medical History:  Diagnosis Date   Allergic rhinitis    Allergy    Arthritis    Diabetes mellitus without complication ([place])    ED (erectile dysfunction)    GERD (gastroesophageal reflux disease)    Glaucoma    High cholesterol    Hyperlipidemia    Hypertension    Impaired glucose tolerance    Nephrolithiasis    Obesity    Past Surgical History:  Procedure Laterality Date   COLONOSCOPY     difficultity with intabation     GYNECOMASTIA EXCISION Bilateral    POLYPECTOMY     right achilles tendon surgery     Patient Active Problem List   Diagnosis Date Noted   Bradycardia 04/08/2022   Elevated liver enzymes 01/30/2022   Other fatigue 01/28/2022   SOB (shortness of breath) on exertion 01/28/2022   Diabetes mellitus ([place]) 01/28/2022   Mixed hyperlipidemia 01/28/2022   Health care maintenance 01/28/2022   Vitamin D deficiency 01/28/2022   Class 2 severe obesity with serious comorbidity and body mass index (BMI) of 36.0 to 36.9 in adult ([place]) 01/28/2022   Type 2 diabetes mellitus without complication, without long-term current use of insulin ([place]) 08/20/2016   Hx of colonic polyps 09/30/2011   GYNECOMASTIA 01/15/2010   NEPHROLITHIASIS, HX OF 05/04/2008   SHOULDER PAIN, LEFT 11/08/2007   Obesity 01/22/2007   ERECTILE DYSFUNCTION 01/22/2007    Essential hypertension 01/22/2007   ALLERGIC RHINITIS, SEASONAL 01/22/2007   GERD 01/22/2007   HYPOKALEMIA, HX OF 01/22/2007    REFERRING DIAG: M76.62 (ICD-10-CM) - Achilles tendinitis of left lower extremity   THERAPY DIAG:  Pain in left foot  Difficulty in walking, not elsewhere classified  Rationale for Evaluation and Treatment Rehabilitation  PERTINENT HISTORY: L Achilles Tendinitis. Pain began about 6 months ago, sudden onset, unknown mechanism of injury. Pt has R achilles surgery about 12 years ago to remove the bone spur. L achilles feels similar. Had an MRI which did not reveal a tear. PLOF: walks a few miles about 3-4 times a week and pain level when walking is about a 2-3/10. Has a CAM boot and wears it at home and grocery store. Does not wear CAM boot when doing his walking and gym routine. Pain is about the same since onset.  PRECAUTIONS: No known precautions   SUBJECTIVE: L Achilles is feeling ok. Initial walking, pt feels his pain. Decreases when he moves around.     PAIN:  Are you having pain? 2/10 when walking from waiting room to treatment room.      TODAY'S TREATMENT:       Therapeutic exercise   Standing B ankle PF/DF on rocker board with B  UE assist 2 minutes  Static mini lunge with contralateral UE assist  L 10x3  R 10x3  Standing eccentric B heel lowering at first stair step with B UE assist 10x3    Standing B gastroc stretch with B UE assist at first regular step 1 minute x 3     SLS with contralateral foot on furniture slider  Circles clockwise and counter clockwise   R 10x3 (standing on L foot)  Forward and backward   R 10x3 each direction    SLS L LE without UE assist (light touch assist PRN) 30 seconds x 3  Standing B soleus stretch with B UE assist at first regular step 30 seconds x 3                         Prone glute max extension  L 10x2, then 5x  R 10x2, then 5x     Improved exercise technique, movement at target  joints, use of target muscles after mod verbal, visual, tactile cues.       Response to treatment Pt tolerated session well without aggravation of symptoms.            Clinical impression Continued working on improving gastroc and soleus mobility, concentric, isometric and eccentric strength, to promote proper stress to Achilles tendon to promote healing. Pt tolerated session without aggravation of symptoms. Pt will benefit from continued skilled physical therapy services to decrease pain, improve strength and function.     PATIENT EDUCATION: Education details: there-ex, HEP Person educated: Patient Education method: Explanation, Demonstration, Tactile cues, Verbal cues, and Handouts Education comprehension: verbalized understanding and returned demonstration   HOME EXERCISE PROGRAM: Medbridge Access Code: [email or web address] URL: [email or web address] Date: 04/08/2022 Prepared by: Joneen Boers  Exercises - Standing Heel Raise with Support  - 1 x daily - 7 x weekly - 3 sets - 10 reps - Mini Squat with Counter Support  - 1 x daily - 7 x weekly - 3 sets - 10 reps - Standing Bilateral Gastroc Stretch with Step  - 2 x daily - 7 x weekly - 1 sets - 3 reps - 1 minute hold Standing eccentric B heel lowering at first stair step with B UE assist 10x3  SLS L LE without UE assist (light touch assist PRN) 30 seconds x 3     PT Short Term Goals - 04/02/22 1252       PT SHORT TERM GOAL #1   Title Pt will be independent with his initial HEP to decrease pain, improve ankle ROM, strength, function and improve comfort with gait.    Baseline Pt has not yet started his HEP (04/02/2022)    Time 3    Period Weeks    Status New    Target Date 04/24/22              PT Long Term Goals - 04/02/22 1253       PT LONG TERM GOAL #1   Title Pt will have a decrease in L Achilles pain to 3/10 or less at worst to promote ability to ambulate first thing in the morning as well as after  sitting for prolonged periods more comfortably.    Baseline 8/10 L Achilles pain at worst for the past 3 months (04/02/2022)    Time 8    Period Weeks    Status New    Target Date 05/29/22      PT LONG TERM GOAL #2  Title Pt will improve his FOTO score by at least 10 points as a demonstration of improved function.    Baseline L foot FOTO 63 (04/02/2022)    Time 8    Period Weeks    Status New    Target Date 05/29/22      PT LONG TERM GOAL #3   Title Pt will improve L ankle DF AROM to at least 15 degrees to promote ability to ambulate more comfortably after prolonged sitting and first thing in the morning.    Baseline L ankle DF AROM 1 degree (04/02/2022)    Time 8    Period Weeks    Status New    Target Date 05/29/22      PT LONG TERM GOAL #4   Title Pt will improve B hip extension strength by at least 1/2 MMT to promote ability to ambulate more comfortably.    Baseline Hip extension R 3+/5, L 4-/5 (04/02/2022)    Time 8    Period Weeks    Status New    Target Date 05/29/22              Plan - 04/28/22 1147     Clinical Impression Statement Continued working on improving gastroc and soleus mobility, concentric, isometric and eccentric strength, to promote proper stress to Achilles tendon to promote healing. Pt tolerated session without aggravation of symptoms. Pt will benefit from continued skilled physical therapy services to decrease pain, improve strength and function.    Personal Factors and Comorbidities Comorbidity 2;Past/Current Experience;Time since onset of injury/illness/exacerbation;Fitness    Comorbidities DM, HTN,    Examination-Activity Limitations Locomotion Level;Stairs;Carry    Stability/Clinical Decision Making Stable/Uncomplicated    Rehab Potential Fair    PT Frequency 2x / week    PT Duration 8 weeks    PT Treatment/Interventions Manual techniques;Neuromuscular re-education;Therapeutic exercise;Therapeutic activities;Gait training;Stair  training;Functional mobility training;Balance training;Patient/family education;Dry needling;Electrical Stimulation;Iontophoresis '4mg'$ /ml Dexamethasone    PT Next Visit Plan L ankle ROM, hip strengthening, manual techniques, modalities PRN    PT Home Exercise Plan Medbridge Access Code: [email or web address]    Consulted and Agree with Plan of Care Patient               Joneen Boers PT, DPT  04/28/2022, 12:53 PM

## 2022-05-01 ENCOUNTER — Ambulatory Visit: Payer: 59

## 2022-05-01 DIAGNOSIS — R262 Difficulty in walking, not elsewhere classified: Secondary | ICD-10-CM

## 2022-05-01 DIAGNOSIS — M79672 Pain in left foot: Secondary | ICD-10-CM | POA: Diagnosis not present

## 2022-05-01 NOTE — Therapy (Signed)
OUTPATIENT PHYSICAL THERAPY TREATMENT NOTE   Patient Name: Robert Mcguire MRN: [id number] DOB:March 31, 1961, 61 y.o., male Today's Date: 05/01/2022  PCP: [name], MD REFERRING PROVIDER: Garrel Ridgel, DPM   PT End of Session - 05/01/22 1543     Visit Number 7    Number of Visits 17    Date for PT Re-Evaluation 05/29/22    Authorization Type 7    Authorization Time Period of 10 progress report    PT Start Time 5852    PT Stop Time 1634    PT Time Calculation (min) 49 min    Activity Tolerance Patient tolerated treatment well    Behavior During Therapy WFL for tasks assessed/performed                  Past Medical History:  Diagnosis Date   Allergic rhinitis    Allergy    Arthritis    Diabetes mellitus without complication ([place])    ED (erectile dysfunction)    GERD (gastroesophageal reflux disease)    Glaucoma    High cholesterol    Hyperlipidemia    Hypertension    Impaired glucose tolerance    Nephrolithiasis    Obesity    Past Surgical History:  Procedure Laterality Date   COLONOSCOPY     difficultity with intabation     GYNECOMASTIA EXCISION Bilateral    POLYPECTOMY     right achilles tendon surgery     Patient Active Problem List   Diagnosis Date Noted   Bradycardia 04/08/2022   Elevated liver enzymes 01/30/2022   Other fatigue 01/28/2022   SOB (shortness of breath) on exertion 01/28/2022   Diabetes mellitus ([place]) 01/28/2022   Mixed hyperlipidemia 01/28/2022   Health care maintenance 01/28/2022   Vitamin D deficiency 01/28/2022   Class 2 severe obesity with serious comorbidity and body mass index (BMI) of 36.0 to 36.9 in adult ([place]) 01/28/2022   Type 2 diabetes mellitus without complication, without long-term current use of insulin ([place]) 08/20/2016   Hx of colonic polyps 09/30/2011   GYNECOMASTIA 01/15/2010   NEPHROLITHIASIS, HX OF 05/04/2008   SHOULDER PAIN, LEFT 11/08/2007   Obesity 01/22/2007   ERECTILE DYSFUNCTION 01/22/2007    Essential hypertension 01/22/2007   ALLERGIC RHINITIS, SEASONAL 01/22/2007   GERD 01/22/2007   HYPOKALEMIA, HX OF 01/22/2007    REFERRING DIAG: M76.62 (ICD-10-CM) - Achilles tendinitis of left lower extremity   THERAPY DIAG:  Pain in left foot  Difficulty in walking, not elsewhere classified  Rationale for Evaluation and Treatment Rehabilitation  PERTINENT HISTORY: L Achilles Tendinitis. Pain began about 6 months ago, sudden onset, unknown mechanism of injury. Pt has R achilles surgery about 12 years ago to remove the bone spur. L achilles feels similar. Had an MRI which did not reveal a tear. PLOF: walks a few miles about 3-4 times a week and pain level when walking is about a 2-3/10. Has a CAM boot and wears it at home and grocery store. Does not wear CAM boot when doing his walking and gym routine. Pain is about the same since onset.  PRECAUTIONS: No known precautions   SUBJECTIVE: Went walking for a couple of miles yesterday on level concrete surface and it bothered it really good. Did stretches before and after but was flared up after the walk (7-8/10). Used the boot afterwards. Better today. Not too bad after last session.     PAIN:  Are you having pain? 6/10 when walking from waiting  room to treatment room.      TODAY'S TREATMENT:   Manual therapy   Prone L ankle distraction with EV to calcaneus to promote more neutral position.   Prone STM L medial, posterior and lateral calcaneus to improve fascial mobility  Prone STM medial and lateral gastroc muscles to decrease tension       Therapeutic exercise  Prone manually resisted L ankle EV with ankle distraction and PT assist calcaneal EV 10x5 seconds for 3 sets  Decreased L calcaneal IV and more neutral position observed and palpated.   Prone manually resisted eccentric L ankle PF with PT assist with L calcaneal EV and distraction 10x5 seconds   Seated L ankle EV 10x3 with 5 second holds      Improved  exercise technique, movement at target joints, use of target muscles after mod verbal, visual, tactile cues.        Response to treatment Pt tolerated session well without aggravation of symptoms. 5/10 L Achilles pain during gait ater session.            Clinical impression Worked on decreasing excessive L calcaneal IV to decrease excessive stress to lateral Achilles attachment when ambulating. Continued working on improving fascial mobility and decreasing gastroc muscle tension to promote movement and improve mechanics at distal Achilles tendon attachment. Pt tolerated session well without aggravation of symptoms. Pt will benefit from continued skilled physical therapy services to decrease pain, improve strength and function.     PATIENT EDUCATION: Education details: there-ex, HEP Person educated: Patient Education method: Explanation, Demonstration, Tactile cues, Verbal cues, and Handouts Education comprehension: verbalized understanding and returned demonstration   HOME EXERCISE PROGRAM: Medbridge Access Code: [email or web address] URL: [email or web address] Date: 04/08/2022 Prepared by: Joneen Boers  Exercises - Standing Heel Raise with Support  - 1 x daily - 7 x weekly - 3 sets - 10 reps - Mini Squat with Counter Support  - 1 x daily - 7 x weekly - 3 sets - 10 reps - Standing Bilateral Gastroc Stretch with Step  - 2 x daily - 7 x weekly - 1 sets - 3 reps - 1 minute hold Standing eccentric B heel lowering at first stair step with B UE assist 10x3  SLS L LE without UE assist (light touch assist PRN) 30 seconds x 3     PT Short Term Goals - 04/02/22 1252       PT SHORT TERM GOAL #1   Title Pt will be independent with his initial HEP to decrease pain, improve ankle ROM, strength, function and improve comfort with gait.    Baseline Pt has not yet started his HEP (04/02/2022)    Time 3    Period Weeks    Status New    Target Date 04/24/22              PT  Long Term Goals - 04/02/22 1253       PT LONG TERM GOAL #1   Title Pt will have a decrease in L Achilles pain to 3/10 or less at worst to promote ability to ambulate first thing in the morning as well as after sitting for prolonged periods more comfortably.    Baseline 8/10 L Achilles pain at worst for the past 3 months (04/02/2022)    Time 8    Period Weeks    Status New    Target Date 05/29/22      PT LONG TERM GOAL #2   Title Pt will improve his  FOTO score by at least 10 points as a demonstration of improved function.    Baseline L foot FOTO 63 (04/02/2022)    Time 8    Period Weeks    Status New    Target Date 05/29/22      PT LONG TERM GOAL #3   Title Pt will improve L ankle DF AROM to at least 15 degrees to promote ability to ambulate more comfortably after prolonged sitting and first thing in the morning.    Baseline L ankle DF AROM 1 degree (04/02/2022)    Time 8    Period Weeks    Status New    Target Date 05/29/22      PT LONG TERM GOAL #4   Title Pt will improve B hip extension strength by at least 1/2 MMT to promote ability to ambulate more comfortably.    Baseline Hip extension R 3+/5, L 4-/5 (04/02/2022)    Time 8    Period Weeks    Status New    Target Date 05/29/22              Plan - 05/01/22 1539     Clinical Impression Statement Worked on decreasing excessive L calcaneal IV to decrease excessive stress to lateral Achilles attachment when ambulating. Continued working on improving fascial mobility and decreasing gastroc muscle tension to promote movement and improve mechanics at distal Achilles tendon attachment. Pt tolerated session well without aggravation of symptoms. Pt will benefit from continued skilled physical therapy services to decrease pain, improve strength and function.    Personal Factors and Comorbidities Comorbidity 2;Past/Current Experience;Time since onset of injury/illness/exacerbation;Fitness    Comorbidities DM, HTN,     Examination-Activity Limitations Locomotion Level;Stairs;Carry    Stability/Clinical Decision Making Stable/Uncomplicated    Rehab Potential Fair    PT Frequency 2x / week    PT Duration 8 weeks    PT Treatment/Interventions Manual techniques;Neuromuscular re-education;Therapeutic exercise;Therapeutic activities;Gait training;Stair training;Functional mobility training;Balance training;Patient/family education;Dry needling;Electrical Stimulation;Iontophoresis '4mg'$ /ml Dexamethasone    PT Next Visit Plan L ankle ROM, hip strengthening, manual techniques, modalities PRN    PT Home Exercise Plan Medbridge Access Code: [email or web address]    Consulted and Agree with Plan of Care Patient               Joneen Boers PT, DPT  05/01/2022, 4:43 PM

## 2022-05-05 ENCOUNTER — Ambulatory Visit: Payer: 59

## 2022-05-05 ENCOUNTER — Encounter: Payer: Self-pay | Admitting: Cardiovascular Disease

## 2022-05-05 ENCOUNTER — Ambulatory Visit: Payer: 59 | Attending: Cardiovascular Disease | Admitting: Cardiovascular Disease

## 2022-05-05 VITALS — BP 128/72 | HR 60 | Ht 67.0 in | Wt 222.8 lb

## 2022-05-05 DIAGNOSIS — M79672 Pain in left foot: Secondary | ICD-10-CM

## 2022-05-05 DIAGNOSIS — R262 Difficulty in walking, not elsewhere classified: Secondary | ICD-10-CM

## 2022-05-05 DIAGNOSIS — R001 Bradycardia, unspecified: Secondary | ICD-10-CM | POA: Diagnosis not present

## 2022-05-05 NOTE — Progress Notes (Signed)
Chief Complaint  Patient presents with   New Patient (Initial Visit)    Bradycardia   History of Present Illness: 61 yo male with history of DM, GERD, HTN, HLD and obesity who is here today as a new consult, referred by Dr. Leafy Ro, for the evaluation of bradycardia. He has long standing bradycardia. EKG from July 2023 with Sinus brady, heart rate of 56 bpm. He tells me today that he feels well. He has no chest pain, dyspnea, dizziness, fatigue, weakness, near syncope or syncope. No LE edema. He is very active. He plays golf and work out. He is in a weight loss program and has lost 30 lbs over the past few months. His Apple Watch shows that his heart is in the 50s at night. EKG in 2009 with HR of 60 bpm.   Primary Care Physician: [name], MD  Referring: Leafy Ro  Past Medical History:  Diagnosis Date   Allergic rhinitis    Allergy    Arthritis    Diabetes mellitus without complication [facility])    ED (erectile dysfunction)    GERD (gastroesophageal reflux disease)    Glaucoma    High cholesterol    Hyperlipidemia    Hypertension    Impaired glucose tolerance    Nephrolithiasis    Obesity     Past Surgical History:  Procedure Laterality Date   COLONOSCOPY     difficultity with intabation     GYNECOMASTIA EXCISION Bilateral    POLYPECTOMY     right achilles tendon surgery      Current Outpatient Medications  Medication Sig Dispense Refill   alfuzosin (UROXATRAL) 10 MG 24 hr tablet Take 10 mg by mouth daily.     amLODipine (NORVASC) 10 MG tablet TAKE 1 TABLET BY MOUTH EVERY DAY 90 tablet 1   atorvastatin (LIPITOR) 40 MG tablet Take 1 tablet (40 mg total) by mouth daily. 90 tablet 3   benazepril (LOTENSIN) 40 MG tablet Take 1 tablet (40 mg total) by mouth daily. 90 tablet 3   Cyanocobalamin (VITAMIN B-12 PO) Take by mouth.     ELDERBERRY PO Take by mouth.     empagliflozin (JARDIANCE) 25 MG TABS tablet Take 1 tablet (25 mg total) by mouth daily. 90 tablet 3    latanoprost (XALATAN) 0.005 % ophthalmic solution      metoprolol succinate (TOPROL-XL) 100 MG 24 hr tablet Take with or immediately following a meal. 90 tablet 3   MOBIC 15 MG tablet Take 1 tablet (15 mg total) by mouth daily. 30 tablet 3   Multiple Vitamin (MULTIVITAMIN ADULT PO) Take by mouth.     sildenafil (VIAGRA) 100 MG tablet Take 1 tablet (100 mg total) by mouth daily as needed for erectile dysfunction. 30 tablet 6   tadalafil (CIALIS) 5 MG tablet Take 5 mg by mouth daily as needed for erectile dysfunction.     timolol (TIMOPTIC) 0.5 % ophthalmic solution 1 drop daily.     VITAMIN D PO Take by mouth.     No current facility-administered medications for this visit.    No Known Allergies  Social History   Socioeconomic History   Marital status: Married    Spouse name: Not on file   Number of children: 2   Years of education: Not on file   Highest education level: Not on file  Occupational History   Occupation: Retired supply Nature conservation officer  Tobacco Use   Smoking status: Never   Smokeless tobacco: Never  Substance and Sexual Activity   Alcohol use: Yes    Alcohol/week: 0.0 standard drinks of alcohol    Comment: occasionally   Drug use: No   Sexual activity: Not on file  Other Topics Concern   Not on file  Social History Narrative   Not on file   Social Determinants of Health   Financial Resource Strain: Not on file  Food Insecurity: Not on file  Transportation Needs: Not on file  Physical Activity: Not on file  Stress: Not on file  Social Connections: Not on file  Intimate Partner Violence: Not on file    Family History  Problem Relation Age of Onset   Hypertension Mother    Heart disease Mother        Pacemaker   Obesity Father    Diabetes Father    Hypertension Father    Colon cancer Father 69   Colon polyps Father    High Cholesterol Father    Cancer Father    Healthy Sister    Hypertension Brother    Colon polyps Brother    Hypertension  Brother    Hypertension Maternal Grandmother    Diabetes Other    Hypertension Other    Breast cancer Other        breast, colon    Stomach cancer Other        paternal great grand father   Esophageal cancer Neg Hx    Rectal cancer Neg Hx     Review of Systems:  As stated in the HPI and otherwise negative.   BP 128/72   Pulse 60   Ht '5\' 7"'$  (1.702 m)   Wt 222 lb 12.8 oz (101.1 kg)   SpO2 97%   BMI 34.90 kg/m   Physical Examination: General: Well developed, well nourished, NAD  HEENT: OP clear, mucus membranes moist  SKIN: warm, dry. No rashes. Neuro: No focal deficits  Musculoskeletal: Muscle strength 5/5 all ext  Psychiatric: Mood and affect normal  Neck: No JVD, no carotid bruits, no thyromegaly, no lymphadenopathy.  Lungs:Clear bilaterally, no wheezes, rhonci, crackles Cardiovascular: Regular rate and rhythm. No murmurs, gallops or rubs. Abdomen:Soft. Bowel sounds present. Non-tender.  Extremities: No lower extremity edema. Pulses are 2 + in the bilateral DP/PT.  EKG:  EKG is ordered today. The ekg ordered today demonstrates Sinus, 1st degree AV block.  Poor R wave progression.   Recent Labs: 09/30/2021: Hemoglobin 16.3; Platelets 162.0 01/29/2022: ALT 60; BUN 19; Creatinine, Ser 0.81; Potassium 4.4; Sodium 138; TSH 1.810   Lipid Panel    Component Value Date/Time   CHOL 127 01/29/2022 0815   TRIG 63 01/29/2022 0815   HDL 44 01/29/2022 0815   CHOLHDL 3 09/30/2021 1020   VLDL 17.2 09/30/2021 1020   LDLCALC 70 01/29/2022 0815   LDLDIRECT 168.0 05/13/2016 0810     Wt Readings from Last 3 Encounters:  05/05/22 222 lb 12.8 oz (101.1 kg)  04/08/22 219 lb (99.3 kg)  03/12/22 223 lb (101.2 kg)    Assessment and Plan:   1. Sinus bradycardia: He has no dizziness or near syncope. He is on a high dose of metoprolol which is lowering his heart rate. Will arrange an echo to assess LVEF and exclude structural heart disease. I do not think a cardiac monitor is indicated  since he is asymptomatic.   Labs/ tests ordered today include:   Orders Placed This Encounter  Procedures   EKG 12-Lead   ECHOCARDIOGRAM COMPLETE   Disposition:  F/U with me in two years  Signed, Lauree Chandler, MD, [facility] 05/05/2022 10:36 AM    [address], [place], [place]  [address] Phone: [phone]; Fax: [phone]

## 2022-05-05 NOTE — Therapy (Signed)
OUTPATIENT PHYSICAL THERAPY TREATMENT NOTE   Patient Name: Robert Mcguire MRN: [id number] DOB:01-20-1961, 61 y.o., male Today's Date: 05/05/2022  PCP: [name], MD REFERRING PROVIDER: Garrel Ridgel, DPM   PT End of Session - 05/05/22 1418     Visit Number 8    Number of Visits 17    Date for PT Re-Evaluation 05/29/22    Authorization Type 8    Authorization Time Period of 10 progress report    PT Start Time 1418    PT Stop Time 1500    PT Time Calculation (min) 42 min    Activity Tolerance Patient tolerated treatment well    Behavior During Therapy WFL for tasks assessed/performed                   Past Medical History:  Diagnosis Date   Allergic rhinitis    Allergy    Arthritis    Diabetes mellitus without complication ([place])    ED (erectile dysfunction)    GERD (gastroesophageal reflux disease)    Glaucoma    High cholesterol    Hyperlipidemia    Hypertension    Impaired glucose tolerance    Nephrolithiasis    Obesity    Past Surgical History:  Procedure Laterality Date   COLONOSCOPY     difficultity with intabation     GYNECOMASTIA EXCISION Bilateral    POLYPECTOMY     right achilles tendon surgery     Patient Active Problem List   Diagnosis Date Noted   Bradycardia 04/08/2022   Elevated liver enzymes 01/30/2022   Other fatigue 01/28/2022   SOB (shortness of breath) on exertion 01/28/2022   Diabetes mellitus ([place]) 01/28/2022   Mixed hyperlipidemia 01/28/2022   Health care maintenance 01/28/2022   Vitamin D deficiency 01/28/2022   Class 2 severe obesity with serious comorbidity and body mass index (BMI) of 36.0 to 36.9 in adult ([place]) 01/28/2022   Type 2 diabetes mellitus without complication, without long-term current use of insulin ([place]) 08/20/2016   Hx of colonic polyps 09/30/2011   GYNECOMASTIA 01/15/2010   NEPHROLITHIASIS, HX OF 05/04/2008   SHOULDER PAIN, LEFT 11/08/2007   Obesity 01/22/2007   ERECTILE DYSFUNCTION 01/22/2007    Essential hypertension 01/22/2007   ALLERGIC RHINITIS, SEASONAL 01/22/2007   GERD 01/22/2007   HYPOKALEMIA, HX OF 01/22/2007    REFERRING DIAG: M76.62 (ICD-10-CM) - Achilles tendinitis of left lower extremity   THERAPY DIAG:  Pain in left foot  Difficulty in walking, not elsewhere classified  Rationale for Evaluation and Treatment Rehabilitation  PERTINENT HISTORY: L Achilles Tendinitis. Pain began about 6 months ago, sudden onset, unknown mechanism of injury. Pt has R achilles surgery about 12 years ago to remove the bone spur. L achilles feels similar. Had an MRI which did not reveal a tear. PLOF: walks a few miles about 3-4 times a week and pain level when walking is about a 2-3/10. Has a CAM boot and wears it at home and grocery store. Does not wear CAM boot when doing his walking and gym routine. Pain is about the same since onset.  PRECAUTIONS: No known precautions   SUBJECTIVE: L Achilles is about the same. 2-3/10 currently. Initial movements after prolonged static positions, pain is about 8/10, then it eases to about a 2/10 with movement. Tries to move his foot around before he gets up.      PAIN:  Are you having pain? 6/10 when walking from waiting room to treatment room.  TODAY'S TREATMENT:   Manual therapy     With EDGE tool, STM to promote blood flow and decrease fascial restrictions L Achilles  Prone L ankle distraction with EV to calcaneus to promote more neutral position.        Therapeutic exercise  Standing eccentric heel lowering with B UE assist 10x  Ankle rocker board IV/EV position standing B UE assist 2 minutes  Ankle rocker board PF/DF position standing B UE assist 2 minutes  Static lunges   R 10x3  L 10x3  Side stepping 20 ft to the R and 20 ft to the L, yellow band around forefeet for 2 sets.      Improved exercise technique, movement at target joints, use of target muscles after mod verbal, visual, tactile cues.         Response to treatment Pt tolerated session well without aggravation of symptoms. Decreased pain reported after session.          Clinical impression  Utilized Edge tool to promote blood flow, improve fascial mobility to L Achilles and distal insertion. Continued with eccentric gastroc and soleus muscle strengthening to promote proper stress to tendon to promote healing. Decreased pain reported after session. Pt was recommended to massage his achilles and heel when he wakes up in the morning as well to help decrease stiffness from the night. Pt verbalized understanding. Pt will benefit from continued skilled physical therapy services to decrease pain, improve strength and function.     PATIENT EDUCATION: Education details: there-ex, HEP Person educated: Patient Education method: Explanation, Demonstration, Tactile cues, Verbal cues, and Handouts Education comprehension: verbalized understanding and returned demonstration   HOME EXERCISE PROGRAM: Medbridge Access Code: [email or web address] URL: [email or web address] Date: 04/08/2022 Prepared by: Joneen Boers  Exercises - Standing Heel Raise with Support  - 1 x daily - 7 x weekly - 3 sets - 10 reps - Mini Squat with Counter Support  - 1 x daily - 7 x weekly - 3 sets - 10 reps - Standing Bilateral Gastroc Stretch with Step  - 2 x daily - 7 x weekly - 1 sets - 3 reps - 1 minute hold Standing eccentric B heel lowering at first stair step with B UE assist 10x3  SLS L LE without UE assist (light touch assist PRN) 30 seconds x 3     PT Short Term Goals - 04/02/22 1252       PT SHORT TERM GOAL #1   Title Pt will be independent with his initial HEP to decrease pain, improve ankle ROM, strength, function and improve comfort with gait.    Baseline Pt has not yet started his HEP (04/02/2022)    Time 3    Period Weeks    Status New    Target Date 04/24/22              PT Long Term Goals - 04/02/22 1253       PT LONG TERM  GOAL #1   Title Pt will have a decrease in L Achilles pain to 3/10 or less at worst to promote ability to ambulate first thing in the morning as well as after sitting for prolonged periods more comfortably.    Baseline 8/10 L Achilles pain at worst for the past 3 months (04/02/2022)    Time 8    Period Weeks    Status New    Target Date 05/29/22      PT LONG TERM GOAL #2   Title Pt will improve  his FOTO score by at least 10 points as a demonstration of improved function.    Baseline L foot FOTO 63 (04/02/2022)    Time 8    Period Weeks    Status New    Target Date 05/29/22      PT LONG TERM GOAL #3   Title Pt will improve L ankle DF AROM to at least 15 degrees to promote ability to ambulate more comfortably after prolonged sitting and first thing in the morning.    Baseline L ankle DF AROM 1 degree (04/02/2022)    Time 8    Period Weeks    Status New    Target Date 05/29/22      PT LONG TERM GOAL #4   Title Pt will improve B hip extension strength by at least 1/2 MMT to promote ability to ambulate more comfortably.    Baseline Hip extension R 3+/5, L 4-/5 (04/02/2022)    Time 8    Period Weeks    Status New    Target Date 05/29/22              Plan - 05/05/22 1418     Clinical Impression Statement Utilized Edge tool to promote blood flow, improve fascial mobility to L Achilles and distal insertion. Continued with eccentric gastroc and soleus muscle strengthening to promote proper stress to tendon to promote healing. Decreased pain reported after session. Pt was recommended to massage his achilles and heel when he wakes up in the morning as well to help decrease stiffness from the night. Pt verbalized understanding. Pt will benefit from continued skilled physical therapy services to decrease pain, improve strength and function    Personal Factors and Comorbidities Comorbidity 2;Past/Current Experience;Time since onset of injury/illness/exacerbation;Fitness    Comorbidities DM,  HTN,    Examination-Activity Limitations Locomotion Level;Stairs;Carry    Stability/Clinical Decision Making Stable/Uncomplicated    Rehab Potential Fair    PT Frequency 2x / week    PT Duration 8 weeks    PT Treatment/Interventions Manual techniques;Neuromuscular re-education;Therapeutic exercise;Therapeutic activities;Gait training;Stair training;Functional mobility training;Balance training;Patient/family education;Dry needling;Electrical Stimulation;Iontophoresis '4mg'$ /ml Dexamethasone    PT Next Visit Plan L ankle ROM, hip strengthening, manual techniques, modalities PRN    PT Home Exercise Plan Medbridge Access Code: [email or web address]    Consulted and Agree with Plan of Care Patient                Joneen Boers PT, DPT  05/05/2022, 3:25 PM

## 2022-05-05 NOTE — Patient Instructions (Signed)
Medication Instructions:  No changes *If you need a refill on your cardiac medications before your next appointment, please call your pharmacy*   Lab Work: none  Testing/Procedures: Your physician has requested that you have an echocardiogram. Echocardiography is a painless test that uses sound waves to create images of your heart. It provides your doctor with information about the size and shape of your heart and how well your heart's chambers and valves are working. This procedure takes approximately one hour. There are no restrictions for this procedure. Please do NOT wear cologne, perfume, aftershave, or lotions (deodorant is allowed). Please arrive 15 minutes prior to your appointment time.   Follow-Up: At [facility], you and your health needs are our priority.  As part of our continuing mission to provide you with exceptional heart care, we have created designated Provider Care Teams.  These Care Teams include your primary Cardiologist (physician) and Advanced Practice Providers (APPs -  Physician Assistants and Nurse Practitioners) who all work together to provide you with the care you need, when you need it.   Your next appointment:   2 year(s)  The format for your next appointment:   In Person  Provider:   Lauree Chandler, MD      Important Information About Sugar

## 2022-05-06 ENCOUNTER — Encounter (INDEPENDENT_AMBULATORY_CARE_PROVIDER_SITE_OTHER): Payer: Self-pay | Admitting: Family Medicine

## 2022-05-06 ENCOUNTER — Ambulatory Visit (INDEPENDENT_AMBULATORY_CARE_PROVIDER_SITE_OTHER): Payer: 59 | Admitting: Family Medicine

## 2022-05-06 VITALS — BP 143/71 | HR 71 | Temp 98.5°F | Ht 67.0 in | Wt 219.0 lb

## 2022-05-06 DIAGNOSIS — E669 Obesity, unspecified: Secondary | ICD-10-CM | POA: Diagnosis not present

## 2022-05-06 DIAGNOSIS — I10 Essential (primary) hypertension: Secondary | ICD-10-CM

## 2022-05-06 DIAGNOSIS — E1169 Type 2 diabetes mellitus with other specified complication: Secondary | ICD-10-CM

## 2022-05-06 DIAGNOSIS — Z7984 Long term (current) use of oral hypoglycemic drugs: Secondary | ICD-10-CM

## 2022-05-06 DIAGNOSIS — Z6834 Body mass index (BMI) 34.0-34.9, adult: Secondary | ICD-10-CM | POA: Diagnosis not present

## 2022-05-08 ENCOUNTER — Ambulatory Visit: Payer: 59 | Attending: Podiatry

## 2022-05-08 DIAGNOSIS — R262 Difficulty in walking, not elsewhere classified: Secondary | ICD-10-CM | POA: Diagnosis not present

## 2022-05-08 DIAGNOSIS — M79672 Pain in left foot: Secondary | ICD-10-CM | POA: Diagnosis not present

## 2022-05-08 NOTE — Therapy (Signed)
OUTPATIENT PHYSICAL THERAPY TREATMENT NOTE   Patient Name: Robert Mcguire MRN: [id number] DOB:1961/03/15, 61 y.o., male Today's Date: 05/08/2022  PCP: [name], MD REFERRING PROVIDER: Garrel Ridgel, DPM   PT End of Session - 05/08/22 1201     Visit Number 9    Number of Visits 17    Date for PT Re-Evaluation 05/29/22    Authorization Type 8    Authorization Time Period of 10 progress report    PT Start Time 1200    PT Stop Time 1230    PT Time Calculation (min) 30 min    Activity Tolerance Patient tolerated treatment well    Behavior During Therapy WFL for tasks assessed/performed                   Past Medical History:  Diagnosis Date   Allergic rhinitis    Allergy    Arthritis    Diabetes mellitus without complication ([place])    ED (erectile dysfunction)    GERD (gastroesophageal reflux disease)    Glaucoma    High cholesterol    Hyperlipidemia    Hypertension    Impaired glucose tolerance    Nephrolithiasis    Obesity    Past Surgical History:  Procedure Laterality Date   COLONOSCOPY     difficultity with intabation     GYNECOMASTIA EXCISION Bilateral    POLYPECTOMY     right achilles tendon surgery     Patient Active Problem List   Diagnosis Date Noted   Bradycardia 04/08/2022   Elevated liver enzymes 01/30/2022   Other fatigue 01/28/2022   SOB (shortness of breath) on exertion 01/28/2022   Diabetes mellitus ([place]) 01/28/2022   Mixed hyperlipidemia 01/28/2022   Health care maintenance 01/28/2022   Vitamin D deficiency 01/28/2022   Class 2 severe obesity with serious comorbidity and body mass index (BMI) of 36.0 to 36.9 in adult ([place]) 01/28/2022   Type 2 diabetes mellitus without complication, without long-term current use of insulin ([place]) 08/20/2016   Hx of colonic polyps 09/30/2011   GYNECOMASTIA 01/15/2010   NEPHROLITHIASIS, HX OF 05/04/2008   SHOULDER PAIN, LEFT 11/08/2007   Obesity 01/22/2007   ERECTILE DYSFUNCTION 01/22/2007    Essential hypertension 01/22/2007   ALLERGIC RHINITIS, SEASONAL 01/22/2007   GERD 01/22/2007   HYPOKALEMIA, HX OF 01/22/2007    REFERRING DIAG: M76.62 (ICD-10-CM) - Achilles tendinitis of left lower extremity   THERAPY DIAG:  Pain in left foot  Difficulty in walking, not elsewhere classified  Rationale for Evaluation and Treatment Rehabilitation  PERTINENT HISTORY: L Achilles Tendinitis. Pain began about 6 months ago, sudden onset, unknown mechanism of injury. Pt has R achilles surgery about 12 years ago to remove the bone spur. L achilles feels similar. Had an MRI which did not reveal a tear. PLOF: walks a few miles about 3-4 times a week and pain level when walking is about a 2-3/10. Has a CAM boot and wears it at home and grocery store. Does not wear CAM boot when doing his walking and gym routine. Pain is about the same since onset.  PRECAUTIONS: No known precautions   SUBJECTIVE: Pt reports starting back on his Mobic and pain has been consistently 1/10 NPS. Improved pain after being sedentary with normal first steps too.      PAIN:  Are you having pain? 1/10 when walking from waiting room to treatment room.      TODAY'S TREATMENT:     There.ex:  LLE lunge for CKC ankle DF: x20, 2-3 sec holds   Heel raises: 3x15, Slow eccentric control asc BLE's, desc with LLE. Pain remains 1/10   SLS on airex pad: LLE, 4x30 sec. Notable frontal plane sway and unsteadiness with frequent need for light UE support. Performed on RLE, improved stability, no need for UE support. SBA throughout.   Education provided on HEP progressions with gym based equipment and use of body weight. Education provided on typical response to increased resistance and to update primary PT on today's heel raise progression.       Response to treatment Pt tolerated session well without aggravation of symptoms.          Clinical impression Overall session limited due to pt arriving 15 minutes late to  session. Due to less pain, progression of resistance exercise to LLE performed to promote tendon healing. Notable instability noted with SLS on LLE compared to RLE. Pt endorses HEP becoming easy at end of session. A few minutes spent at end of session on exercise progressions for SLS, heel raises to further progress in tendon healing and strength gains. May warrant basic progression next session to further make improvements towards pt's pain and goals in POC. Pt will benefit from continued skilled physical therapy services to decrease pain, improve strength and function.    PATIENT EDUCATION: Education details: there-ex, HEP Person educated: Patient Education method: Explanation, Demonstration, Tactile cues, Verbal cues, and Handouts Education comprehension: verbalized understanding and returned demonstration   HOME EXERCISE PROGRAM: Medbridge Access Code: [email or web address] URL: [email or web address] Date: 04/08/2022 Prepared by: Joneen Boers  Exercises - Standing Heel Raise with Support  - 1 x daily - 7 x weekly - 3 sets - 10 reps - Mini Squat with Counter Support  - 1 x daily - 7 x weekly - 3 sets - 10 reps - Standing Bilateral Gastroc Stretch with Step  - 2 x daily - 7 x weekly - 1 sets - 3 reps - 1 minute hold Standing eccentric B heel lowering at first stair step with B UE assist 10x3  SLS L LE without UE assist (light touch assist PRN) 30 seconds x 3     PT Short Term Goals - 04/02/22 1252       PT SHORT TERM GOAL #1   Title Pt will be independent with his initial HEP to decrease pain, improve ankle ROM, strength, function and improve comfort with gait.    Baseline Pt has not yet started his HEP (04/02/2022)    Time 3    Period Weeks    Status New    Target Date 04/24/22              PT Long Term Goals - 04/02/22 1253       PT LONG TERM GOAL #1   Title Pt will have a decrease in L Achilles pain to 3/10 or less at worst to promote ability to ambulate first  thing in the morning as well as after sitting for prolonged periods more comfortably.    Baseline 8/10 L Achilles pain at worst for the past 3 months (04/02/2022)    Time 8    Period Weeks    Status New    Target Date 05/29/22      PT LONG TERM GOAL #2   Title Pt will improve his FOTO score by at least 10 points as a demonstration of improved function.    Baseline L foot FOTO 63 (04/02/2022)    Time  8    Period Weeks    Status New    Target Date 05/29/22      PT LONG TERM GOAL #3   Title Pt will improve L ankle DF AROM to at least 15 degrees to promote ability to ambulate more comfortably after prolonged sitting and first thing in the morning.    Baseline L ankle DF AROM 1 degree (04/02/2022)    Time 8    Period Weeks    Status New    Target Date 05/29/22      PT LONG TERM GOAL #4   Title Pt will improve B hip extension strength by at least 1/2 MMT to promote ability to ambulate more comfortably.    Baseline Hip extension R 3+/5, L 4-/5 (04/02/2022)    Time 8    Period Weeks    Status New    Target Date 05/29/22               Salem Caster. Fairly IV, PT, DPT Physical Therapist- [facility]  05/08/2022, 12:33 PM

## 2022-05-14 DIAGNOSIS — L218 Other seborrheic dermatitis: Secondary | ICD-10-CM | POA: Diagnosis not present

## 2022-05-15 ENCOUNTER — Ambulatory Visit: Payer: 59

## 2022-05-15 DIAGNOSIS — R262 Difficulty in walking, not elsewhere classified: Secondary | ICD-10-CM

## 2022-05-15 DIAGNOSIS — M79672 Pain in left foot: Secondary | ICD-10-CM

## 2022-05-15 NOTE — Therapy (Signed)
OUTPATIENT PHYSICAL THERAPY TREATMENT NOTE And Progress Report   Patient Name: Robert Mcguire MRN: [id number] DOB:September 08, 1960, 61 y.o., male Today's Date: 05/15/2022  PCP: [name], MD REFERRING PROVIDER: Garrel Ridgel, DPM   PT End of Session - 05/15/22 1104     Visit Number 10    Number of Visits 17    Date for PT Re-Evaluation 05/29/22    Authorization Type 10    Authorization Time Period of 10 progress report    PT Start Time 1104    PT Stop Time 1146    PT Time Calculation (min) 42 min    Activity Tolerance Patient tolerated treatment well    Behavior During Therapy WFL for tasks assessed/performed                    Past Medical History:  Diagnosis Date   Allergic rhinitis    Allergy    Arthritis    Diabetes mellitus without complication ([place])    ED (erectile dysfunction)    GERD (gastroesophageal reflux disease)    Glaucoma    High cholesterol    Hyperlipidemia    Hypertension    Impaired glucose tolerance    Nephrolithiasis    Obesity    Past Surgical History:  Procedure Laterality Date   COLONOSCOPY     difficultity with intabation     GYNECOMASTIA EXCISION Bilateral    POLYPECTOMY     right achilles tendon surgery     Patient Active Problem List   Diagnosis Date Noted   Bradycardia 04/08/2022   Elevated liver enzymes 01/30/2022   Other fatigue 01/28/2022   SOB (shortness of breath) on exertion 01/28/2022   Diabetes mellitus ([place]) 01/28/2022   Mixed hyperlipidemia 01/28/2022   Health care maintenance 01/28/2022   Vitamin D deficiency 01/28/2022   Class 2 severe obesity with serious comorbidity and body mass index (BMI) of 36.0 to 36.9 in adult ([place]) 01/28/2022   Type 2 diabetes mellitus without complication, without long-term current use of insulin ([place]) 08/20/2016   Hx of colonic polyps 09/30/2011   GYNECOMASTIA 01/15/2010   NEPHROLITHIASIS, HX OF 05/04/2008   SHOULDER PAIN, LEFT 11/08/2007   Obesity 01/22/2007   ERECTILE  DYSFUNCTION 01/22/2007   Essential hypertension 01/22/2007   ALLERGIC RHINITIS, SEASONAL 01/22/2007   GERD 01/22/2007   HYPOKALEMIA, HX OF 01/22/2007    REFERRING DIAG: M76.62 (ICD-10-CM) - Achilles tendinitis of left lower extremity   THERAPY DIAG:  Pain in left foot  Difficulty in walking, not elsewhere classified  Rationale for Evaluation and Treatment Rehabilitation  PERTINENT HISTORY: L Achilles Tendinitis. Pain began about 6 months ago, sudden onset, unknown mechanism of injury. Pt has R achilles surgery about 12 years ago to remove the bone spur. L achilles feels similar. Had an MRI which did not reveal a tear. PLOF: walks a few miles about 3-4 times a week and pain level when walking is about a 2-3/10. Has a CAM boot and wears it at home and grocery store. Does not wear CAM boot when doing his walking and gym routine. Pain is about the same since onset.  PRECAUTIONS: No known precautions   SUBJECTIVE: Taking Mobic again last Tuesday which is helping. Standing up immediately in the morning, the pain level is an 8/10, however the stretching brings it down to 4/10. 0/10 when not moving. 6/10 when walking from waiting room to gym.      PAIN:  Are you having pain? 6/10 when  walking from waiting room to treatment room.      TODAY'S TREATMENT:   Manual therapy   Supine A to P to L talus grade 3 to promote ankle DF ROM  L S/L EV mob to calcaneus grade 3 to prmote more neutral posture  Prone STM L gastroc to decrease latent trigger points      Therapeutic exercise  Standing eccentric heel lowering with B UE assist 10x3 at first stair step  L ankle DF AROM  Prone manually resisted hip extension R and L1x each   Prone glute max extension with 2 pillows under abdomen   L 10x3  R 10x3   Worked on glute strength secondary to weakness and to decrease gastroc muscle overuse.   Reviewed and given as part of his HEP. Pt demonstrated and verbalized understanding. Handout  provided.    Recommended trying eccentric heel lowering at leg press at gym to add more challenge as needed Can also use the glute extension machine at the gym pt goes to. Pt verbalized understanding.     Improved exercise technique, movement at target joints, use of target muscles after mod verbal, visual, tactile cues.        Response to treatment Pt tolerated session well without aggravation of symptoms.         Clinical impression  Pt demonstrates improved L ankle DF AROM, and R hip extension strength since initial evaluation. No change in pain level at worst which is usually first thing in the morning after a prolonged static position while sleeping. Still demonstrates difficulty ambulating longer distances of about 2 miles secondary to symptoms. Continued working on eccentric loading to L Achilles to promote healing. Added joint mobilization to promote ankle DF as well as improving eversion of calcaneous to promote more neutral positioning at his foot to decrease excessive stresses to affected areas with ambulating longer distances. Pt tolerated session well without aggravation of symptoms.  Pt will benefit from continued skilled physical therapy services to decrease pain, improve strength and function.     PATIENT EDUCATION: Education details: there-ex, HEP Person educated: Patient Education method: Explanation, Demonstration, Tactile cues, Verbal cues, and Handouts Education comprehension: verbalized understanding and returned demonstration   HOME EXERCISE PROGRAM: Medbridge Access Code: [email or web address] URL: [email or web address] Date: 04/08/2022 Prepared by: Joneen Boers  Exercises - Standing Heel Raise with Support  - 1 x daily - 7 x weekly - 3 sets - 10 reps - Mini Squat with Counter Support  - 1 x daily - 7 x weekly - 3 sets - 10 reps - Standing Bilateral Gastroc Stretch with Step  - 2 x daily - 7 x weekly - 1 sets - 3 reps - 1 minute hold Standing  eccentric B heel lowering at first stair step with B UE assist 10x3  SLS L LE without UE assist (light touch assist PRN) 30 seconds x 3  - Prone Hip Extension with Bent Knee  - 1 x daily - 7 x weekly - 3 sets - 10 reps   PT Short Term Goals - 04/02/22 1252       PT SHORT TERM GOAL #1   Title Pt will be independent with his initial HEP to decrease pain, improve ankle ROM, strength, function and improve comfort with gait.    Baseline Pt has not yet started his HEP (04/02/2022)    Time 3    Period Weeks    Status New    Target Date 04/24/22  PT Long Term Goals - 05/15/22 1107       PT LONG TERM GOAL #1   Title Pt will have a decrease in L Achilles pain to 3/10 or less at worst to promote ability to ambulate first thing in the morning as well as after sitting for prolonged periods more comfortably.    Baseline 8/10 L Achilles pain at worst for the past 3 months (04/02/2022); 8/10 at worst for the past 7 days, usually when still,    Time 8    Period Weeks    Status On-going    Target Date 05/29/22      PT LONG TERM GOAL #2   Title Pt will improve his FOTO score by at least 10 points as a demonstration of improved function.    Baseline L foot FOTO 63 (04/02/2022); 58 (05/15/2022)    Time 8    Period Weeks    Status On-going    Target Date 05/29/22      PT LONG TERM GOAL #3   Title Pt will improve L ankle DF AROM to at least 15 degrees to promote ability to ambulate more comfortably after prolonged sitting and first thing in the morning.    Baseline L ankle DF AROM 1 degree (04/02/2022); 6 degrees (05/15/2022)    Time 8    Period Weeks    Status Partially Met    Target Date 05/29/22      PT LONG TERM GOAL #4   Title Pt will improve B hip extension strength by at least 1/2 MMT to promote ability to ambulate more comfortably.    Baseline Hip extension R 3+/5, L 4-/5 (04/02/2022); 4/5 R 4-/5 L (05/15/2022)    Time 8    Period Weeks    Status Partially Met    Target  Date 05/29/22              Plan - 05/15/22 1102     Clinical Impression Statement Pt demonstrates improved L ankle DF AROM, and R hip extension strength since initial evaluation. No change in pain level at worst which is usually first thing in the morning after a prolonged static position while sleeping. Still demonstrates difficulty ambulating longer distances of about 2 miles secondary to symptoms. Continued working on eccentric loading to L Achilles to promote healing. Added joint mobilization to promote ankle DF as well as improving eversion of calcaneous to promote more neutral positioning at his foot to decrease excessive stresses to affected areas with ambulating longer distances. Pt tolerated session well without aggravation of symptoms.  Pt will benefit from continued skilled physical therapy services to decrease pain, improve strength and function.    Personal Factors and Comorbidities Comorbidity 2;Past/Current Experience;Time since onset of injury/illness/exacerbation;Fitness    Comorbidities DM, HTN,    Examination-Activity Limitations Locomotion Level;Stairs;Carry    Stability/Clinical Decision Making Stable/Uncomplicated    Rehab Potential Fair    PT Frequency 2x / week    PT Duration 8 weeks    PT Treatment/Interventions Manual techniques;Neuromuscular re-education;Therapeutic exercise;Therapeutic activities;Gait training;Stair training;Functional mobility training;Balance training;Patient/family education;Dry needling;Electrical Stimulation;Iontophoresis 53m/ml Dexamethasone    PT Next Visit Plan L ankle ROM, hip strengthening, manual techniques, modalities PRN    PT Home Exercise Plan Medbridge Access Code: [email or web address]    Consulted and Agree with Plan of Care Patient                Thank you for your referral.  MJoneen BoersPT, DPT  05/15/2022, 6:51 PM

## 2022-05-19 NOTE — Progress Notes (Signed)
Chief Complaint:   OBESITY Robert Mcguire is here to discuss his progress with his obesity treatment plan along with follow-up of his obesity related diagnoses. Robert Mcguire is on the Category 3 Plan and states he is following his eating plan approximately 70% of the time. Robert Mcguire states he is walking, strengthening, and on the elliptical for 45 minutes 4 times per week.  Today's visit was #: 5 Starting weight: 232 lbs Starting date: 01/28/2022 Today's weight: 219 lbs Today's date: 05/06/2022 Total lbs lost to date: 13 Total lbs lost since last in-office visit: 0  Interim History: Robert Mcguire has slowed down with weight loss, but he has not been as active as before. He following the Category 3 plan and he thinks he gets at least 1800 calories and 100 grams protein daily . He is drinking muscle milk shakes daily. He wants more variety.   Subjective:   1. Type 2 diabetes mellitus with other specified complication, without long-term current use of insulin (HCC) Robert Mcguire is taking Jardiance with no side effects noted. His last A1c was 5.3.  2. Essential hypertension Robert Mcguire's blood pressure is slightly elevated today. He saw Cardiology, Dr. Angelena Form for bradycardia. He felt this to be related to beta blocker (metoprolol), but planning an 2D echocardiogram to assess LV function and rule out structure abnormality.   Assessment/Plan:   1. Type 2 diabetes mellitus with other specified complication, without long-term current use of insulin ([place]) Robert Mcguire will continue [place], and he will continue his eating plan and exercise.   2. Essential hypertension Robert Mcguire will continue metoprolol, benazepril, and amlodipine, and he will follow-up as directed. He will continue to monitor his blood pressure.   3. Obesity, Current BMI 34.3 Robert Mcguire is currently in the action stage of change. As such, his goal is to continue with weight loss efforts. He has agreed to the Category 3 Plan.   Provided a handout for other breakfast options.   Exercise  goals: As is.   Behavioral modification strategies: increasing lean protein intake, decreasing simple carbohydrates, increasing water intake, meal planning and cooking strategies, and keeping healthy foods in the home.  Robert Mcguire has agreed to follow-up with our clinic in 4 weeks. He was informed of the importance of frequent follow-up visits to maximize his success with intensive lifestyle modifications for his multiple health conditions.   Objective:   Blood pressure (!) 143/71, pulse 71, temperature 98.5 F (36.9 C), height '5\' 7"'$  (1.702 m), weight 219 lb (99.3 kg), SpO2 99 %. Body mass index is 34.3 kg/m.  General: Cooperative, alert, well developed, in no acute distress. HEENT: Conjunctivae and lids unremarkable. Cardiovascular: Regular rhythm.  Lungs: Normal work of breathing. Neurologic: No focal deficits.   Lab Results  Component Value Date   CREATININE 0.81 01/29/2022   BUN 19 01/29/2022   NA 138 01/29/2022   K 4.4 01/29/2022   CL 99 01/29/2022   CO2 22 01/29/2022   Lab Results  Component Value Date   ALT 60 (H) 01/29/2022   AST 47 (H) 01/29/2022   ALKPHOS 65 01/29/2022   BILITOT 1.3 (H) 01/29/2022   Lab Results  Component Value Date   HGBA1C 5.3 01/29/2022   HGBA1C 5.6 09/30/2021   HGBA1C 5.5 03/15/2021   HGBA1C 5.4 09/20/2020   HGBA1C 5.4 06/08/2020   Lab Results  Component Value Date   INSULIN 10.3 01/29/2022   Lab Results  Component Value Date   TSH 1.810 01/29/2022   Lab Results  Component Value Date  CHOL 127 01/29/2022   HDL 44 01/29/2022   LDLCALC 70 01/29/2022   LDLDIRECT 168.0 05/13/2016   TRIG 63 01/29/2022   CHOLHDL 3 09/30/2021   Lab Results  Component Value Date   VD25OH 35.6 01/29/2022   VD25OH 33.70 09/20/2020   Lab Results  Component Value Date   WBC 4.1 09/30/2021   HGB 16.3 09/30/2021   HCT 48.3 09/30/2021   MCV 90.3 09/30/2021   PLT 162.0 09/30/2021   No results found for: "IRON", "TIBC", "FERRITIN"  Attestation  Statements:   Reviewed by clinician on day of visit: allergies, medications, problem list, medical history, surgical history, family history, social history, and previous encounter notes.  Time spent on visit including pre-visit chart review and post-visit care and charting was 30 minutes.   I, Trixie Dredge, am acting as transcriptionist for Dennard Nip, MD.  I have reviewed the above documentation for accuracy and completeness, and I agree with the above. -  Dennard Nip, MD

## 2022-05-20 ENCOUNTER — Ambulatory Visit: Payer: 59

## 2022-05-20 DIAGNOSIS — M79672 Pain in left foot: Secondary | ICD-10-CM | POA: Diagnosis not present

## 2022-05-20 DIAGNOSIS — R262 Difficulty in walking, not elsewhere classified: Secondary | ICD-10-CM | POA: Diagnosis not present

## 2022-05-20 NOTE — Therapy (Signed)
OUTPATIENT PHYSICAL THERAPY TREATMENT NOTE    Patient Name: Robert Mcguire MRN: [id number] DOB:1960-12-05, 61 y.o., male Today's Date: 05/20/2022  PCP: [name], MD REFERRING PROVIDER: Garrel Ridgel, DPM   PT End of Session - 05/20/22 1018     Visit Number 11    Number of Visits 17    Date for PT Re-Evaluation 05/29/22    Authorization Type 1    Authorization Time Period of 10 progress report    PT Start Time 1019    PT Stop Time 1100    PT Time Calculation (min) 41 min    Activity Tolerance Patient tolerated treatment well    Behavior During Therapy WFL for tasks assessed/performed                     Past Medical History:  Diagnosis Date   Allergic rhinitis    Allergy    Arthritis    Diabetes mellitus without complication ([place])    ED (erectile dysfunction)    GERD (gastroesophageal reflux disease)    Glaucoma    High cholesterol    Hyperlipidemia    Hypertension    Impaired glucose tolerance    Nephrolithiasis    Obesity    Past Surgical History:  Procedure Laterality Date   COLONOSCOPY     difficultity with intabation     GYNECOMASTIA EXCISION Bilateral    POLYPECTOMY     right achilles tendon surgery     Patient Active Problem List   Diagnosis Date Noted   Bradycardia 04/08/2022   Elevated liver enzymes 01/30/2022   Other fatigue 01/28/2022   SOB (shortness of breath) on exertion 01/28/2022   Diabetes mellitus ([place]) 01/28/2022   Mixed hyperlipidemia 01/28/2022   Health care maintenance 01/28/2022   Vitamin D deficiency 01/28/2022   Class 2 severe obesity with serious comorbidity and body mass index (BMI) of 36.0 to 36.9 in adult ([place]) 01/28/2022   Type 2 diabetes mellitus without complication, without long-term current use of insulin ([place]) 08/20/2016   Hx of colonic polyps 09/30/2011   GYNECOMASTIA 01/15/2010   NEPHROLITHIASIS, HX OF 05/04/2008   SHOULDER PAIN, LEFT 11/08/2007   Obesity 01/22/2007   ERECTILE DYSFUNCTION  01/22/2007   Essential hypertension 01/22/2007   ALLERGIC RHINITIS, SEASONAL 01/22/2007   GERD 01/22/2007   HYPOKALEMIA, HX OF 01/22/2007    REFERRING DIAG: M76.62 (ICD-10-CM) - Achilles tendinitis of left lower extremity   THERAPY DIAG:  Pain in left foot  Difficulty in walking, not elsewhere classified  Rationale for Evaluation and Treatment Rehabilitation  PERTINENT HISTORY: L Achilles Tendinitis. Pain began about 6 months ago, sudden onset, unknown mechanism of injury. Pt has R achilles surgery about 12 years ago to remove the bone spur. L achilles feels similar. Had an MRI which did not reveal a tear. PLOF: walks a few miles about 3-4 times a week and pain level when walking is about a 2-3/10. Has a CAM boot and wears it at home and grocery store. Does not wear CAM boot when doing his walking and gym routine. Pain is about the same since onset.  PRECAUTIONS: No known precautions   SUBJECTIVE: L heel seemed have been a little better past few mornings. Today has been the best it has felt. The L heel (calcaneus) felt pretty good after last session. Back up to walking a couple of miles a day. Has been also taking Mobic, last does was this morning.  PAIN:  Are you having pain? 1/10 when walking from waiting room to treatment room.      TODAY'S TREATMENT:   Manual therapy    Supine A to P to L talus grade 3 to promote ankle DF ROM  L S/L EV mob to calcaneus grade 3 to promote more neutral posture  Prone STM L medial and lateral gastroc muscle to decrease latent trigger points  Prone L calcaneal IV and EV with ankle distraction by PT to promote mobility and more neutral position of calcaneus.      Therapeutic exercise  R S/L L ankle EV 10x3 with 5 second holds  Leg press, plate 95  Eccentric heel lowering B 10x2  Standing eccentric B heel lowering with R UE assist and L hand carrying 15 lb dumbell 10x  Then with L heel only 10x without weight and B UE assist.  7/10 heel pain, eases with rest  Then with B heels again with B UE assist 10x. Fine per pt.    Improved exercise technique, movement at target joints, use of target muscles after mod verbal, visual, tactile cues.        Response to treatment No L heel pain during gait reported after session.         Clinical impression Some carry over of decreased L Achilles pain in the morning based on subjective reports. Continued with joint mobilization to promote ankle DF as well as improving eversion of calcaneous to promote more neutral positioning at his foot to decrease excessive stresses to affected areas with ambulating longer distances.  Continued working on eccentric loading to L Achilles to promote healing. Pt tolerated session well without aggravation of symptoms. No pain reported during gait after session. Pt will benefit from continued skilled physical therapy services to decrease pain, improve strength and function.     PATIENT EDUCATION: Education details: there-ex, HEP Person educated: Patient Education method: Explanation, Demonstration, Tactile cues, Verbal cues, and Handouts Education comprehension: verbalized understanding and returned demonstration   HOME EXERCISE PROGRAM: Medbridge Access Code: [email or web address] URL: [email or web address] Date: 04/08/2022 Prepared by: [place] with Support  - 1 x daily - 7 x weekly - 3 sets - 10 reps - Mini Squat with Counter Support  - 1 x daily - 7 x weekly - 3 sets - 10 reps - Standing Bilateral Gastroc Stretch with Step  - 2 x daily - 7 x weekly - 1 sets - 3 reps - 1 minute hold Standing eccentric B heel lowering at first stair step with B UE assist 10x3  SLS L LE without UE assist (light touch assist PRN) 30 seconds x 3  - Prone Hip Extension with Bent Knee  - 1 x daily - 7 x weekly - 3 sets - 10 reps  - Sidelying Ankle Eversion Strengthening with Ankle Weight  - 1 x daily - 7 x weekly  - 3 sets - 10 reps - 5 seconds hold    PT Short Term Goals - 04/02/22 1252       PT SHORT TERM GOAL #1   Title Pt will be independent with his initial HEP to decrease pain, improve ankle ROM, strength, function and improve comfort with gait.    Baseline Pt has not yet started his HEP (04/02/2022)    Time 3    Period Weeks    Status New    Target Date 04/24/22  PT Long Term Goals - 05/15/22 1107       PT LONG TERM GOAL #1   Title Pt will have a decrease in L Achilles pain to 3/10 or less at worst to promote ability to ambulate first thing in the morning as well as after sitting for prolonged periods more comfortably.    Baseline 8/10 L Achilles pain at worst for the past 3 months (04/02/2022); 8/10 at worst for the past 7 days, usually when still,    Time 8    Period Weeks    Status On-going    Target Date 05/29/22      PT LONG TERM GOAL #2   Title Pt will improve his FOTO score by at least 10 points as a demonstration of improved function.    Baseline L foot FOTO 63 (04/02/2022); 58 (05/15/2022)    Time 8    Period Weeks    Status On-going    Target Date 05/29/22      PT LONG TERM GOAL #3   Title Pt will improve L ankle DF AROM to at least 15 degrees to promote ability to ambulate more comfortably after prolonged sitting and first thing in the morning.    Baseline L ankle DF AROM 1 degree (04/02/2022); 6 degrees (05/15/2022)    Time 8    Period Weeks    Status Partially Met    Target Date 05/29/22      PT LONG TERM GOAL #4   Title Pt will improve B hip extension strength by at least 1/2 MMT to promote ability to ambulate more comfortably.    Baseline Hip extension R 3+/5, L 4-/5 (04/02/2022); 4/5 R 4-/5 L (05/15/2022)    Time 8    Period Weeks    Status Partially Met    Target Date 05/29/22              Plan - 05/20/22 1833     Clinical Impression Statement Some carry over of decreased L Achilles pain in the morning based on subjective reports.  Continued with joint mobilization to promote ankle DF as well as improving eversion of calcaneous to promote more neutral positioning at his foot to decrease excessive stresses to affected areas with ambulating longer distances.  Continued working on eccentric loading to L Achilles to promote healing. Pt tolerated session well without aggravation of symptoms. No pain reported during gait after session. Pt will benefit from continued skilled physical therapy services to decrease pain, improve strength and function.    Personal Factors and Comorbidities Comorbidity 2;Past/Current Experience;Time since onset of injury/illness/exacerbation;Fitness    Comorbidities DM, HTN,    Examination-Activity Limitations Locomotion Level;Stairs;Carry    Stability/Clinical Decision Making Stable/Uncomplicated    Clinical Decision Making Low    Rehab Potential Fair    PT Frequency 2x / week    PT Duration 8 weeks    PT Treatment/Interventions Manual techniques;Neuromuscular re-education;Therapeutic exercise;Therapeutic activities;Gait training;Stair training;Functional mobility training;Balance training;Patient/family education;Dry needling;Electrical Stimulation;Iontophoresis 49m/ml Dexamethasone    PT Next Visit Plan L ankle ROM, hip strengthening, manual techniques, modalities PRN    PT Home Exercise Plan Medbridge Access Code: [email or web address]    Consulted and Agree with Plan of Care Patient                  MJoneen BoersPT, DPT  05/20/2022, 6:34 PM

## 2022-05-22 ENCOUNTER — Ambulatory Visit (HOSPITAL_COMMUNITY): Payer: 59 | Attending: Cardiovascular Disease

## 2022-05-22 DIAGNOSIS — R001 Bradycardia, unspecified: Secondary | ICD-10-CM | POA: Insufficient documentation

## 2022-05-22 MED ORDER — PERFLUTREN LIPID MICROSPHERE
1.0000 mL | INTRAVENOUS | Status: AC | PRN
  Administered 2022-05-22: 2 mL via INTRAVENOUS

## 2022-05-23 LAB — ECHOCARDIOGRAM COMPLETE
Area-P 1/2: 3.93 cm2
S' Lateral: 4.6 cm

## 2022-05-26 ENCOUNTER — Ambulatory Visit: Payer: 59

## 2022-05-26 DIAGNOSIS — R262 Difficulty in walking, not elsewhere classified: Secondary | ICD-10-CM | POA: Diagnosis not present

## 2022-05-26 DIAGNOSIS — M79672 Pain in left foot: Secondary | ICD-10-CM | POA: Diagnosis not present

## 2022-05-26 NOTE — Therapy (Signed)
OUTPATIENT PHYSICAL THERAPY TREATMENT NOTE And Discharge Summary    Patient Name: Robert Mcguire MRN: [id number] DOB:02/19/1961, 61 y.o., male Today's Date: 05/26/2022  PCP: [name], MD REFERRING PROVIDER: Garrel Ridgel, DPM   PT End of Session - 05/26/22 1102     Visit Number 12    Number of Visits 17    Date for PT Re-Evaluation 05/29/22    Authorization Type 2    Authorization Time Period of 10 progress report    PT Start Time 1102    PT Stop Time 6812    PT Time Calculation (min) 42 min    Activity Tolerance Patient tolerated treatment well    Behavior During Therapy WFL for tasks assessed/performed                      Past Medical History:  Diagnosis Date   Allergic rhinitis    Allergy    Arthritis    Diabetes mellitus without complication ([place])    ED (erectile dysfunction)    GERD (gastroesophageal reflux disease)    Glaucoma    High cholesterol    Hyperlipidemia    Hypertension    Impaired glucose tolerance    Nephrolithiasis    Obesity    Past Surgical History:  Procedure Laterality Date   COLONOSCOPY     difficultity with intabation     GYNECOMASTIA EXCISION Bilateral    POLYPECTOMY     right achilles tendon surgery     Patient Active Problem List   Diagnosis Date Noted   Bradycardia 04/08/2022   Elevated liver enzymes 01/30/2022   Other fatigue 01/28/2022   SOB (shortness of breath) on exertion 01/28/2022   Diabetes mellitus ([place]) 01/28/2022   Mixed hyperlipidemia 01/28/2022   Health care maintenance 01/28/2022   Vitamin D deficiency 01/28/2022   Class 2 severe obesity with serious comorbidity and body mass index (BMI) of 36.0 to 36.9 in adult ([place]) 01/28/2022   Type 2 diabetes mellitus without complication, without long-term current use of insulin ([place]) 08/20/2016   Hx of colonic polyps 09/30/2011   GYNECOMASTIA 01/15/2010   NEPHROLITHIASIS, HX OF 05/04/2008   SHOULDER PAIN, LEFT 11/08/2007   Obesity 01/22/2007    ERECTILE DYSFUNCTION 01/22/2007   Essential hypertension 01/22/2007   ALLERGIC RHINITIS, SEASONAL 01/22/2007   GERD 01/22/2007   HYPOKALEMIA, HX OF 01/22/2007    REFERRING DIAG: M76.62 (ICD-10-CM) - Achilles tendinitis of left lower extremity   THERAPY DIAG:  Pain in left foot  Difficulty in walking, not elsewhere classified  Rationale for Evaluation and Treatment Rehabilitation  PERTINENT HISTORY: L Achilles Tendinitis. Pain began about 6 months ago, sudden onset, unknown mechanism of injury. Pt has R achilles surgery about 12 years ago to remove the bone spur. L achilles feels similar. Had an MRI which did not reveal a tear. PLOF: walks a few miles about 3-4 times a week and pain level when walking is about a 2-3/10. Has a CAM boot and wears it at home and grocery store. Does not wear CAM boot when doing his walking and gym routine. Pain is about the same since onset.  PRECAUTIONS: No known precautions   SUBJECTIVE: Today might be his last session today. L heel is doing pretty good. No pain currently. The mornings are still bothersome initially. Does a little bit of stretching when he gets up which helps. Has not taken Mobic in the last couple of days.  Feels like he can continue  with his HEP. Definitely in a better spot since when he started PT.      PAIN:  Are you having pain? 1/10 when walking from waiting room to treatment room.      TODAY'S TREATMENT:   Manual therapy   Prone STM L medial and lateral gastroc muscle to decrease latent trigger points  Prone L calcaneal IV and EV with ankle distraction by PT to promote mobility and more neutral position of calcaneus.   Supine A to P to L talus grade 3 to promote ankle DF ROM  L S/L EV mob to calcaneus grade 3 to promote more neutral posture    Therapeutic exercise  Supine L ankle DF AROM  Prone manually resisted hip extension   R S/L L ankle EV 10x3 with 5 second holds   Standing eccentric L heel lowering with  B UE assist 10x  Forward walking lunges 32 ft x 2  Again Standing eccentric L heel lowering with B UE assist 10x  Side step with squat 32 ft to the L and 32 ft to the R   Again Standing eccentric L heel lowering with B UE assist 10x     Improved exercise technique, movement at target joints, use of target muscles after mod verbal, visual, tactile cues.        Response to treatment Pt tolerated session well without aggravation of symptoms.         Clinical impression Pt demonstrates overall decreased L Achilles pain, improved bilateral glute strength, L ankle DF AROM, and function since initial evaluation. Pt has made progress with PT towards goals. Skilled physical therapy services discharged with pt continuing with his exercises at home.      PATIENT EDUCATION: Education details: there-ex, HEP Person educated: Patient Education method: Explanation, Demonstration, Tactile cues, Verbal cues, and Handouts Education comprehension: verbalized understanding and returned demonstration   HOME EXERCISE PROGRAM: Medbridge Access Code: [email or web address] URL: [email or web address] Date: 04/08/2022 Prepared by: [place] with Support  - 1 x daily - 7 x weekly - 3 sets - 10 reps - Mini Squat with Counter Support  - 1 x daily - 7 x weekly - 3 sets - 10 reps - Standing Bilateral Gastroc Stretch with Step  - 2 x daily - 7 x weekly - 1 sets - 3 reps - 1 minute hold Standing eccentric B heel lowering at first stair step with B UE assist 10x3  SLS L LE without UE assist (light touch assist PRN) 30 seconds x 3  - Prone Hip Extension with Bent Knee  - 1 x daily - 7 x weekly - 3 sets - 10 reps  - Sidelying Ankle Eversion Strengthening with Ankle Weight  - 1 x daily - 7 x weekly - 3 sets - 10 reps - 5 seconds hold    PT Short Term Goals - 05/26/22 1105       PT SHORT TERM GOAL #1   Title Pt will be independent with his initial HEP to  decrease pain, improve ankle ROM, strength, function and improve comfort with gait.    Baseline Pt has not yet started his HEP (04/02/2022); performing his HEP independently (05/26/2022)    Time 3    Period Weeks    Status Achieved    Target Date 04/24/22              PT Long Term Goals - 05/26/22 1106       PT  LONG TERM GOAL #1   Title Pt will have a decrease in L Achilles pain to 3/10 or less at worst to promote ability to ambulate first thing in the morning as well as after sitting for prolonged periods more comfortably.    Baseline 8/10 L Achilles pain at worst for the past 3 months (04/02/2022); 8/10 at worst for the past 7 days, usually when still; 6/10 at most for the past 7 days. More comfortable during the day (05/26/2022)    Time 8    Period Weeks    Status Partially Met    Target Date 05/29/22      PT LONG TERM GOAL #2   Title Pt will improve his FOTO score by at least 10 points as a demonstration of improved function.    Baseline L foot FOTO 63 (04/02/2022); 58 (05/15/2022); 76 (05/26/2022)    Time 8    Period Weeks    Status Achieved    Target Date 05/29/22      PT LONG TERM GOAL #3   Title Pt will improve L ankle DF AROM to at least 15 degrees to promote ability to ambulate more comfortably after prolonged sitting and first thing in the morning.    Baseline L ankle DF AROM 1 degree (04/02/2022); 6 degrees (05/15/2022); 7 degrees (05/26/2022)    Time 8    Period Weeks    Status Partially Met    Target Date 05/29/22      PT LONG TERM GOAL #4   Title Pt will improve B hip extension strength by at least 1/2 MMT to promote ability to ambulate more comfortably.    Baseline Hip extension R 3+/5, L 4-/5 (04/02/2022); 4/5 R 4-/5 L (05/15/2022); 4/5 R, 4/5 L (05/26/2022)    Time 8    Period Weeks    Status Achieved    Target Date 05/29/22              Plan - 05/26/22 1101     Clinical Impression Statement Pt demonstrates overall decreased L Achilles pain, improved  bilateral glute strength, L ankle DF AROM, and function since initial evaluation. Pt has made progress with PT towards goals. Skilled physical therapy services discharged with pt continuing with his exercises at home.    Personal Factors and Comorbidities --    Comorbidities --    Examination-Activity Limitations --    Stability/Clinical Decision Making --    Rehab Potential --    PT Frequency --    PT Duration 8 weeks    PT Treatment/Interventions Manual techniques;Neuromuscular re-education;Therapeutic exercise;Therapeutic activities;Gait training;Stair training;Functional mobility training;Balance training;Patient/family education    PT Next Visit Plan Conitnue progress with [place] Access Code: [email or web address]    Consulted and Agree with Plan of Care Patient                 Thank you for your referral.   Joneen Boers PT, DPT  05/26/2022, 11:56 AM

## 2022-05-27 ENCOUNTER — Encounter: Payer: Self-pay | Admitting: Cardiovascular Disease

## 2022-05-27 ENCOUNTER — Telehealth: Payer: Self-pay | Admitting: *Deleted

## 2022-05-27 DIAGNOSIS — Z01812 Encounter for preprocedural laboratory examination: Secondary | ICD-10-CM

## 2022-05-27 DIAGNOSIS — I519 Heart disease, unspecified: Secondary | ICD-10-CM

## 2022-05-27 DIAGNOSIS — I517 Cardiomegaly: Secondary | ICD-10-CM

## 2022-05-27 NOTE — Telephone Encounter (Signed)
Reviewed results and recommendations with the patient.  He will have the coronary cta and would like to follow up with Dr. Angelena Form afterward.  I reviewed instructions with him and sent the instructions to him in [place].  He is aware he will be contacted to scheduled and we will arrange follow up after that.  Will come tomorrow for BMET.

## 2022-05-27 NOTE — Telephone Encounter (Signed)
-----   Message from Burnell Blanks, MD sent at 05/26/2022  7:38 AM EST ----- His heart muscle is weaker than normal. His valves are ok. He will need a coronary CTA to exclude CAD. I will be happy to see him back in the office to discuss this or we can go ahead and plan the CTA if he is willing and then see him back in the office after the CTA. Gerald Stabs

## 2022-05-28 ENCOUNTER — Ambulatory Visit: Payer: 59

## 2022-05-28 ENCOUNTER — Ambulatory Visit: Payer: 59 | Attending: Cardiovascular Disease

## 2022-05-28 DIAGNOSIS — Z01812 Encounter for preprocedural laboratory examination: Secondary | ICD-10-CM

## 2022-05-29 LAB — BASIC METABOLIC PANEL
BUN/Creatinine Ratio: 23 (ref 10–24)
BUN: 20 mg/dL (ref 8–27)
CO2: 20 mmol/L (ref 20–29)
Calcium: 9.5 mg/dL (ref 8.6–10.2)
Chloride: 102 mmol/L (ref 96–106)
Creatinine, Ser: 0.86 mg/dL (ref 0.76–1.27)
Glucose: 92 mg/dL (ref 70–99)
Potassium: 4 mmol/L (ref 3.5–5.2)
Sodium: 136 mmol/L (ref 134–144)
eGFR: 98 mL/min/{1.73_m2} (ref >59)

## 2022-06-11 ENCOUNTER — Telehealth (HOSPITAL_COMMUNITY): Payer: Self-pay | Admitting: *Deleted

## 2022-06-11 ENCOUNTER — Encounter (INDEPENDENT_AMBULATORY_CARE_PROVIDER_SITE_OTHER): Payer: Self-pay | Admitting: Family Medicine

## 2022-06-11 ENCOUNTER — Ambulatory Visit (INDEPENDENT_AMBULATORY_CARE_PROVIDER_SITE_OTHER): Payer: 59 | Admitting: Family Medicine

## 2022-06-11 VITALS — BP 138/71 | HR 56 | Temp 97.9°F | Ht 67.0 in | Wt 213.0 lb

## 2022-06-11 DIAGNOSIS — I509 Heart failure, unspecified: Secondary | ICD-10-CM | POA: Diagnosis not present

## 2022-06-11 DIAGNOSIS — I110 Hypertensive heart disease with heart failure: Secondary | ICD-10-CM

## 2022-06-11 DIAGNOSIS — E669 Obesity, unspecified: Secondary | ICD-10-CM | POA: Diagnosis not present

## 2022-06-11 DIAGNOSIS — E1169 Type 2 diabetes mellitus with other specified complication: Secondary | ICD-10-CM | POA: Diagnosis not present

## 2022-06-11 DIAGNOSIS — I10 Essential (primary) hypertension: Secondary | ICD-10-CM

## 2022-06-11 DIAGNOSIS — Z6834 Body mass index (BMI) 34.0-34.9, adult: Secondary | ICD-10-CM | POA: Diagnosis not present

## 2022-06-11 DIAGNOSIS — Z7984 Long term (current) use of oral hypoglycemic drugs: Secondary | ICD-10-CM

## 2022-06-11 NOTE — Telephone Encounter (Signed)
Reaching out to patient to offer assistance regarding upcoming cardiac imaging study; pt verbalizes understanding of appt date/time, parking situation and where to check in, pre-test NPO status, and verified current allergies; name and call back number provided for further questions should they arise  Robert Clement RN Navigator Cardiac Imaging [facility] [phone] office [phone] cell  Patient to take his daily metoprolol succinate two hours prior to his cardiac CT scan.

## 2022-06-12 ENCOUNTER — Ambulatory Visit
Admission: RE | Admit: 2022-06-12 | Discharge: 2022-06-12 | Disposition: A | Discharge status: Home / Self Care | Payer: 59 | Source: Ambulatory Visit | Attending: Cardiovascular Disease | Admitting: Cardiovascular Disease

## 2022-06-12 DIAGNOSIS — I517 Cardiomegaly: Secondary | ICD-10-CM | POA: Diagnosis not present

## 2022-06-12 DIAGNOSIS — I519 Heart disease, unspecified: Secondary | ICD-10-CM | POA: Insufficient documentation

## 2022-06-12 MED ORDER — IOHEXOL 350 MG/ML SOLN
100.0000 mL | Freq: Once | INTRAVENOUS | Status: AC | PRN
  Administered 2022-06-12: 100 mL via INTRAVENOUS

## 2022-06-12 MED ORDER — NITROGLYCERIN 0.4 MG SL SUBL
0.8000 mg | SUBLINGUAL_TABLET | Freq: Once | SUBLINGUAL | Status: AC
  Administered 2022-06-12: 0.8 mg via SUBLINGUAL

## 2022-06-12 NOTE — Progress Notes (Signed)
Patient tolerated CT well. Drank water after. Vital signs stable encourage to drink water throughout day.Reasons explained and verbalized understanding. Ambulated steady gait.  

## 2022-06-21 ENCOUNTER — Telehealth: Payer: 59 | Admitting: Nurse Practitioner

## 2022-06-21 DIAGNOSIS — U071 COVID-19: Secondary | ICD-10-CM | POA: Diagnosis not present

## 2022-06-21 MED ORDER — NIRMATRELVIR/RITONAVIR (PAXLOVID)TABLET: 3.0000 | ORAL_TABLET | Freq: Two times a day (BID) | ORAL | 0 refills | Status: AC

## 2022-06-21 NOTE — Progress Notes (Signed)
Virtual Visit Consent   Robert Mcguire, you are scheduled for a virtual visit with Robert Mcguire, Robert Mcguire, a [facility] provider, today.     Just as with appointments in the office, your consent must be obtained to participate.  Your consent will be active for this visit and any virtual visit you may have with one of our providers in the next 365 days.     If you have a MyChart account, a copy of this consent can be sent to you electronically.  All virtual visits are billed to your insurance company just like a traditional visit in the office.    As this is a virtual visit, video technology does not allow for your provider to perform a traditional examination.  This may limit your provider's ability to fully assess your condition.  If your provider identifies any concerns that need to be evaluated in person or the need to arrange testing (such as labs, EKG, etc.), we will make arrangements to do so.     Although advances in technology are sophisticated, we cannot ensure that it will always work on either your end or our end.  If the connection with a video visit is poor, the visit may have to be switched to a telephone visit.  With either a video or telephone visit, we are not always able to ensure that we have a secure connection.     I need to obtain your verbal consent now.   Are you willing to proceed with your visit today? YES   Robert Mcguire has provided verbal consent on 06/21/2022 for a virtual visit (video or telephone).   Robert Hassell Done, FNP   Date: 06/21/2022 8:07 AM   Virtual Visit via Video Note   I, Robert Mcguire, connected with Robert Mcguire ([id number], [birth year]) on 06/21/22 at  8:30 AM EST by a video-enabled telemedicine application and verified that I am speaking with the correct person using two identifiers.  Location: Patient: Virtual Visit Location Patient: Home Provider: Virtual Visit Location Provider: Mobile   I discussed the limitations of  evaluation and management by telemedicine and the availability of in person appointments. The patient expressed understanding and agreed to proceed.    History of Present Illness: Robert Mcguire is a 61 y.o. who identifies as a male who was assigned male at birth, and is being seen today for covid positive.  HPI: URI  This is a new problem. The current episode started yesterday. The problem has been waxing and waning. There has been no fever. Associated symptoms include coughing (mild), rhinorrhea and a sore throat. Pertinent negatives include no congestion. He has tried acetaminophen and NSAIDs for the symptoms. The treatment provided mild relief.    Review of Systems  HENT:  Positive for rhinorrhea and sore throat. Negative for congestion.   Respiratory:  Positive for cough (mild).     Problems:  Patient Active Problem List   Diagnosis Date Noted   Bradycardia 04/08/2022   Elevated liver enzymes 01/30/2022   Other fatigue 01/28/2022   SOB (shortness of breath) on exertion 01/28/2022   Diabetes mellitus ([place]) 01/28/2022   Mixed hyperlipidemia 01/28/2022   Health care maintenance 01/28/2022   Vitamin D deficiency 01/28/2022   Class 2 severe obesity with serious comorbidity and body mass index (BMI) of 36.0 to 36.9 in adult [facility]) 01/28/2022   Type 2 diabetes mellitus without complication, without long-term current use of insulin ([place]) 08/20/2016   Hx of colonic polyps  09/30/2011   GYNECOMASTIA 01/15/2010   NEPHROLITHIASIS, HX OF 05/04/2008   SHOULDER PAIN, LEFT 11/08/2007   Obesity 01/22/2007   ERECTILE DYSFUNCTION 01/22/2007   Essential hypertension 01/22/2007   ALLERGIC RHINITIS, SEASONAL 01/22/2007   GERD 01/22/2007   HYPOKALEMIA, HX OF 01/22/2007    Allergies: No Known Allergies Medications:  Current Outpatient Medications:    alfuzosin (UROXATRAL) 10 MG 24 hr tablet, Take 10 mg by mouth daily., Disp: , Rfl:    amLODipine (NORVASC) 10 MG tablet, TAKE 1 TABLET BY MOUTH EVERY  DAY, Disp: 90 tablet, Rfl: 1   atorvastatin (LIPITOR) 40 MG tablet, Take 1 tablet (40 mg total) by mouth daily., Disp: 90 tablet, Rfl: 3   benazepril (LOTENSIN) 40 MG tablet, Take 1 tablet (40 mg total) by mouth daily., Disp: 90 tablet, Rfl: 3   Cyanocobalamin (VITAMIN B-12 PO), Take by mouth., Disp: , Rfl:    ELDERBERRY PO, Take by mouth., Disp: , Rfl:    empagliflozin (JARDIANCE) 25 MG TABS tablet, Take 1 tablet (25 mg total) by mouth daily., Disp: 90 tablet, Rfl: 3   latanoprost (XALATAN) 0.005 % ophthalmic solution, , Disp: , Rfl:    metoprolol succinate (TOPROL-XL) 100 MG 24 hr tablet, Take with or immediately following a meal., Disp: 90 tablet, Rfl: 3   MOBIC 15 MG tablet, Take 1 tablet (15 mg total) by mouth daily., Disp: 30 tablet, Rfl: 3   Multiple Vitamin (MULTIVITAMIN ADULT PO), Take by mouth., Disp: , Rfl:    sildenafil (VIAGRA) 100 MG tablet, Take 1 tablet (100 mg total) by mouth daily as needed for erectile dysfunction., Disp: 30 tablet, Rfl: 6   tadalafil (CIALIS) 5 MG tablet, Take 5 mg by mouth daily as needed for erectile dysfunction., Disp: , Rfl:    timolol (TIMOPTIC) 0.5 % ophthalmic solution, 1 drop daily., Disp: , Rfl:    VITAMIN D PO, Take by mouth., Disp: , Rfl:   Observations/Objective: Patient is well-developed, well-nourished in no acute distress.  Resting comfortably  at home.  Head is normocephalic, atraumatic.  No labored breathing.  Speech is clear and coherent with logical content.  Patient is alert and oriented at baseline.    Assessment and Plan:  Robert Mcguire in today with chief complaint of No chief complaint on file.   1. Positive self-administered antigen test for COVID-19 1. Take meds as prescribed 2. Use a cool mist humidifier especially during the winter months and when heat has been humid. 3. Use saline nose sprays frequently 4. Saline irrigations of the nose can be very helpful if Mcguire frequently.  * 4X daily for 1 week*  * Use of a nettie  pot can be helpful with this. Follow directions with this* 5. Drink plenty of fluids 6. Keep thermostat turn down low 7.For any cough or congestion- mucinex or delsym 8. For fever or aces or pains- take tylenol or ibuprofen appropriate for age and weight.  * for fevers greater than 101 orally you may alternate ibuprofen and tylenol every  3 hours.     Meds ordered this encounter  Medications   nirmatrelvir/ritonavir EUA (PAXLOVID) 20 x 150 MG & 10 x '100MG'$  TABS    Sig: Take 3 tablets by mouth 2 (two) times daily for 5 days. (Take nirmatrelvir 150 mg two tablets twice daily for 5 days and ritonavir 100 mg one tablet twice daily for 5 days) Patient GFR is 98    Dispense:  30 tablet    Refill:  0  Order Specific Question:   Supervising Provider    Answer:   Chase Picket [[id number]]     Follow Up Instructions: I discussed the assessment and treatment plan with the patient. The patient was provided an opportunity to ask questions and all were answered. The patient agreed with the plan and demonstrated an understanding of the instructions.  A copy of instructions were sent to the patient via MyChart.  The patient was advised to call back or seek an in-person evaluation if the symptoms worsen or if the condition fails to improve as anticipated.  Time:  I spent 15 minutes with the patient via telehealth technology discussing the above problems/concerns.    Robert Hassell Done, FNP

## 2022-06-21 NOTE — Patient Instructions (Signed)
1. Take meds as prescribed 2. Use a cool mist humidifier especially during the winter months and when heat has been humid. 3. Use saline nose sprays frequently 4. Saline irrigations of the nose can be very helpful if done frequently.  * 4X daily for 1 week*  * Use of a nettie pot can be helpful with this. Follow directions with this* 5. Drink plenty of fluids 6. Keep thermostat turn down low 7.For any cough or congestion- mucinex or delsym 8. For fever or aces or pains- take tylenol or ibuprofen appropriate for age and weight.  * for fevers greater than 101 orally you may alternate ibuprofen and tylenol every  3 hours.

## 2022-06-23 NOTE — Progress Notes (Unsigned)
Chief Complaint:   OBESITY Traeton is here to discuss his progress with his obesity treatment plan along with follow-up of his obesity related diagnoses. Lauris is on the Category 3 Plan and states he is following his eating plan approximately 70% of the time. Marlowe states he is doing cardio and strengthening for 60 minutes 4 times per week.  Today's visit was #: 6 Starting weight: 232 lbs Starting date: 01/28/2022 Today's weight: 213 lbs Today's date: 06/11/2022 Total lbs lost to date: 19 Total lbs lost since last in-office visit: 6  Interim History: Dayshon continues to do well with his weight loss efforts even over Thanksgiving.  He is working on increasing vegetables and lean protein.  He is doing physical therapy for his Achilles tendon.  He feels he will do reasonably well with avoiding Christmas weight gain.  Subjective:   1. Essential hypertension Delmo's blood pressure is at the upper end of normal.  He has done well with weight loss, but this has not significantly improved his blood pressure despite this.  2. Type 2 diabetes mellitus with other specified complication, without long-term current use of insulin ([place]) Theoren is on Jardiance for diabetes mellitus and cardiac prevention.  He is working on increasing his water intake and his fasting glucose, A1c, and GFR are all within normal limits.  3. Congestive heart failure with decreased EF ([place]) We reviewed the patient's echocardiogram results, results show a decrease in EF of 35-40% and global hypotheses with mild left ventricular hypertrophy.  He has questions about how Jardiance affects his heart.  Assessment/Plan:   1. Essential hypertension Dezmin likely has genetically elevated blood pressure more than lifestyle induced elevated blood pressure.  He will discuss options at his upcoming cardiology appointment.  2. Type 2 diabetes mellitus with other specified complication, without long-term current use of insulin (HCC) Axel will  continue to work on his diet, exercise, and weight loss, as he is doing a great job so far.  3. Congestive heart failure with decreased EF (HCC) Nicholai was educated on function and long-term use of Jardiance.  Will follow-up after his cardiology visit to help make sure he is doing everything possible to preserve function.  4. Obesity, Current BMI 34.3 Juvencio is currently in the action stage of change. As such, his goal is to continue with weight loss efforts. He has agreed to the Category 3 Plan.   Exercise goals: As is.   Behavioral modification strategies: increasing lean protein intake.  Pope has agreed to follow-up with our clinic in 5 weeks. He was informed of the importance of frequent follow-up visits to maximize his success with intensive lifestyle modifications for his multiple health conditions.   Objective:   Blood pressure 138/71, pulse (!) 56, temperature 97.9 F (36.6 C), height '5\' 7"'$  (1.702 m), weight 213 lb (96.6 kg), SpO2 99 %. Body mass index is 33.36 kg/m.  General: Cooperative, alert, well developed, in no acute distress. HEENT: Conjunctivae and lids unremarkable. Cardiovascular: Regular rhythm.  Lungs: Normal work of breathing. Neurologic: No focal deficits.   Lab Results  Component Value Date   CREATININE 0.86 05/28/2022   BUN 20 05/28/2022   NA 136 05/28/2022   K 4.0 05/28/2022   CL 102 05/28/2022   CO2 20 05/28/2022   Lab Results  Component Value Date   ALT 60 (H) 01/29/2022   AST 47 (H) 01/29/2022   ALKPHOS 65 01/29/2022   BILITOT 1.3 (H) 01/29/2022   Lab Results  Component Value Date   HGBA1C 5.3 01/29/2022   HGBA1C 5.6 09/30/2021   HGBA1C 5.5 03/15/2021   HGBA1C 5.4 09/20/2020   HGBA1C 5.4 06/08/2020   Lab Results  Component Value Date   INSULIN 10.3 01/29/2022   Lab Results  Component Value Date   TSH 1.810 01/29/2022   Lab Results  Component Value Date   CHOL 127 01/29/2022   HDL 44 01/29/2022   LDLCALC 70 01/29/2022   LDLDIRECT  168.0 05/13/2016   TRIG 63 01/29/2022   CHOLHDL 3 09/30/2021   Lab Results  Component Value Date   VD25OH 35.6 01/29/2022   VD25OH 33.70 09/20/2020   Lab Results  Component Value Date   WBC 4.1 09/30/2021   HGB 16.3 09/30/2021   HCT 48.3 09/30/2021   MCV 90.3 09/30/2021   PLT 162.0 09/30/2021   No results found for: "IRON", "TIBC", "FERRITIN"  Attestation Statements:   Reviewed by clinician on day of visit: allergies, medications, problem list, medical history, surgical history, family history, social history, and previous encounter notes.  Time spent on visit including pre-visit chart review and post-visit care and charting was 40 minutes.   I, Trixie Dredge, am acting as transcriptionist for Dennard Nip, MD.  I have reviewed the above documentation for accuracy and completeness, and I agree with the above. -  Dennard Nip, MD

## 2022-07-09 ENCOUNTER — Other Ambulatory Visit: Payer: Self-pay | Admitting: Podiatry

## 2022-07-09 NOTE — Telephone Encounter (Signed)
Please advise 

## 2022-07-16 ENCOUNTER — Ambulatory Visit (INDEPENDENT_AMBULATORY_CARE_PROVIDER_SITE_OTHER): Payer: 59 | Admitting: Family Medicine

## 2022-07-16 ENCOUNTER — Encounter (INDEPENDENT_AMBULATORY_CARE_PROVIDER_SITE_OTHER): Payer: Self-pay | Admitting: Family Medicine

## 2022-07-16 VITALS — BP 135/74 | HR 65 | Temp 98.0°F | Ht 67.0 in | Wt 212.0 lb

## 2022-07-16 DIAGNOSIS — Z6833 Body mass index (BMI) 33.0-33.9, adult: Secondary | ICD-10-CM

## 2022-07-16 DIAGNOSIS — Z7984 Long term (current) use of oral hypoglycemic drugs: Secondary | ICD-10-CM

## 2022-07-16 DIAGNOSIS — E669 Obesity, unspecified: Secondary | ICD-10-CM

## 2022-07-16 DIAGNOSIS — E1169 Type 2 diabetes mellitus with other specified complication: Secondary | ICD-10-CM

## 2022-07-19 ENCOUNTER — Other Ambulatory Visit: Payer: Self-pay | Admitting: Family Medicine

## 2022-07-19 DIAGNOSIS — I10 Essential (primary) hypertension: Secondary | ICD-10-CM

## 2022-07-28 NOTE — Progress Notes (Signed)
Chief Complaint:   OBESITY Corliss is here to discuss his progress with his obesity treatment plan along with follow-up of his obesity related diagnoses. Engelbert is on the Category 3 Plan and states he is following his eating plan approximately 50% of the time. Sriyan states he is doing cardio and strengthening for 45 minutes 3 times per week.  Today's visit was #: 7 Starting weight: 232 lbs Starting date: 01/28/2022 Today's weight: 212 lbs Today's date: 07/16/2022 Total lbs lost to date: 20 Total lbs lost since last in-office visit: 1  Interim History: Czar has done well with avoiding holiday weight gain. He is wondering if there are other eating plans that he could look at.   Subjective:   1. Type 2 diabetes mellitus with other specified complication, without long-term current use of insulin (HCC) Attilio is on [place] and he is working on his diet. He has no signs of hypoglycemia. He is trying to control with his diet and weight loss.   Assessment/Plan:   1. Type 2 diabetes mellitus with other specified complication, without long-term current use of insulin (HCC) Manish will continue with his diet and he was instructed to watch for signs of hypoglycemia and hypotension, which can happen with his current medications.   2. Obesity, Current BMI 33.3 Yoshiaki is currently in the action stage of change. As such, his goal is to continue with weight loss efforts. He has agreed to the Category 3 Plan or keeping a food journal and adhering to recommended goals of 1500-1800 calories and 100+ grams of protein daily.   Journaling was discussed and handout was given. Eating Out strategies and Recipes were given.   Exercise goals: As is.   Behavioral modification strategies: increasing lean protein intake and keeping a strict food journal.  Mattie has agreed to follow-up with our clinic in 4 weeks. He was informed of the importance of frequent follow-up visits to maximize his success with intensive lifestyle  modifications for his multiple health conditions.   Objective:   Blood pressure 135/74, pulse 65, temperature 98 F (36.7 C), height '5\' 7"'$  (1.702 m), weight 212 lb (96.2 kg), SpO2 98 %. Body mass index is 33.2 kg/m.  General: Cooperative, alert, well developed, in no acute distress. HEENT: Conjunctivae and lids unremarkable. Cardiovascular: Regular rhythm.  Lungs: Normal work of breathing. Neurologic: No focal deficits.   Lab Results  Component Value Date   CREATININE 0.86 05/28/2022   BUN 20 05/28/2022   NA 136 05/28/2022   K 4.0 05/28/2022   CL 102 05/28/2022   CO2 20 05/28/2022   Lab Results  Component Value Date   ALT 60 (H) 01/29/2022   AST 47 (H) 01/29/2022   ALKPHOS 65 01/29/2022   BILITOT 1.3 (H) 01/29/2022   Lab Results  Component Value Date   HGBA1C 5.3 01/29/2022   HGBA1C 5.6 09/30/2021   HGBA1C 5.5 03/15/2021   HGBA1C 5.4 09/20/2020   HGBA1C 5.4 06/08/2020   Lab Results  Component Value Date   INSULIN 10.3 01/29/2022   Lab Results  Component Value Date   TSH 1.810 01/29/2022   Lab Results  Component Value Date   CHOL 127 01/29/2022   HDL 44 01/29/2022   LDLCALC 70 01/29/2022   LDLDIRECT 168.0 05/13/2016   TRIG 63 01/29/2022   CHOLHDL 3 09/30/2021   Lab Results  Component Value Date   VD25OH 35.6 01/29/2022   VD25OH 33.70 09/20/2020   Lab Results  Component Value Date  WBC 4.1 09/30/2021   HGB 16.3 09/30/2021   HCT 48.3 09/30/2021   MCV 90.3 09/30/2021   PLT 162.0 09/30/2021   No results found for: "IRON", "TIBC", "FERRITIN"  Attestation Statements:   Reviewed by clinician on day of visit: allergies, medications, problem list, medical history, surgical history, family history, social history, and previous encounter notes.  Time spent on visit including pre-visit chart review and post-visit care and charting was 30 minutes.   I, Trixie Dredge, am acting as transcriptionist for Dennard Nip, MD.  I have reviewed the above  documentation for accuracy and completeness, and I agree with the above. -  Dennard Nip, MD

## 2022-07-30 DIAGNOSIS — L218 Other seborrheic dermatitis: Secondary | ICD-10-CM | POA: Diagnosis not present

## 2022-08-01 NOTE — Progress Notes (Unsigned)
No chief complaint on file.  History of Present Illness: 62 yo male with history of DM, GERD, HTN, HLD and obesity who is here today for follow up. I saw him in October 2023 as a new patient for the evaluation of bradycardia. He has long standing bradycardia. EKG from July 2023 with sinus brady, heart rate of 56 bpm. At his first visit here in October 2023 he had no complaints. He is very active. He plays golf and work out. His Apple Watch shows that his heart is in the 50s at night. EKG in 2009 with HR of 60 bpm. Echo 05/23/22 with LVEF=35-40%. Normal RV function. Trivial MR. Coronary CTA 06/12/22 with minimal LAD plaque, calcium score 3.4.   He is here today for follow up. The patient denies any chest pain, dyspnea, palpitations, lower extremity edema, orthopnea, PND, dizziness, near syncope or syncope.   Primary Care Physician: [name], MD  Referring: Leafy Ro  Past Medical History:  Diagnosis Date   Allergic rhinitis    Allergy    Arthritis    Diabetes mellitus without complication [facility])    ED (erectile dysfunction)    GERD (gastroesophageal reflux disease)    Glaucoma    High cholesterol    Hyperlipidemia    Hypertension    Impaired glucose tolerance    Nephrolithiasis    Obesity     Past Surgical History:  Procedure Laterality Date   COLONOSCOPY     difficultity with intabation     GYNECOMASTIA EXCISION Bilateral    POLYPECTOMY     right achilles tendon surgery      Current Outpatient Medications  Medication Sig Dispense Refill   alfuzosin (UROXATRAL) 10 MG 24 hr tablet Take 10 mg by mouth daily.     amLODipine (NORVASC) 10 MG tablet TAKE 1 TABLET BY MOUTH EVERY DAY 90 tablet 1   atorvastatin (LIPITOR) 40 MG tablet Take 1 tablet (40 mg total) by mouth daily. 90 tablet 3   benazepril (LOTENSIN) 40 MG tablet Take 1 tablet (40 mg total) by mouth daily. 90 tablet 3   Cyanocobalamin (VITAMIN B-12 PO) Take by mouth.     ELDERBERRY PO Take by mouth.      empagliflozin (JARDIANCE) 25 MG TABS tablet Take 1 tablet (25 mg total) by mouth daily. 90 tablet 3   latanoprost (XALATAN) 0.005 % ophthalmic solution      meloxicam (MOBIC) 15 MG tablet TAKE 1 TABLET (15 MG TOTAL) BY MOUTH DAILY. 30 tablet 3   metoprolol succinate (TOPROL-XL) 100 MG 24 hr tablet TAKE WITH OR IMMEDIATELY FOLLOWING A MEAL. 90 tablet 3   Multiple Vitamin (MULTIVITAMIN ADULT PO) Take by mouth.     sildenafil (VIAGRA) 100 MG tablet Take 1 tablet (100 mg total) by mouth daily as needed for erectile dysfunction. 30 tablet 6   tadalafil (CIALIS) 5 MG tablet Take 5 mg by mouth daily as needed for erectile dysfunction.     timolol (TIMOPTIC) 0.5 % ophthalmic solution 1 drop daily.     VITAMIN D PO Take by mouth.     No current facility-administered medications for this visit.    No Known Allergies  Social History   Socioeconomic History   Marital status: Married    Spouse name: Not on file   Number of children: 2   Years of education: Not on file   Highest education level: Not on file  Occupational History   Occupation: Retired supply Nature conservation officer  Tobacco Use  Smoking status: Never   Smokeless tobacco: Never  Substance and Sexual Activity   Alcohol use: Yes    Alcohol/week: 0.0 standard drinks of alcohol    Comment: occasionally   Drug use: No   Sexual activity: Not on file  Other Topics Concern   Not on file  Social History Narrative   Not on file   Social Determinants of Health   Financial Resource Strain: Not on file  Food Insecurity: Not on file  Transportation Needs: Not on file  Physical Activity: Not on file  Stress: Not on file  Social Connections: Not on file  Intimate Partner Violence: Not on file    Family History  Problem Relation Age of Onset   Hypertension Mother    Heart disease Mother        Pacemaker   Obesity Father    Diabetes Father    Hypertension Father    Colon cancer Father 16   Colon polyps Father    High Cholesterol  Father    Cancer Father    Healthy Sister    Hypertension Brother    Colon polyps Brother    Hypertension Brother    Hypertension Maternal Grandmother    Diabetes Other    Hypertension Other    Breast cancer Other        breast, colon    Stomach cancer Other        paternal great grand father   Esophageal cancer Neg Hx    Rectal cancer Neg Hx     Review of Systems:  As stated in the HPI and otherwise negative.   There were no vitals taken for this visit.  Physical Examination: General: Well developed, well nourished, NAD  HEENT: OP clear, mucus membranes moist  SKIN: warm, dry. No rashes. Neuro: No focal deficits  Musculoskeletal: Muscle strength 5/5 all ext  Psychiatric: Mood and affect normal  Neck: No JVD, no carotid bruits, no thyromegaly, no lymphadenopathy.  Lungs:Clear bilaterally, no wheezes, rhonci, crackles Cardiovascular: Regular rate and rhythm. No murmurs, gallops or rubs. Abdomen:Soft. Bowel sounds present. Non-tender.  Extremities: No lower extremity edema. Pulses are 2 + in the bilateral DP/PT.  EKG:  EKG is *** ordered today. The ekg ordered today demonstrates   Echo 05/23/22:  1. Left ventricular ejection fraction, by estimation, is 35 to 40%. The  left ventricle has moderately decreased function. The left ventricle  demonstrates global hypokinesis and regionally worse function in the  inferior wall base and mid, as well as the  anterior and lateral apex. The left ventricular internal cavity size was  mildly dilated. There is mild left ventricular hypertrophy. Left  ventricular diastolic parameters are indeterminate. Elevated left  ventricular end-diastolic pressure.   2. Right ventricular systolic function is normal. The right ventricular  size is normal. There is normal pulmonary artery systolic pressure. The  estimated right ventricular systolic pressure is 03.5 mmHg.   3. Right atrial size was mildly dilated.   4. The mitral valve is normal in  structure. Trivial mitral valve  regurgitation. No evidence of mitral stenosis.   5. The aortic valve is tricuspid. Aortic valve regurgitation is not  visualized. No aortic stenosis is present.   6. The inferior vena cava is normal in size with greater than 50%  respiratory variability, suggesting right atrial pressure of 3 mmHg.   Recent Labs: 09/30/2021: Hemoglobin 16.3; Platelets 162.0 01/29/2022: ALT 60; TSH 1.810 05/28/2022: BUN 20; Creatinine, Ser 0.86; Potassium 4.0; Sodium 136  Lipid Panel    Component Value Date/Time   CHOL 127 01/29/2022 0815   TRIG 63 01/29/2022 0815   HDL 44 01/29/2022 0815   CHOLHDL 3 09/30/2021 1020   VLDL 17.2 09/30/2021 1020   LDLCALC 70 01/29/2022 0815   LDLDIRECT 168.0 05/13/2016 0810     Wt Readings from Last 3 Encounters:  07/16/22 96.2 kg  06/11/22 96.6 kg  05/06/22 99.3 kg    Assessment and Plan:   1. Sinus bradycardia: He is asymptomatic. No changes.    2. Non-ischemic Cardiomyopathy: LVEF=35-40% by echo in November 2023. Continue medical therapy with Benazepril and Toprol.   3. CAD without angina: Mild LAD plaque on coronary CTA December 2024. Continue statin. Start ASA 81 mg daily.   Labs/ tests ordered today include:   No orders of the defined types were placed in this encounter.  Disposition:   F/U with me in two years  Signed, Lauree Chandler, MD, [facility] 08/01/2022 1:19 PM    [facility] [address], [place], [place]  [address] Phone: [phone]; Fax: [phone]

## 2022-08-04 ENCOUNTER — Ambulatory Visit: Payer: 59 | Attending: Cardiovascular Disease | Admitting: Cardiovascular Disease

## 2022-08-04 ENCOUNTER — Encounter: Payer: Self-pay | Admitting: Cardiovascular Disease

## 2022-08-04 VITALS — BP 124/66 | HR 53 | Ht 67.0 in | Wt 220.4 lb

## 2022-08-04 DIAGNOSIS — I428 Other cardiomyopathies: Secondary | ICD-10-CM | POA: Diagnosis not present

## 2022-08-04 DIAGNOSIS — I2510 Atherosclerotic heart disease of native coronary artery without angina pectoris: Secondary | ICD-10-CM | POA: Diagnosis not present

## 2022-08-04 DIAGNOSIS — R001 Bradycardia, unspecified: Secondary | ICD-10-CM

## 2022-08-04 NOTE — Patient Instructions (Signed)
Medication Instructions:  No changes *If you need a refill on your cardiac medications before your next appointment, please call your pharmacy*   Lab Work: none If you have labs (blood work) drawn today and your tests are completely normal, you will receive your results only by: [place] (if you have MyChart) OR A paper copy in the mail If you have any lab test that is abnormal or we need to change your treatment, we will call you to review the results.   Testing/Procedures: none   Follow-Up: At [facility], you and your health needs are our priority.  As part of our continuing mission to provide you with exceptional heart care, we have created designated Provider Care Teams.  These Care Teams include your primary Cardiologist (physician) and Advanced Practice Providers (APPs -  Physician Assistants and Nurse Practitioners) who all work together to provide you with the care you need, when you need it.   Your next appointment:   12 month(s)  Provider:   Lauree Chandler, MD     Other Instructions

## 2022-08-06 ENCOUNTER — Other Ambulatory Visit: Payer: Self-pay | Admitting: Family Medicine

## 2022-08-06 DIAGNOSIS — I10 Essential (primary) hypertension: Secondary | ICD-10-CM

## 2022-08-19 ENCOUNTER — Encounter (INDEPENDENT_AMBULATORY_CARE_PROVIDER_SITE_OTHER): Payer: Self-pay | Admitting: Family Medicine

## 2022-08-19 ENCOUNTER — Ambulatory Visit (INDEPENDENT_AMBULATORY_CARE_PROVIDER_SITE_OTHER): Payer: 59 | Admitting: Family Medicine

## 2022-08-19 VITALS — BP 145/78 | HR 57 | Temp 98.1°F | Ht 67.0 in | Wt 218.0 lb

## 2022-08-19 DIAGNOSIS — Z7984 Long term (current) use of oral hypoglycemic drugs: Secondary | ICD-10-CM

## 2022-08-19 DIAGNOSIS — Z6833 Body mass index (BMI) 33.0-33.9, adult: Secondary | ICD-10-CM | POA: Insufficient documentation

## 2022-08-19 DIAGNOSIS — E669 Obesity, unspecified: Secondary | ICD-10-CM | POA: Insufficient documentation

## 2022-08-19 DIAGNOSIS — Z6834 Body mass index (BMI) 34.0-34.9, adult: Secondary | ICD-10-CM

## 2022-08-19 DIAGNOSIS — Z6836 Body mass index (BMI) 36.0-36.9, adult: Secondary | ICD-10-CM | POA: Insufficient documentation

## 2022-08-19 DIAGNOSIS — E1169 Type 2 diabetes mellitus with other specified complication: Secondary | ICD-10-CM | POA: Diagnosis not present

## 2022-08-19 DIAGNOSIS — I10 Essential (primary) hypertension: Secondary | ICD-10-CM

## 2022-08-21 ENCOUNTER — Emergency Department: Payer: 59

## 2022-08-21 ENCOUNTER — Encounter: Payer: Self-pay | Admitting: Emergency Medicine

## 2022-08-21 ENCOUNTER — Other Ambulatory Visit: Payer: Self-pay

## 2022-08-21 ENCOUNTER — Observation Stay
Admission: EM | Admit: 2022-08-21 | Discharge: 2022-08-22 | Disposition: A | Discharge status: Home / Self Care | Payer: 59 | Attending: Hospitalist | Admitting: Hospitalist

## 2022-08-21 DIAGNOSIS — E669 Obesity, unspecified: Secondary | ICD-10-CM | POA: Diagnosis present

## 2022-08-21 DIAGNOSIS — I214 Non-ST elevation (NSTEMI) myocardial infarction: Secondary | ICD-10-CM | POA: Diagnosis not present

## 2022-08-21 DIAGNOSIS — I428 Other cardiomyopathies: Secondary | ICD-10-CM

## 2022-08-21 DIAGNOSIS — I2510 Atherosclerotic heart disease of native coronary artery without angina pectoris: Secondary | ICD-10-CM | POA: Diagnosis not present

## 2022-08-21 DIAGNOSIS — I255 Ischemic cardiomyopathy: Secondary | ICD-10-CM | POA: Insufficient documentation

## 2022-08-21 DIAGNOSIS — R197 Diarrhea, unspecified: Secondary | ICD-10-CM | POA: Diagnosis not present

## 2022-08-21 DIAGNOSIS — I5022 Chronic systolic (congestive) heart failure: Secondary | ICD-10-CM | POA: Insufficient documentation

## 2022-08-21 DIAGNOSIS — R1084 Generalized abdominal pain: Secondary | ICD-10-CM | POA: Diagnosis not present

## 2022-08-21 DIAGNOSIS — I10 Essential (primary) hypertension: Secondary | ICD-10-CM | POA: Diagnosis present

## 2022-08-21 DIAGNOSIS — R55 Syncope and collapse: Secondary | ICD-10-CM

## 2022-08-21 DIAGNOSIS — R1111 Vomiting without nausea: Secondary | ICD-10-CM | POA: Diagnosis not present

## 2022-08-21 DIAGNOSIS — R0902 Hypoxemia: Secondary | ICD-10-CM | POA: Diagnosis not present

## 2022-08-21 DIAGNOSIS — I249 Acute ischemic heart disease, unspecified: Secondary | ICD-10-CM | POA: Diagnosis not present

## 2022-08-21 DIAGNOSIS — I110 Hypertensive heart disease with heart failure: Secondary | ICD-10-CM | POA: Insufficient documentation

## 2022-08-21 DIAGNOSIS — K529 Noninfective gastroenteritis and colitis, unspecified: Secondary | ICD-10-CM | POA: Insufficient documentation

## 2022-08-21 DIAGNOSIS — R069 Unspecified abnormalities of breathing: Secondary | ICD-10-CM | POA: Diagnosis not present

## 2022-08-21 DIAGNOSIS — R112 Nausea with vomiting, unspecified: Secondary | ICD-10-CM | POA: Diagnosis not present

## 2022-08-21 DIAGNOSIS — R7989 Other specified abnormal findings of blood chemistry: Secondary | ICD-10-CM | POA: Diagnosis not present

## 2022-08-21 DIAGNOSIS — R0602 Shortness of breath: Secondary | ICD-10-CM | POA: Diagnosis not present

## 2022-08-21 DIAGNOSIS — E119 Type 2 diabetes mellitus without complications: Secondary | ICD-10-CM

## 2022-08-21 DIAGNOSIS — R1011 Right upper quadrant pain: Secondary | ICD-10-CM | POA: Diagnosis not present

## 2022-08-21 DIAGNOSIS — R1010 Upper abdominal pain, unspecified: Secondary | ICD-10-CM | POA: Diagnosis not present

## 2022-08-21 DIAGNOSIS — R140 Abdominal distension (gaseous): Secondary | ICD-10-CM | POA: Diagnosis not present

## 2022-08-21 DIAGNOSIS — Z79899 Other long term (current) drug therapy: Secondary | ICD-10-CM | POA: Insufficient documentation

## 2022-08-21 DIAGNOSIS — Z7982 Long term (current) use of aspirin: Secondary | ICD-10-CM | POA: Diagnosis not present

## 2022-08-21 DIAGNOSIS — K219 Gastro-esophageal reflux disease without esophagitis: Secondary | ICD-10-CM | POA: Diagnosis present

## 2022-08-21 DIAGNOSIS — N281 Cyst of kidney, acquired: Secondary | ICD-10-CM | POA: Diagnosis not present

## 2022-08-21 DIAGNOSIS — R0789 Other chest pain: Secondary | ICD-10-CM | POA: Diagnosis not present

## 2022-08-21 LAB — COMPREHENSIVE METABOLIC PANEL
ALT: 20 U/L (ref 0–44)
AST: 24 U/L (ref 15–41)
Albumin: 4.1 g/dL (ref 3.5–5.0)
Alkaline Phosphatase: 50 U/L (ref 38–126)
Anion gap: 9 (ref 5–15)
BUN: 24 mg/dL — ABNORMAL HIGH (ref 8–23)
CO2: 22 mmol/L (ref 22–32)
Calcium: 9.5 mg/dL (ref 8.9–10.3)
Chloride: 104 mmol/L (ref 98–111)
Creatinine, Ser: 1.06 mg/dL (ref 0.61–1.24)
GFR, Estimated: >60 mL/min (ref >60)
Glucose, Bld: 178 mg/dL — ABNORMAL HIGH (ref 70–99)
Potassium: 3.5 mmol/L (ref 3.5–5.1)
Sodium: 135 mmol/L (ref 135–145)
Total Bilirubin: 1.6 mg/dL — ABNORMAL HIGH (ref 0.3–1.2)
Total Protein: 7.1 g/dL (ref 6.5–8.1)

## 2022-08-21 LAB — CBC
HCT: 45.3 % (ref 39.0–52.0)
Hemoglobin: 15.5 g/dL (ref 13.0–17.0)
MCH: 30.7 pg (ref 26.0–34.0)
MCHC: 34.2 g/dL (ref 30.0–36.0)
MCV: 89.7 fL (ref 80.0–100.0)
Platelets: 173 10*3/uL (ref 150–400)
RBC: 5.05 MIL/uL (ref 4.22–5.81)
RDW: 13.8 % (ref 11.5–15.5)
WBC: 6 10*3/uL (ref 4.0–10.5)
nRBC: 0 % (ref 0.0–0.2)

## 2022-08-21 LAB — URINALYSIS, ROUTINE W REFLEX MICROSCOPIC
Bacteria, UA: NONE SEEN
Bilirubin Urine: NEGATIVE
Glucose, UA: >=500 mg/dL — AB
Ketones, ur: NEGATIVE mg/dL
Leukocytes,Ua: NEGATIVE
Nitrite: NEGATIVE
Protein, ur: >=300 mg/dL — AB
Specific Gravity, Urine: 1.02 (ref 1.005–1.030)
pH: 5 (ref 5.0–8.0)

## 2022-08-21 LAB — TROPONIN I (HIGH SENSITIVITY)
Troponin I (High Sensitivity): 12 ng/L (ref <18)
Troponin I (High Sensitivity): 54 ng/L — ABNORMAL HIGH (ref <18)
Troponin I (High Sensitivity): 41 ng/L — ABNORMAL HIGH (ref <18)
Troponin I (High Sensitivity): 37 ng/L — ABNORMAL HIGH (ref <18)

## 2022-08-21 LAB — CBG MONITORING, ED
Glucose-Capillary: 173 mg/dL — ABNORMAL HIGH (ref 70–99)
Glucose-Capillary: 116 mg/dL — ABNORMAL HIGH (ref 70–99)

## 2022-08-21 LAB — MAGNESIUM: Magnesium: 2.4 mg/dL (ref 1.7–2.4)

## 2022-08-21 LAB — PROTIME-INR
INR: 1.2 (ref 0.8–1.2)
Prothrombin Time: 14.7 seconds (ref 11.4–15.2)

## 2022-08-21 LAB — APTT: aPTT: 24 seconds (ref 24–36)

## 2022-08-21 LAB — LIPASE, BLOOD: Lipase: 36 U/L (ref 11–51)

## 2022-08-21 MED ORDER — HEPARIN (PORCINE) 25000 UT/250ML-% IV SOLN
1200.0000 [IU]/h | INTRAVENOUS | Status: DC
  Administered 2022-08-21: 1200 [IU]/h via INTRAVENOUS
  Filled 2022-08-21: qty 250

## 2022-08-21 MED ORDER — NITROGLYCERIN 0.4 MG SL SUBL: 0.4000 mg | SUBLINGUAL_TABLET | SUBLINGUAL | Status: DC | PRN

## 2022-08-21 MED ORDER — ADULT MULTIVITAMIN W/MINERALS CH
1.0000 | ORAL_TABLET | Freq: Every day | ORAL | Status: DC
  Administered 2022-08-21 – 2022-08-22 (×2): 1 via ORAL
  Filled 2022-08-21 (×2): qty 1

## 2022-08-21 MED ORDER — PANTOPRAZOLE SODIUM 40 MG PO TBEC
40.0000 mg | DELAYED_RELEASE_TABLET | Freq: Every day | ORAL | Status: DC
  Administered 2022-08-22: 40 mg via ORAL
  Filled 2022-08-21 (×2): qty 1

## 2022-08-21 MED ORDER — ASPIRIN 81 MG PO CHEW
324.0000 mg | CHEWABLE_TABLET | Freq: Once | ORAL | Status: AC
  Administered 2022-08-21: 324 mg via ORAL
  Filled 2022-08-21: qty 4

## 2022-08-21 MED ORDER — ATORVASTATIN CALCIUM 20 MG PO TABS
40.0000 mg | ORAL_TABLET | Freq: Every day | ORAL | Status: DC
  Administered 2022-08-21 – 2022-08-22 (×2): 40 mg via ORAL
  Filled 2022-08-21 (×2): qty 2

## 2022-08-21 MED ORDER — ONDANSETRON HCL 4 MG/2ML IJ SOLN: 4.0000 mg | Freq: Four times a day (QID) | INTRAMUSCULAR | Status: DC | PRN

## 2022-08-21 MED ORDER — ALUM & MAG HYDROXIDE-SIMETH 200-200-20 MG/5ML PO SUSP
30.0000 mL | Freq: Once | ORAL | Status: AC
  Administered 2022-08-21: 30 mL via ORAL
  Filled 2022-08-21: qty 30

## 2022-08-21 MED ORDER — METOPROLOL TARTRATE 25 MG PO TABS
12.5000 mg | ORAL_TABLET | Freq: Two times a day (BID) | ORAL | Status: DC
  Administered 2022-08-21: 12.5 mg via ORAL
  Filled 2022-08-21: qty 1

## 2022-08-21 MED ORDER — HEPARIN BOLUS VIA INFUSION
4000.0000 [IU] | Freq: Once | INTRAVENOUS | Status: AC
  Administered 2022-08-21: 4000 [IU] via INTRAVENOUS
  Filled 2022-08-21: qty 4000

## 2022-08-21 MED ORDER — ASPIRIN 81 MG PO TBEC
81.0000 mg | DELAYED_RELEASE_TABLET | Freq: Every day | ORAL | Status: DC
  Administered 2022-08-22: 81 mg via ORAL
  Filled 2022-08-21: qty 1

## 2022-08-21 MED ORDER — ACETAMINOPHEN 325 MG PO TABS: 650.0000 mg | ORAL_TABLET | ORAL | Status: DC | PRN

## 2022-08-21 MED ORDER — TIMOLOL MALEATE 0.5 % OP SOLN: 1.0000 [drp] | Freq: Every day | OPHTHALMIC | Status: DC

## 2022-08-21 MED ORDER — ALFUZOSIN HCL ER 10 MG PO TB24
10.0000 mg | ORAL_TABLET | Freq: Every day | ORAL | Status: DC
  Administered 2022-08-21: 10 mg via ORAL
  Filled 2022-08-21 (×2): qty 1

## 2022-08-21 MED ORDER — PANTOPRAZOLE SODIUM 40 MG PO TBEC
40.0000 mg | DELAYED_RELEASE_TABLET | Freq: Once | ORAL | Status: AC
  Administered 2022-08-21: 40 mg via ORAL
  Filled 2022-08-21: qty 1

## 2022-08-21 MED ORDER — LATANOPROST 0.005 % OP SOLN: 1.0000 [drp] | Freq: Every day | OPHTHALMIC | Status: DC

## 2022-08-21 NOTE — H&P (Addendum)
History and Physical    Patient: Robert Mcguire [id number] DOB: [birth year] DOA: 08/21/2022 DOS: the patient was seen and examined on 08/21/2022 PCP: [name], MD  Patient coming from: Home  Chief Complaint:  Chief Complaint  Patient presents with   Abdominal Pain   Emesis   HPI: Robert Mcguire is a 62 y.o. male with medical history significant for type 2 diabetes mellitus on oral agents, GERD, hypertension, dyslipidemia, obesity who presents to the emergency room for evaluation of chest pain. Patient states that he woke up in the early hours of the morning feeling like he had to use the bathroom.  He developed pain in the lower sternal area which he describes as a burning sensation radiating up to his neck associated with nausea, shortness of breath and diaphoresis.  He several episodes of emesis with improvement in his symptoms.  He attributed his symptoms to indigestion.  He was brought into the ER by EMS for further evaluation. He denies having any fever, no chills, no dizziness, no lightheadedness, no abdominal pain, no urinary symptoms, no changes in his bowel habits, no blurred vision or focal deficit. Chart review shows that he had a coronary CTA.  (06/12/22) which showed minimal LAD plaque.  2D echocardiogram which was done in 11/23 showed an LVEF of 35 to 40%. Vital signs showed a heart rate of 59, blood pressure 92/57 While in the ER he had a 4 beat run of nonsustained V. tach. Labs show an uptrending troponin level 12 >> 54 Twelve-lead EKG shows normal sinus rhythm, left axis deviation, T wave inversions in the lateral leads He was started on a heparin drip and will be referred to observation status for further evaluation.   Review of Systems: As mentioned in the history of present illness. All other systems reviewed and are negative. Past Medical History:  Diagnosis Date   Allergic rhinitis    Allergy    Arthritis    Diabetes mellitus without complication (HCC)    ED  (erectile dysfunction)    GERD (gastroesophageal reflux disease)    Glaucoma    High cholesterol    Hyperlipidemia    Hypertension    Impaired glucose tolerance    Nephrolithiasis    Obesity    Past Surgical History:  Procedure Laterality Date   COLONOSCOPY     difficultity with intabation     GYNECOMASTIA EXCISION Bilateral    POLYPECTOMY     right achilles tendon surgery     Social History:  reports that he has never smoked. He has never used smokeless tobacco. He reports current alcohol use. He reports that he does not use drugs.  No Known Allergies  Family History  Problem Relation Age of Onset   Hypertension Mother    Heart disease Mother        Pacemaker   Obesity Father    Diabetes Father    Hypertension Father    Colon cancer Father 58   Colon polyps Father    High Cholesterol Father    Cancer Father    Healthy Sister    Hypertension Brother    Colon polyps Brother    Hypertension Brother    Hypertension Maternal Grandmother    Diabetes Other    Hypertension Other    Breast cancer Other        breast, colon    Stomach cancer Other        paternal great grand father   Esophageal cancer Neg  Hx    Rectal cancer Neg Hx     Prior to Admission medications   Medication Sig Start Date End Date Taking? Authorizing Provider  alfuzosin (UROXATRAL) 10 MG 24 hr tablet Take 10 mg by mouth daily. 07/11/21   [provider]  amLODipine (NORVASC) 10 MG tablet TAKE 1 TABLET BY MOUTH EVERY DAY 08/06/22   [name], MD  atorvastatin (LIPITOR) 40 MG tablet Take 1 tablet (40 mg total) by mouth daily. 09/30/21   [name], MD  benazepril (LOTENSIN) 40 MG tablet Take 1 tablet (40 mg total) by mouth daily. 09/30/21   [name], MD  Cyanocobalamin (VITAMIN B-12 PO) Take by mouth.    [provider]  ELDERBERRY PO Take by mouth.    [provider]  empagliflozin (JARDIANCE) 25 MG TABS tablet Take 1 tablet (25 mg total) by mouth daily.  09/30/21   [name], MD  latanoprost (XALATAN) 0.005 % ophthalmic solution  01/03/16   [provider]  meloxicam (MOBIC) 15 MG tablet TAKE 1 TABLET (15 MG TOTAL) BY MOUTH DAILY. 07/09/22   Hyatt, Max T, DPM  metoprolol succinate (TOPROL-XL) 100 MG 24 hr tablet TAKE WITH OR IMMEDIATELY FOLLOWING A MEAL. 07/21/22   [name], MD  Multiple Vitamin (MULTIVITAMIN ADULT PO) Take by mouth.    [provider]  sildenafil (VIAGRA) 100 MG tablet Take 1 tablet (100 mg total) by mouth daily as needed for erectile dysfunction. 06/08/20   [name], MD  tadalafil (CIALIS) 5 MG tablet Take 5 mg by mouth daily as needed for erectile dysfunction.    [provider]  timolol (TIMOPTIC) 0.5 % ophthalmic solution 1 drop daily. 07/02/21   [provider]  VITAMIN D PO Take by mouth.    [provider]    Physical Exam: Vitals:   08/21/22 0856 08/21/22 0941 08/21/22 0945 08/21/22 1004  BP:  121/69  124/64  Pulse: 65 72 72 60  Resp: 18 14 19   $ Temp:      TempSrc:      SpO2: 98% 100% 98%   Weight:      Height:       Physical Exam Vitals and nursing note reviewed.  Constitutional:      Appearance: He is well-developed.  HENT:     Mouth/Throat:     Mouth: Mucous membranes are moist.  Cardiovascular:     Rate and Rhythm: Bradycardia present.  Pulmonary:     Effort: Pulmonary effort is normal.     Breath sounds: Normal breath sounds.  Abdominal:     General: Abdomen is flat. Bowel sounds are normal.     Palpations: Abdomen is soft.  Skin:    General: Skin is warm and dry.  Neurological:     General: No focal deficit present.     Mental Status: He is alert.  Psychiatric:        Mood and Affect: Mood normal.        Behavior: Behavior normal.     Data Reviewed: Relevant notes from primary care and specialist visits, past discharge summaries as available in EHR, including Care Everywhere. Prior diagnostic testing as pertinent to current  admission diagnoses Updated medications and problem lists for reconciliation ED course, including vitals, labs, imaging, treatment and response to treatment Triage notes, nursing and pharmacy notes and ED provider's notes Notable results as noted in HPI Labs reviewed.  Sodium 135, potassium 3.5, chloride 104, bicarb  22, glucose 178, BUN 24, creatinine 1.06, calcium 9.5, total protein 7.1, albumin 4.1, AST 24, ALT 20, alkaline phosphatase 50, total bilirubin 1.6 lipase 36, PT 14.7, INR 1.2, magnesium 2.4, white count 6.0, hemoglobin 15.5, hematocrit 45.3, platelet 173 Chest x-ray reviewed by me shows no evidence of acute cardiopulmonary disease Abdominal ultrasound was negative  There are no new results to review at this time.  Assessment and Plan: * ACS (acute coronary syndrome) [facility]) Patient presents for evaluation of chest pain that woke him up out of sleep associated with shortness of breath, diaphoresis, nausea and emesis. He is currently chest pain-free but has an uptrending troponin level Risk factors include diabetes mellitus and hypertension Will cycle cardiac enzymes Continue aspirin, atorvastatin and beta-blockers Continue heparin drip initiated in the ER Consult cardiology  Diabetes mellitus ([place]) Last A1c from 07/23 was 5.3 Place patient on consistent carbohydrate diet Check blood sugars with meals  GERD Place patient on PPI  Essential hypertension Patient has a history of hypertension Hold amlodipine and benazepril since he is normotensive Decrease dose of metoprolol to 12.5 mg twice daily  Obesity BMI [id number] Complicates overall prognosis and care Lifestyle modification and exercise has been discussed with patient in detail      Advance Care Planning:   Code Status: Full Code   Consults: Cardiology  Family Communication: Greater than 50% of time was spent discussing patient's condition and plan of care with him and his wife at the bedside.  All questions and  concerns have been addressed.  They verbalized understanding and agree with the plan.  Severity of Illness: The appropriate patient status for this patient is OBSERVATION. Observation status is judged to be reasonable and necessary in order to provide the required intensity of service to ensure the patient's safety. The patient's presenting symptoms, physical exam findings, and initial radiographic and laboratory data in the context of their medical condition is felt to place them at decreased risk for further clinical deterioration. Furthermore, it is anticipated that the patient will be medically stable for discharge from the hospital within 2 midnights of admission.   Author: Collier Bullock, MD 08/21/2022 10:14 AM  For on call review [email or web address].

## 2022-08-21 NOTE — ED Notes (Signed)
Pt's BG 173; however, this was taken soon after pt finished full breakfast tray.

## 2022-08-21 NOTE — ED Notes (Signed)
Pt requesting his Jardiance; attending notified via secure chat; pt states told pharm tech about it; attending replied pt will need to have family bring it in from home as is non-formulary; pt agreeable to have family bring it in.

## 2022-08-21 NOTE — Assessment & Plan Note (Addendum)
Last A1c from 07/23 was 5.3 Place patient on consistent carbohydrate diet Check blood sugars with meals

## 2022-08-21 NOTE — ED Provider Notes (Addendum)
[facility] Provider Note    Event Date/Time   First MD Initiated Contact with Patient 08/21/22 [phone]     (approximate)   History   Abdominal Pain and Emesis   HPI  Robert Mcguire is a 62 y.o. male with history of hypertension, hyperlipidemia, diabetes who presents to the emergency department with shortness of breath, burning chest pain, upper abdominal pain that he describes as a pressure that started tonight.  States he vomited several times and shortness of breath has resolved and pain has improved.  Received Zofran in triage and states nausea has resolved.  No bloody stools or melena but did have some diarrhea.  No fevers or cough.  No urinary symptoms.  No previous abdominal surgery.   History provided by patient, wife.    Past Medical History:  Diagnosis Date   Allergic rhinitis    Allergy    Arthritis    Diabetes mellitus without complication (HCC)    ED (erectile dysfunction)    GERD (gastroesophageal reflux disease)    Glaucoma    High cholesterol    Hyperlipidemia    Hypertension    Impaired glucose tolerance    Nephrolithiasis    Obesity     Past Surgical History:  Procedure Laterality Date   COLONOSCOPY     difficultity with intabation     GYNECOMASTIA EXCISION Bilateral    POLYPECTOMY     right achilles tendon surgery      MEDICATIONS:  Prior to Admission medications   Medication Sig Start Date End Date Taking? Authorizing Provider  alfuzosin (UROXATRAL) 10 MG 24 hr tablet Take 10 mg by mouth daily. 07/11/21   [provider]  amLODipine (NORVASC) 10 MG tablet TAKE 1 TABLET BY MOUTH EVERY DAY 08/06/22   [name], MD  atorvastatin (LIPITOR) 40 MG tablet Take 1 tablet (40 mg total) by mouth daily. 09/30/21   [name], MD  benazepril (LOTENSIN) 40 MG tablet Take 1 tablet (40 mg total) by mouth daily. 09/30/21   [name], MD  Cyanocobalamin (VITAMIN B-12 PO) Take by mouth.    [provider]   ELDERBERRY PO Take by mouth.    [provider]  empagliflozin (JARDIANCE) 25 MG TABS tablet Take 1 tablet (25 mg total) by mouth daily. 09/30/21   [name], MD  latanoprost (XALATAN) 0.005 % ophthalmic solution  01/03/16   [provider]  meloxicam (MOBIC) 15 MG tablet TAKE 1 TABLET (15 MG TOTAL) BY MOUTH DAILY. 07/09/22   Hyatt, Max T, DPM  metoprolol succinate (TOPROL-XL) 100 MG 24 hr tablet TAKE WITH OR IMMEDIATELY FOLLOWING A MEAL. 07/21/22   [name], MD  Multiple Vitamin (MULTIVITAMIN ADULT PO) Take by mouth.    [provider]  sildenafil (VIAGRA) 100 MG tablet Take 1 tablet (100 mg total) by mouth daily as needed for erectile dysfunction. 06/08/20   [name], MD  tadalafil (CIALIS) 5 MG tablet Take 5 mg by mouth daily as needed for erectile dysfunction.    [provider]  timolol (TIMOPTIC) 0.5 % ophthalmic solution 1 drop daily. 07/02/21   [provider]  VITAMIN D PO Take by mouth.    [provider]    Physical Exam   Triage Vital Signs: ED Triage Vitals  Enc Vitals Group     BP 08/21/22 0257 (!) 92/57     Pulse Rate 08/21/22 0257 (!) 59     Resp  08/21/22 0257 16     Temp 08/21/22 0257 97.7 F (36.5 C)     Temp Source 08/21/22 0257 Oral     SpO2 08/21/22 0257 100 %     Weight 08/21/22 0258 216 lb (98 kg)     Height 08/21/22 0258 5' 7"$  (1.702 m)     Head Circumference --      Peak Flow --      Pain Score 08/21/22 0258 4     Pain Loc --      Pain Edu? --      Excl. in [place]? --     Most recent vital signs: Vitals:   08/21/22 0257 08/21/22 0613  BP: (!) 92/57 125/60  Pulse: (!) 59 (!) 56  Resp: 16 18  Temp: 97.7 F (36.5 C) 98 F (36.7 C)  SpO2: 100% 99%    CONSTITUTIONAL: Alert, responds appropriately to questions. Well-appearing; well-nourished HEAD: Normocephalic, atraumatic EYES: Conjunctivae clear, pupils appear equal, sclera nonicteric ENT: normal nose; moist mucous  membranes NECK: Supple, normal ROM CARD: RRR; S1 and S2 appreciated RESP: Normal chest excursion without splinting or tachypnea; breath sounds clear and equal bilaterally; no wheezes, no rhonchi, no rales, no hypoxia or respiratory distress, speaking full sentences ABD/GI: Non-distended; soft, ender to palpation in the upper abdomen, negative Murphy's sign, no tenderness at McBurney's point BACK: The back appears normal EXT: Normal ROM in all joints; no deformity noted, no edema, no calf tenderness or calf swelling SKIN: Normal color for age and race; warm; no rash on exposed skin NEURO: Moves all extremities equally, normal speech PSYCH: The patient's mood and manner are appropriate.   ED Results / Procedures / Treatments   LABS: (all labs ordered are listed, but only abnormal results are displayed) Labs Reviewed  COMPREHENSIVE METABOLIC PANEL - Abnormal; Notable for the following components:      Result Value   Glucose, Bld 178 (*)    BUN 24 (*)    Total Bilirubin 1.6 (*)    All other components within normal limits  URINALYSIS, ROUTINE W REFLEX MICROSCOPIC - Abnormal; Notable for the following components:   Color, Urine YELLOW (*)    APPearance CLOUDY (*)    Glucose, UA >=500 (*)    Hgb urine dipstick SMALL (*)    Protein, ur >=300 (*)    All other components within normal limits  TROPONIN I (HIGH SENSITIVITY) - Abnormal; Notable for the following components:   Troponin I (High Sensitivity) 54 (*)    All other components within normal limits  LIPASE, BLOOD  CBC  PROTIME-INR  APTT  MAGNESIUM  TROPONIN I (HIGH SENSITIVITY)     EKG:  EKG Interpretation  Date/Time:  Thursday August 21 2022 03:01:49 EST Ventricular Rate:  63 PR Interval:  202 QRS Duration: 102 QT Interval:  428 QTC Calculation: 437 R Axis:   -35 Text Interpretation: Normal sinus rhythm Left axis deviation Anterolateral infarct , age undetermined Abnormal ECG No previous ECGs available Confirmed by  Pryor Curia [phone]) on 08/21/2022 4:10:02 AM         RADIOLOGY: My personal review and interpretation of imaging: Ultrasound unremarkable.  Chest x-ray clear.  I have personally reviewed all radiology reports.   US ABDOMEN LIMITED RUQ (LIVER/GB)  Result Date: 08/21/2022 CLINICAL DATA:  Upper abdominal pain since 2 a.m. EXAM: ULTRASOUND ABDOMEN LIMITED RIGHT UPPER QUADRANT COMPARISON:  Abdominal CT 10/27/2013 FINDINGS: Gallbladder: Minimal distension of the gallbladder. No detected stone and no focal tenderness Common bile  duct: Diameter: 3 mm Liver: No focal lesion identified. Within normal limits in parenchymal echogenicity. Portal vein is patent on color Doppler imaging with normal direction of blood flow towards the liver. Other: 3.5 cm right renal cyst known from a 2015 abdominal CT. There is partial visualization due to shadowing rib. No imaging follow-up recommended. IMPRESSION: Negative right upper quadrant ultrasound Electronically Signed   By: Jorje Guild M.D.   On: 08/21/2022 05:39   DG Chest 2 View  Result Date: 08/21/2022 CLINICAL DATA:  Short of breath EXAM: CHEST - 2 VIEW COMPARISON:  Prior chest x-ray 05/04/2019 FINDINGS: Stable chronic bronchitic changes. Cardiac and mediastinal contours are within normal limits. No pleural effusion or pneumothorax. No acute osseous abnormality. IMPRESSION: No active cardiopulmonary disease. Electronically Signed   By: Jacqulynn Cadet M.D.   On: 08/21/2022 05:32     PROCEDURES:  Critical Care performed: Yes, see critical care procedure note(s)   CRITICAL CARE Performed by: Cyril Mourning Edi Gorniak   Total critical care time: 45 minutes  Critical care time was exclusive of separately billable procedures and treating other patients.  Critical care was necessary to treat or prevent imminent or life-threatening deterioration.  Critical care was time spent personally by me on the following activities: development of treatment plan with  patient and/or surrogate as well as nursing, discussions with consultants, evaluation of patient's response to treatment, examination of patient, obtaining history from patient or surrogate, ordering and performing treatments and interventions, ordering and review of laboratory studies, ordering and review of radiographic studies, pulse oximetry and re-evaluation of patient's condition.   Marland Kitchen1-3 Lead EKG Interpretation  Performed by: Velina Drollinger, Delice Bison, DO Authorized by: Savier Trickett, Delice Bison, DO     Interpretation: normal     ECG rate:  56   ECG rate assessment: normal     Rhythm: sinus bradycardia     Ectopy: none     Conduction: normal       IMPRESSION / MDM / ASSESSMENT AND PLAN / ED COURSE  I reviewed the triage vital signs and the nursing notes.    Patient here with upper abdominal pain, atypical chest pain, shortness of breath, vomiting and diarrhea.    DIFFERENTIAL DIAGNOSIS (includes but not limited to):   GERD, gastritis, esophageal spasm, esophagitis, pancreatitis, cholelithiasis, cholecystitis, choledocholithiasis, cholangitis, ACS   Patient's presentation is most consistent with acute presentation with potential threat to life or bodily function.   PLAN: Will obtain CBC, CMP, lipase, troponin x 2, urinalysis, chest x-ray and right upper quad abdominal ultrasound.  Will give Mylanta, Protonix.   MEDICATIONS GIVEN IN ED: Medications  aspirin chewable tablet 324 mg (has no administration in time range)  heparin bolus via infusion 4,000 Units (has no administration in time range)  heparin ADULT infusion 100 units/mL (25000 units/286m) (has no administration in time range)  alum & mag hydroxide-simeth (MAALOX/MYLANTA) 200-200-20 MG/5ML suspension 30 mL (30 mLs Oral Given 08/21/22 0521)  pantoprazole (PROTONIX) EC tablet 40 mg (40 mg Oral Given 08/21/22 0521)     ED COURSE: Labs show no leukocytosis, normal creatinine, LFTs other than minimally elevated total bilirubin of 1.6  which appears chronic for him.  Lipase normal.  First troponin negative.  Second pending.  EKG nonischemic.   Second troponin has risen to 54.  Patient now asymptomatic.  Does have multiple risk factors for ACS although symptoms atypical today.  Will give aspirin and start heparin for NSTEMI.  Will discuss with hospitalist for admission.   Patient had  a brief 4 beat run of V. tach.  Normal potassium.  Will add on magnesium level.  He is on cardiac monitoring.   CONSULTS: Consulted and discussed patient's case with hospitalist, Dr. Francine Graven.  I have recommended admission and consulting physician agrees and will place admission orders.  Patient (and family if present) agree with this plan.   I reviewed all nursing notes, vitals, pertinent previous records.  All labs, EKGs, imaging ordered have been independently reviewed and interpreted by myself.    OUTSIDE RECORDS REVIEWED: Reviewed PCP note from 2 days ago.       FINAL CLINICAL IMPRESSION(S) / ED DIAGNOSES   Final diagnoses:  NSTEMI (non-ST elevated myocardial infarction) ([place])     Rx / DC Orders   ED Discharge Orders     None        Note:  This document was prepared using Dragon voice recognition software and may include unintentional dictation errors.   Jkai Arwood, Delice Bison, DO 08/21/22 [place], Delice Bison, DO 08/21/22 [phone]

## 2022-08-21 NOTE — Assessment & Plan Note (Signed)
Patient has a history of hypertension Hold amlodipine and benazepril since he is normotensive Decrease dose of metoprolol to 12.5 mg twice daily

## 2022-08-21 NOTE — ED Triage Notes (Signed)
Pt arrived via [facility] from home where pt reported waking up with the feeling he could not breath, sweating and felt like something was lodged in upper abdomen. On arrival EMS reported pt vomiting. Pt reports since vomiting he felt better but felt he needed to have a BM. Pt to ED straight to bathroom to have BM. Pt repots he feels better since but still feels nauseous.

## 2022-08-21 NOTE — ED Notes (Signed)
Will repeat EKG once extra set of leads available as only basic set of leads currently available.

## 2022-08-21 NOTE — Assessment & Plan Note (Signed)
Place patient on PPI

## 2022-08-21 NOTE — Assessment & Plan Note (Signed)
Patient presents for evaluation of chest pain that woke him up out of sleep associated with shortness of breath, diaphoresis, nausea and emesis. He is currently chest pain-free but has an uptrending troponin level Risk factors include diabetes mellitus and hypertension Will cycle cardiac enzymes Continue aspirin, atorvastatin and beta-blockers Continue heparin drip initiated in the ER Consult cardiology

## 2022-08-21 NOTE — ED Notes (Signed)
Pt unplugged from monitor to move to C-pod room 34; visitor back to bedside; pt up to restroom currently; will transport pt via wheelchair once he is done using restroom.

## 2022-08-21 NOTE — ED Notes (Signed)
Pt up to bedside toilet; steady. Pt's skin dry, resp reg/unlabored, calm.

## 2022-08-21 NOTE — ED Notes (Signed)
Lab states though they "received" blue tube which was in same bag as green tube they haven't "received" green tube yet into system so haven't run trop yet; one lab staff member stated she will check in with a lab tech to see if they can find and run trop now.

## 2022-08-21 NOTE — ED Notes (Signed)
Pt finished with breakfast tray.

## 2022-08-21 NOTE — Consult Note (Signed)
Cardiology Consultation:   Patient ID: Robert Mcguire; [id number]; 1961/06/30   Admit date: 08/21/2022 Date of Consult: 08/21/2022  Primary Care Provider: [name], MD Primary Cardiologist: Angelena Form Primary Electrophysiologist:  None   Patient Profile:   Robert Mcguire is a 62 y.o. male with a hx of nonobstructive CAD by coronary CTA in 06/2022, NICM, DM2, HTN, HLD, obesity, and GERD who is being seen today for the evaluation of elevated high sensitivity troponin at the request of Dr. Francine Graven.  History of Present Illness:   [name] was evaluated in 04/2022 for bradycardia with heart rates in the 50s at night.  He was very active at baseline, working out and Marketing executive.  Echo in 05/2022 showed an EF of 35 to 40%, global hypokinesis with mildly dilated LV internal cavity size, mild LVH, normal RV systolic function, ventricular cavity size, and PASP, trivial mitral regurgitation.  To evaluate his cardiomyopathy, he underwent coronary CTA in 06/2022 which showed less than 25% stenosis in the proximal LAD with a calcium score of 3.42 which was the 29th percentile.  Aggressive risk factor modification and primary prevention was recommended.  He woke up this morning around 1:30 AM with need to have BM.  While ambulating to the restroom, he noticed some shortness of breath with a burning in his chest that was short-lived.  He subsequently nausea and several episodes of emesis.  No symptoms of palpitations, dizziness, presyncope, or syncope.  Due to symptoms, EMS was called and he was transported to the ED where he was found to have stable vitals.  Initial high-sensitivity opponent 12 with a delta and peak troponin of 54, currently downtrending.  Lipase normal.  Chest x-ray showed no cardiopulmonary disease.  Right upper quadrant ultrasound was unrevealing.  EKG showed sinus rhythm, 63 bpm, first-degree AV block, normal coronary progression along the precordial leads, no acute changes when compared  to prior tracing.  He was started on a heparin drip.  Currently patient indicates he feels "great."  He reports he feels back to baseline and feels like he could "go to the gym."    Past Medical History:  Diagnosis Date   Allergic rhinitis    Allergy    Arthritis    Diabetes mellitus without complication ([place])    ED (erectile dysfunction)    GERD (gastroesophageal reflux disease)    Glaucoma    High cholesterol    Hyperlipidemia    Hypertension    Impaired glucose tolerance    Nephrolithiasis    Obesity     Past Surgical History:  Procedure Laterality Date   COLONOSCOPY     difficultity with intabation     GYNECOMASTIA EXCISION Bilateral    POLYPECTOMY     right achilles tendon surgery       Home Meds: Prior to Admission medications   Medication Sig Start Date End Date Taking? Authorizing Provider  alfuzosin (UROXATRAL) 10 MG 24 hr tablet Take 10 mg by mouth daily.   Yes [provider]  amLODipine (NORVASC) 10 MG tablet TAKE 1 TABLET BY MOUTH EVERY DAY 08/06/22  Yes [name], MD  aspirin EC 81 MG tablet Take 81 mg by mouth daily.   Yes [provider]  atorvastatin (LIPITOR) 40 MG tablet Take 1 tablet (40 mg total) by mouth daily. 09/30/21  Yes [name], MD  benazepril (LOTENSIN) 40 MG tablet Take 1 tablet (40 mg total) by mouth daily. 09/30/21  Yes [name],  MD  betamethasone dipropionate 0.05 % lotion Apply 1 Application topically 2 (two) times daily. (Apply thin layer to scalp and face)   Yes [provider]  cholecalciferol (VITAMIN D3) 25 MCG (1000 UNIT) tablet Take 1,000 Units by mouth daily.   Yes [provider]  Elderberry 500 MG CAPS Take 1 capsule by mouth daily.   Yes [provider]  empagliflozin (JARDIANCE) 25 MG TABS tablet Take 1 tablet (25 mg total) by mouth daily. 09/30/21  Yes [name], MD  ketoconazole (NIZORAL) 2 % shampoo Apply 1 Application topically 3 (three) times a week.    Yes [provider]  latanoprost (XALATAN) 0.005 % ophthalmic solution Place 1 drop into both eyes every evening.   Yes [provider]  metoprolol succinate (TOPROL-XL) 100 MG 24 hr tablet TAKE WITH OR IMMEDIATELY FOLLOWING A MEAL. Patient taking differently: Take 100 mg by mouth daily. Take with or immediately following a meal. 07/21/22  Yes [name], MD  Multiple Vitamin (MULTIVITAMIN ADULT PO) Take 1 tablet by mouth daily.   Yes [provider]  timolol (TIMOPTIC) 0.5 % ophthalmic solution Place 1 drop into both eyes daily.   Yes [provider]    Inpatient Medications: Scheduled Meds:  alfuzosin  10 mg Oral Daily   [START ON 08/22/2022] aspirin EC  81 mg Oral Daily   atorvastatin  40 mg Oral Daily   [START ON 08/22/2022] latanoprost  1 drop Both Eyes QHS   metoprolol tartrate  12.5 mg Oral BID   multivitamin with minerals  1 tablet Oral Daily   pantoprazole  40 mg Oral Daily   [START ON 08/22/2022] timolol  1 drop Both Eyes Daily   Continuous Infusions:  heparin 1,200 Units/hr (08/21/22 1115)   PRN Meds: acetaminophen, nitroGLYCERIN, ondansetron (ZOFRAN) IV  Allergies:  No Known Allergies  Social History:   Social History   Socioeconomic History   Marital status: Married    Spouse name: Not on file   Number of children: 2   Years of education: Not on file   Highest education level: Not on file  Occupational History   Occupation: Retired supply Nature conservation officer  Tobacco Use   Smoking status: Never   Smokeless tobacco: Never  Substance and Sexual Activity   Alcohol use: Yes    Alcohol/week: 0.0 standard drinks of alcohol    Comment: occasionally   Drug use: No   Sexual activity: Not on file  Other Topics Concern   Not on file  Social History Narrative   Not on file   Social Determinants of Health   Financial Resource Strain: Not on file  Food Insecurity: Not on file  Transportation Needs: Not on file  Physical  Activity: Not on file  Stress: Not on file  Social Connections: Not on file  Intimate Partner Violence: Not on file     Family History:   Family History  Problem Relation Age of Onset   Hypertension Mother    Heart disease Mother        Pacemaker   Obesity Father    Diabetes Father    Hypertension Father    Colon cancer Father 54   Colon polyps Father    High Cholesterol Father    Cancer Father    Healthy Sister    Hypertension Brother    Colon polyps Brother    Hypertension Brother    Hypertension Maternal Grandmother    Diabetes Other    Hypertension  Other    Breast cancer Other        breast, colon    Stomach cancer Other        paternal great grand father   Esophageal cancer Neg Hx    Rectal cancer Neg Hx     ROS:  Review of Systems  Constitutional:  Positive for diaphoresis and malaise/fatigue. Negative for chills, fever and weight loss.  HENT:  Negative for congestion.   Eyes:  Negative for discharge and redness.  Respiratory:  Positive for shortness of breath. Negative for cough, sputum production and wheezing.   Cardiovascular:  Negative for chest pain, palpitations, orthopnea, claudication, leg swelling and PND.  Gastrointestinal:  Positive for abdominal pain, diarrhea, nausea and vomiting. Negative for blood in stool, constipation, heartburn and melena.  Musculoskeletal:  Negative for falls and myalgias.  Skin:  Negative for rash.  Neurological:  Negative for dizziness, tingling, tremors, sensory change, speech change, focal weakness, loss of consciousness and weakness.  Endo/Heme/Allergies:  Does not bruise/bleed easily.  Psychiatric/Behavioral:  Negative for substance abuse. The patient is not nervous/anxious.   All other systems reviewed and are negative.     Physical Exam/Data:   Vitals:   08/21/22 1004 08/21/22 1025 08/21/22 1030 08/21/22 1100  BP: 124/64  (!) 117/59 121/69  Pulse: 60 63 60 66  Resp:  19 19   Temp:      TempSrc:      SpO2:   99% 99% 98%  Weight:      Height:       No intake or output data in the 24 hours ending 08/21/22 1123 Filed Weights   08/21/22 0258  Weight: 98 kg   Body mass index is 33.83 kg/m.   Physical Exam: General: Well developed, well nourished, in no acute distress. Head: Normocephalic, atraumatic, sclera non-icteric, no xanthomas, nares without discharge.  Neck: Negative for carotid bruits. JVD not elevated. Lungs: Clear bilaterally to auscultation without wheezes, rales, or rhonchi. Breathing is unlabored. Heart: RRR with S1 S2. No murmurs, rubs, or gallops appreciated. Abdomen: Soft, non-tender, non-distended with normoactive bowel sounds. No hepatomegaly. No rebound/guarding. No obvious abdominal masses. Msk:  Strength and tone appear normal for age. Extremities: No clubbing or cyanosis. No edema. Distal pedal pulses are 2+ and equal bilaterally. Neuro: Alert and oriented X 3. No facial asymmetry. No focal deficit. Moves all extremities spontaneously. Psych:  Responds to questions appropriately with a normal affect.   EKG:  The EKG was personally reviewed and demonstrates: sinus rhythm, 63 bpm, first-degree AV block, normal coronary progression along the precordial leads, no acute changes when compared to prior tracing Telemetry:  Telemetry was personally reviewed and demonstrates: sinus bradycardia to sinus rhythm with rates in the upper 50s to 60s bpm  Weights: Filed Weights   08/21/22 0258  Weight: 98 kg    Relevant CV Studies:  2D echo 05/22/2022: 1. Left ventricular ejection fraction, by estimation, is 35 to 40%. The  left ventricle has moderately decreased function. The left ventricle  demonstrates global hypokinesis and regionally worse function in the  inferior wall base and mid, as well as the  anterior and lateral apex. The left ventricular internal cavity size was  mildly dilated. There is mild left ventricular hypertrophy. Left  ventricular diastolic parameters are  indeterminate. Elevated left  ventricular end-diastolic pressure.   2. Right ventricular systolic function is normal. The right ventricular  size is normal. There is normal pulmonary artery systolic pressure. The  estimated right  ventricular systolic pressure is [id number] mmHg.   3. Right atrial size was mildly dilated.   4. The mitral valve is normal in structure. Trivial mitral valve  regurgitation. No evidence of mitral stenosis.   5. The aortic valve is tricuspid. Aortic valve regurgitation is not  visualized. No aortic stenosis is present.   6. The inferior vena cava is normal in size with greater than 50%  respiratory variability, suggesting right atrial pressure of 3 mmHg.   Conclusion(s)/Recommendation(s): No left ventricular mural or apical  thrombus/thrombi.  __________  Coronary CTA 06/12/2022: FINDINGS: Aorta:  Normal size.  No calcifications.  No dissection.   Aortic Valve:  Trileaflet.  No calcifications.   Coronary Arteries:  Normal coronary origin.  Right dominance.   RCA is a dominant artery that gives rise to PDA and PLA. There is no plaque.   Left main gives rise to LAD and LCX arteries.  LM has no disease.   LAD has minimal calcification proximally causing minimal stenosis (<25%).   LCX is a non-dominant artery. There is no plaque.   Other findings:   Normal pulmonary vein drainage into the left atrium.   Normal left atrial appendage without a thrombus.   Normal size of the pulmonary artery.   IMPRESSION: 1. Coronary calcium score of 3.42. This was 29% percentile for age and sex matched control. 2. Normal coronary origin with right dominance. 3. Minimal LAD stenosis (<25%). 4. CAD-RADS 1.  Minimal non-obstructive CAD (0-24%).    Laboratory Data:  Chemistry Recent Labs  Lab 08/21/22 0303  NA 135  K 3.5  CL 104  CO2 22  GLUCOSE 178*  BUN 24*  CREATININE 1.06  CALCIUM 9.5  GFRNONAA >60  ANIONGAP 9    Recent Labs  Lab 08/21/22 0303   PROT 7.1  ALBUMIN 4.1  AST 24  ALT 20  ALKPHOS 50  BILITOT 1.6*   Hematology Recent Labs  Lab 08/21/22 0303  WBC 6.0  RBC 5.05  HGB 15.5  HCT 45.3  MCV 89.7  MCH 30.7  MCHC 34.2  RDW 13.8  PLT 173   Cardiac EnzymesNo results for input(s): "TROPONINI" in the last 168 hours. No results for input(s): "TROPIPOC" in the last 168 hours.  BNPNo results for input(s): "BNP", "PROBNP" in the last 168 hours.  DDimer No results for input(s): "DDIMER" in the last 168 hours.  Radiology/Studies:  US ABDOMEN LIMITED RUQ (LIVER/GB)  Result Date: 08/21/2022 IMPRESSION: Negative right upper quadrant ultrasound Electronically Signed   By: Jorje Guild M.D.   On: 08/21/2022 05:39   DG Chest 2 View  Result Date: 08/21/2022 IMPRESSION: No active cardiopulmonary disease. Electronically Signed   By: Jacqulynn Cadet M.D.   On: 08/21/2022 05:32    Assessment and Plan:   1. Nonobstructive CAD with elevated high sensitivity troponin: -Mildly elevated high sensitivity troponin not consistent with ACS -Consistent with supply demand ischemia in the setting of GI illness -Recent coronary CTA in 06/2022 showed nonobstructive CAD with < 25% stenosis of the LAD -Patient feels "great" currently, and is back to baseline -Troponin peaked at 54 and is now down trending -Will discuss stopping heparin gtt with MD -Do not anticipate further inpatient ischemic testing -ASA, Lipitor  2. NICM: -Euvolemic and well compensated -EF 35-40% in 05/2022 -Continue PTA ACEi and beta blocker   3. Nausea, vomiting, and diarrhea: -Resolved, indicates he "feels like going to the gym"    For questions or updates, please contact [facility] Please consult  [email or web address] for contact info under Cardiology/STEMI.   Signed, Christell Faith, PA-C [place] Pager: [phone] 08/21/2022, 11:23 AM

## 2022-08-21 NOTE — ED Notes (Signed)
Pt states completed swallow screen earlier this morning and previous nurse stated in report that pt passed it however this RN currently not seeing [facility] swallow screen in chart so will repeat and chart soon.

## 2022-08-21 NOTE — Progress Notes (Signed)
ANTICOAGULATION CONSULT NOTE - Initial Consult  Pharmacy Consult for Heparin  Indication: chest pain/ACS  No Known Allergies  Patient Measurements: Height: 5' 7"$  (170.2 cm) Weight: 98 kg (216 lb) IBW/kg (Calculated) : 66.1 Heparin Dosing Weight: 87.2 kg   Vital Signs: Temp: 98 F (36.7 C) (02/15 0613) Temp Source: Oral (02/15 0613) BP: 125/60 (02/15 0613) Pulse Rate: 56 (02/15 0613)  Labs: Recent Labs    08/21/22 0303 08/21/22 0526  HGB 15.5  --   HCT 45.3  --   PLT 173  --   CREATININE 1.06  --   TROPONINIHS 12 54*    Estimated Creatinine Clearance: 80.6 mL/min (by C-G formula based on SCr of 1.06 mg/dL).   Medical History: Past Medical History:  Diagnosis Date   Allergic rhinitis    Allergy    Arthritis    Diabetes mellitus without complication ([facility])    ED (erectile dysfunction)    GERD (gastroesophageal reflux disease)    Glaucoma    High cholesterol    Hyperlipidemia    Hypertension    Impaired glucose tolerance    Nephrolithiasis    Obesity     Medications:  (Not in a hospital admission)   Assessment: Pharmacy consulted to dose heparin in this 62 year old male admitted with ACS/NSTEMI.  No prior anticoag noted.  CrCl = 80.6 ml/min   Goal of Therapy:  Heparin level 0.3-0.7 units/ml Monitor platelets by anticoagulation protocol: Yes   Plan:  Give 4000 units bolus x 1 Start heparin infusion at 1200 units/hr Check anti-Xa level in 6 hours and daily while on heparin  Reno Clasby D 08/21/2022,6:55 AM

## 2022-08-21 NOTE — ED Notes (Signed)
Called lab as blue tube INR results posted but no results yet for trop sent.

## 2022-08-21 NOTE — Assessment & Plan Note (Signed)
BMI [id number] Complicates overall prognosis and care Lifestyle modification and exercise has been discussed with patient in detail

## 2022-08-21 NOTE — ED Notes (Signed)
Pt A&Ox4; resp reg/unlabored; denies nausea currently. Visitor at bedside.

## 2022-08-21 NOTE — ED Notes (Signed)
Repeat EKG to EDP Siadecki in person.

## 2022-08-21 NOTE — ED Notes (Signed)
Will hang heparin gtt once IV pump available as received IV channel but another staff member took pump.

## 2022-08-21 NOTE — ED Notes (Signed)
Heparin gtt to be hung as soon as channel available; none currently available in ER; secretary having portable send some soon.

## 2022-08-22 ENCOUNTER — Encounter: Payer: Self-pay | Admitting: Family Medicine

## 2022-08-22 DIAGNOSIS — R7989 Other specified abnormal findings of blood chemistry: Secondary | ICD-10-CM | POA: Diagnosis not present

## 2022-08-22 DIAGNOSIS — I428 Other cardiomyopathies: Secondary | ICD-10-CM | POA: Diagnosis not present

## 2022-08-22 DIAGNOSIS — R112 Nausea with vomiting, unspecified: Secondary | ICD-10-CM | POA: Diagnosis not present

## 2022-08-22 DIAGNOSIS — R197 Diarrhea, unspecified: Secondary | ICD-10-CM | POA: Diagnosis not present

## 2022-08-22 LAB — CBC
HCT: 44.2 % (ref 39.0–52.0)
Hemoglobin: 15.2 g/dL (ref 13.0–17.0)
MCH: 30.9 pg (ref 26.0–34.0)
MCHC: 34.4 g/dL (ref 30.0–36.0)
MCV: 89.8 fL (ref 80.0–100.0)
Platelets: 154 10*3/uL (ref 150–400)
RBC: 4.92 MIL/uL (ref 4.22–5.81)
RDW: 14.1 % (ref 11.5–15.5)
WBC: 5 10*3/uL (ref 4.0–10.5)
nRBC: 0 % (ref 0.0–0.2)

## 2022-08-22 LAB — CBG MONITORING, ED: Glucose-Capillary: 86 mg/dL (ref 70–99)

## 2022-08-22 MED ORDER — SACUBITRIL-VALSARTAN 49-51 MG PO TABS: 1.0000 | ORAL_TABLET | Freq: Two times a day (BID) | ORAL | 2 refills | Status: DC

## 2022-08-22 MED ORDER — METOPROLOL SUCCINATE ER 25 MG PO TB24: 25.0000 mg | ORAL_TABLET | Freq: Every day | ORAL | 2 refills | Status: DC

## 2022-08-22 MED ORDER — SACUBITRIL-VALSARTAN 49-51 MG PO TABS: 1.0000 | ORAL_TABLET | Freq: Two times a day (BID) | ORAL | Status: DC

## 2022-08-22 MED ORDER — METOPROLOL SUCCINATE ER 50 MG PO TB24
25.0000 mg | ORAL_TABLET | Freq: Every day | ORAL | Status: DC
  Administered 2022-08-22: 25 mg via ORAL
  Filled 2022-08-22: qty 1

## 2022-08-22 NOTE — Discharge Summary (Signed)
Physician Discharge Summary   KYAL SABHARWAL  male DOB: 1961-05-05  [id number]  PCP: [name], MD  Admit date: 08/21/2022 Discharge date: 08/22/2022  Admitted From: home Disposition:  home Wife updated at bedside prior to discharge. CODE STATUS: Full code  Discharge Instructions     Discharge instructions   Complete by: As directed    Cardiologist has made some changes to your medication.  Please stop amlodipine and benazepril.  Your Toporol is decreased from 100 mg to 25 mg daily.  Please start taking Entresto on 08/25/22.   Dr. Enzo Bi [facility] Course:  For full details, please see H&P, progress notes, consult notes and ancillary notes.  Briefly,  MASAYOSHI KARNITZ is a 62 y.o. male with medical history significant for type 2 diabetes mellitus on oral agents, GERD, hypertension, dyslipidemia, obesity who presents to the emergency room for evaluation of chest pain.   He had several episodes of emesis with improvement in his symptoms. He attributed his symptoms to indigestion. He was brought into the ER by EMS for further evaluation.  BP 92/57 on presentation.  Due to trop going from 12 to 54, pt was started on heparin gtt and cardiology consulted.  Elevated trop, mild * ACS, ruled out --cardiology consulted, deemed elevated trop 2/2 Supply/demand mismatch in the setting of hypotension, GI distress.   --Prior cardiac CTA with nonischemic disease, no further ischemic workup needed   Nonischemic cardiomyopathy Chronic systolic CHF Etiology unclear, EF 35 to 40% on echo November 2023 Completed ischemic workup with cardiac CTA showing nonobstructive coronary disease --cardiology consulted, with rec below. --d/c home benazepril.  Start Entresto 49/51 mg twice daily on 08/25/22. -d/c amlodipine in the setting of cardiomyopathy -Continue metoprolol succinate at lower dose 25 mg given bradycardia noted -Continue home Jardiance -Referral to advanced heart failure  clinic, could consider cardiac MRI given nonischemic cardiomyopathy and young gentleman with no precipitating features   Hx of Diabetes mellitus, not currently active --A1c has been normal for the past 4 years   Hx of GERD --currently not on PPI, symptoms controlled   Essential hypertension --see above for medication changes   Obesity, BMI 33.8   Discharge Diagnoses:  Principal Problem:   ACS (acute coronary syndrome) ([place]) Active Problems:   Obesity   Essential hypertension   GERD   Diabetes mellitus ([place])   Elevated troponin   NICM (nonischemic cardiomyopathy) (H[facility])   Gastroenteritis   Nausea and vomiting   Diarrhea   Vasovagal episode     Discharge Instructions:  Allergies as of 08/22/2022   No Known Allergies      Medication List     STOP taking these medications    amLODipine 10 MG tablet Commonly known as: NORVASC   benazepril 40 MG tablet Commonly known as: LOTENSIN       TAKE these medications    alfuzosin 10 MG 24 hr tablet Commonly known as: UROXATRAL Take 10 mg by mouth daily.   aspirin EC 81 MG tablet Take 81 mg by mouth daily.   atorvastatin 40 MG tablet Commonly known as: LIPITOR Take 1 tablet (40 mg total) by mouth daily.   betamethasone dipropionate 0.05 % lotion Apply 1 Application topically 2 (two) times daily. (Apply thin layer to scalp and face)   cholecalciferol 25 MCG (1000 UNIT) tablet Commonly known as: VITAMIN D3 Take 1,000 Units by mouth daily.   Elderberry 500 MG Caps Take 1 capsule by  mouth daily.   empagliflozin 25 MG Tabs tablet Commonly known as: Jardiance Take 1 tablet (25 mg total) by mouth daily.   ketoconazole 2 % shampoo Commonly known as: NIZORAL Apply 1 Application topically 3 (three) times a week.   latanoprost 0.005 % ophthalmic solution Commonly known as: XALATAN Place 1 drop into both eyes every evening.   metoprolol succinate 25 MG 24 hr tablet Commonly known as: TOPROL-XL Take 1 tablet  (25 mg total) by mouth daily. Take with or immediately following a meal. What changed:  medication strength See the new instructions.   MULTIVITAMIN ADULT PO Take 1 tablet by mouth daily.   sacubitril-valsartan 49-51 MG Commonly known as: ENTRESTO Take 1 tablet by mouth 2 (two) times daily. Start taking on: August 25, 2022   timolol 0.5 % ophthalmic solution Commonly known as: TIMOPTIC Place 1 drop into both eyes daily.         Follow-up Information     Minna Merritts, MD Follow up in 2 week(s).   Specialty: Cardiology Contact information: [place] [address] [phone]         [name], MD Follow up in 1 week(s).   Specialty: Family Medicine Contact information: [place] [address] [phone]                 No Known Allergies   The results of significant diagnostics from this hospitalization (including imaging, microbiology, ancillary and laboratory) are listed below for reference.   Consultations:   Procedures/Studies: US ABDOMEN LIMITED RUQ (LIVER/GB)  Result Date: 08/21/2022 CLINICAL DATA:  Upper abdominal pain since 2 a.m. EXAM: ULTRASOUND ABDOMEN LIMITED RIGHT UPPER QUADRANT COMPARISON:  Abdominal CT 10/27/2013 FINDINGS: Gallbladder: Minimal distension of the gallbladder. No detected stone and no focal tenderness Common bile duct: Diameter: 3 mm Liver: No focal lesion identified. Within normal limits in parenchymal echogenicity. Portal vein is patent on color Doppler imaging with normal direction of blood flow towards the liver. Other: 3.5 cm right renal cyst known from a 2015 abdominal CT. There is partial visualization due to shadowing rib. No imaging follow-up recommended. IMPRESSION: Negative right upper quadrant ultrasound Electronically Signed   By: Jorje Guild M.D.   On: 08/21/2022 05:39   DG Chest 2 View  Result Date: 08/21/2022 CLINICAL DATA:  Short of breath  EXAM: CHEST - 2 VIEW COMPARISON:  Prior chest x-ray 05/04/2019 FINDINGS: Stable chronic bronchitic changes. Cardiac and mediastinal contours are within normal limits. No pleural effusion or pneumothorax. No acute osseous abnormality. IMPRESSION: No active cardiopulmonary disease. Electronically Signed   By: Jacqulynn Cadet M.D.   On: 08/21/2022 05:32      Labs: BNP (last 3 results) No results for input(s): "BNP" in the last 8760 hours. Basic Metabolic Panel: Recent Labs  Lab 08/21/22 0303 08/21/22 0810  NA 135  --   K 3.5  --   CL 104  --   CO2 22  --   GLUCOSE 178*  --   BUN 24*  --   CREATININE 1.06  --   CALCIUM 9.5  --   MG  --  2.4   Liver Function Tests: Recent Labs  Lab 08/21/22 0303  AST 24  ALT 20  ALKPHOS 50  BILITOT 1.6*  PROT 7.1  ALBUMIN 4.1   Recent Labs  Lab 08/21/22 0303  LIPASE 36   No results for input(s): "AMMONIA" in the last 168 hours. CBC: Recent Labs  Lab 08/21/22 0303 08/22/22 0436  WBC 6.0 5.0  HGB 15.5 15.2  HCT 45.3 44.2  MCV 89.7 89.8  PLT 173 154   Cardiac Enzymes: No results for input(s): "CKTOTAL", "CKMB", "CKMBINDEX", "TROPONINI" in the last 168 hours. BNP: Invalid input(s): "POCBNP" CBG: Recent Labs  Lab 08/21/22 1055 08/21/22 1949 08/22/22 0732  GLUCAP 173* 116* 86   D-Dimer No results for input(s): "DDIMER" in the last 72 hours. Hgb A1c No results for input(s): "HGBA1C" in the last 72 hours. Lipid Profile No results for input(s): "CHOL", "HDL", "LDLCALC", "TRIG", "CHOLHDL", "LDLDIRECT" in the last 72 hours. Thyroid function studies No results for input(s): "TSH", "T4TOTAL", "T3FREE", "THYROIDAB" in the last 72 hours.  Invalid input(s): "FREET3" Anemia work up No results for input(s): "VITAMINB12", "FOLATE", "FERRITIN", "TIBC", "IRON", "RETICCTPCT" in the last 72 hours. Urinalysis    Component Value Date/Time   COLORURINE YELLOW (A) 08/21/2022 0303   APPEARANCEUR CLOUDY (A) 08/21/2022 0303   LABSPEC  1.020 08/21/2022 0303   PHURINE 5.0 08/21/2022 0303   GLUCOSEU >=500 (A) 08/21/2022 0303   HGBUR SMALL (A) 08/21/2022 0303   HGBUR negative 07/26/2010 0850   BILIRUBINUR NEGATIVE 08/21/2022 0303   BILIRUBINUR neg 05/13/2016 0930   KETONESUR NEGATIVE 08/21/2022 0303   PROTEINUR >=300 (A) 08/21/2022 0303   UROBILINOGEN 0.2 05/13/2016 0930   UROBILINOGEN 0.2 07/26/2010 0850   NITRITE NEGATIVE 08/21/2022 0303   LEUKOCYTESUR NEGATIVE 08/21/2022 0303   Sepsis Labs Recent Labs  Lab 08/21/22 0303 08/22/22 0436  WBC 6.0 5.0   Microbiology No results found for this or any previous visit (from the past 240 hour(s)).   Total time spend on discharging this patient, including the last patient exam, discussing the hospital stay, instructions for ongoing care as it relates to all pertinent caregivers, as well as preparing the medical discharge records, prescriptions, and/or referrals as applicable, is 45 minutes.    Enzo Bi, MD  [facility] 08/22/2022, 9:10 AM

## 2022-08-22 NOTE — Progress Notes (Addendum)
Progress Note  Patient Name: Robert Mcguire Date of Encounter: 08/22/2022  Primary Cardiologist: Angelena Form  Subjective   No chest pain, dyspnea, palpitations, dizziness, presyncope, or syncope. No further nausea or vomiting.   Inpatient Medications    Scheduled Meds:  alfuzosin  10 mg Oral Daily   aspirin EC  81 mg Oral Daily   atorvastatin  40 mg Oral Daily   latanoprost  1 drop Both Eyes QHS   metoprolol succinate  25 mg Oral Daily   multivitamin with minerals  1 tablet Oral Daily   pantoprazole  40 mg Oral Daily   timolol  1 drop Both Eyes Daily   Continuous Infusions:  PRN Meds: acetaminophen, nitroGLYCERIN, ondansetron (ZOFRAN) IV   Vital Signs    Vitals:   08/22/22 0645 08/22/22 0700 08/22/22 0730 08/22/22 0800  BP:  129/74 134/71 137/73  Pulse: 65  61 61  Resp:  13 17 19  $ Temp:      TempSrc:      SpO2:  97% 97% 99%  Weight:      Height:       No intake or output data in the 24 hours ending 08/22/22 0845 Filed Weights   08/21/22 0258  Weight: 98 kg    Telemetry    SR with sinus bradycardia in the 50s to 70s bpm - Personally Reviewed  ECG    No new tracings - Personally Reviewed  Physical Exam   GEN: No acute distress.   Neck: No JVD. Cardiac: RRR, no murmurs, rubs, or gallops.  Respiratory: Clear to auscultation bilaterally.  GI: Soft, nontender, non-distended.   MS: No edema; No deformity. Neuro:  Alert and oriented x 3; Nonfocal.  Psych: Normal affect.  Labs    Chemistry Recent Labs  Lab 08/21/22 0303  NA 135  K 3.5  CL 104  CO2 22  GLUCOSE 178*  BUN 24*  CREATININE 1.06  CALCIUM 9.5  PROT 7.1  ALBUMIN 4.1  AST 24  ALT 20  ALKPHOS 50  BILITOT 1.6*  GFRNONAA >60  ANIONGAP 9     Hematology Recent Labs  Lab 08/21/22 0303 08/22/22 0436  WBC 6.0 5.0  RBC 5.05 4.92  HGB 15.5 15.2  HCT 45.3 44.2  MCV 89.7 89.8  MCH 30.7 30.9  MCHC 34.2 34.4  RDW 13.8 14.1  PLT 173 154    Cardiac EnzymesNo results for input(s):  "TROPONINI" in the last 168 hours. No results for input(s): "TROPIPOC" in the last 168 hours.   BNPNo results for input(s): "BNP", "PROBNP" in the last 168 hours.   DDimer No results for input(s): "DDIMER" in the last 168 hours.   Radiology    US ABDOMEN LIMITED RUQ (LIVER/GB)  Result Date: 08/21/2022 IMPRESSION: Negative right upper quadrant ultrasound Electronically Signed   By: Jorje Guild M.D.   On: 08/21/2022 05:39   DG Chest 2 View  Result Date: 08/21/2022 IMPRESSION: No active cardiopulmonary disease. Electronically Signed   By: Jacqulynn Cadet M.D.   On: 08/21/2022 05:32    Cardiac Studies   2D echo 05/22/2022: 1. Left ventricular ejection fraction, by estimation, is 35 to 40%. The  left ventricle has moderately decreased function. The left ventricle  demonstrates global hypokinesis and regionally worse function in the  inferior wall base and mid, as well as the  anterior and lateral apex. The left ventricular internal cavity size was  mildly dilated. There is mild left ventricular hypertrophy. Left  ventricular diastolic parameters are  indeterminate. Elevated left  ventricular end-diastolic pressure.   2. Right ventricular systolic function is normal. The right ventricular  size is normal. There is normal pulmonary artery systolic pressure. The  estimated right ventricular systolic pressure is [id number] mmHg.   3. Right atrial size was mildly dilated.   4. The mitral valve is normal in structure. Trivial mitral valve  regurgitation. No evidence of mitral stenosis.   5. The aortic valve is tricuspid. Aortic valve regurgitation is not  visualized. No aortic stenosis is present.   6. The inferior vena cava is normal in size with greater than 50%  respiratory variability, suggesting right atrial pressure of 3 mmHg.   Conclusion(s)/Recommendation(s): No left ventricular mural or apical  thrombus/thrombi.  __________   Coronary CTA 06/12/2022: FINDINGS: Aorta:  Normal  size.  No calcifications.  No dissection.   Aortic Valve:  Trileaflet.  No calcifications.   Coronary Arteries:  Normal coronary origin.  Right dominance.   RCA is a dominant artery that gives rise to PDA and PLA. There is no plaque.   Left main gives rise to LAD and LCX arteries.  LM has no disease.   LAD has minimal calcification proximally causing minimal stenosis (<25%).   LCX is a non-dominant artery. There is no plaque.   Other findings:   Normal pulmonary vein drainage into the left atrium.   Normal left atrial appendage without a thrombus.   Normal size of the pulmonary artery.   IMPRESSION: 1. Coronary calcium score of 3.42. This was 29% percentile for age and sex matched control. 2. Normal coronary origin with right dominance. 3. Minimal LAD stenosis (<25%). 4. CAD-RADS 1.  Minimal non-obstructive CAD (0-24%).  Patient Profile     62 y.o. male with history of nonobstructive CAD by coronary CTA in 06/2022, NICM, DM2, HTN, HLD, obesity, and GERD who is being seen today for the evaluation of elevated high sensitivity troponin at the request of Dr. Francine Graven.   Assessment & Plan    1. Nonobstructive CAD with elevated high sensitivity troponin: -Mildly elevated high sensitivity troponin not consistent with ACS -Consistent with supply demand ischemia in the setting of GI illness -Recent coronary CTA in 06/2022 showed nonobstructive CAD with < 25% stenosis of the LAD -No symptoms of angina or cardiac decompensation  -Back to baseline -Troponin peaked at 54 and is now down trending -No plans for inpatient ischemic testing -ASA, Lipitor   2. NICM: -Euvolemic and well compensated -EF 35-40% in 05/2022 -Stop amlodipine to allow for BP room to escalate GDMT -Hold ACEi, do not continue ACEi at discharge -Start Entresto 49/51 mg bid on 08/25/2022 (to account for ACEi washout) -Decrease Toprol XL to 25 mg daily given bradycardia -Continue Jardiance -In the outpatient  setting, look to add MRA -Would pursue follow up limited echo in several months on maximally tolerated GDMT to re-evaluate LVSF -Consider referral to the advanced heart failure clinic  -As an outpatient, consider cMRI   3. Nausea, vomiting, and diarrhea: -Possibly vasovagal response with underlying bradycardia from beta blocker -Resolved -Decrease beta blocker as above   Ok for discharge from a cardiac perspective with follow up in our office in 1-2 weeks      For questions or updates, please contact [place] Please consult [email or web address] for contact info under Cardiology/STEMI.    Signed, Christell Faith, PA-C [facility] Pager: [phone] 08/22/2022, 8:45 AM

## 2022-08-22 NOTE — ED Notes (Signed)
RN waiting for other medications to be delivered from pharmacy.

## 2022-08-24 LAB — LIPOPROTEIN A (LPA): Lipoprotein (a): 55.7 nmol/L — ABNORMAL HIGH (ref <75.0)

## 2022-08-25 ENCOUNTER — Telehealth: Payer: Self-pay

## 2022-08-25 NOTE — Transitions of Care (Post Inpatient/ED Visit) (Signed)
   08/25/2022  Name: Robert Mcguire MRN: [id number] DOB: Nov 16, 1960  Today's TOC FU Call Status:    Transition Care Management Follow-up Telephone Call Date of Discharge: 08/22/22 Discharge Facility: [facility]) Type of Discharge: Inpatient Admission Primary Inpatient Discharge Diagnosis:: NSTEMI How have you been since you were released from the hospital?: Better Any questions or concerns?: No  Items Reviewed: Did you receive and understand the discharge instructions provided?: No Medications obtained and verified?: Yes (Medications Reviewed) Any new allergies since your discharge?: No Dietary orders reviewed?: Yes Do you have support at home?: Yes  Home Care and Equipment/Supplies: [place] Ordered?: NA Any new equipment or medical supplies ordered?: NA  Functional Questionnaire: Do you need assistance with bathing/showering or dressing?: No Do you need assistance with meal preparation?: No Do you need assistance with eating?: No Do you have difficulty maintaining continence: No Do you need assistance with getting out of bed/getting out of a chair/moving?: No Do you have difficulty managing or taking your medications?: No  Folllow up appointments reviewed: PCP Follow-up appointment confirmed?: No MD Provider Line Number:[phone] Given: Yes [facility] Follow-up appointment confirmed?: No Reason Specialist Follow-Up Not Confirmed: Patient has Specialist Provider Number and will Call for Appointment Do you need transportation to your follow-up appointment?: No Do you understand care options if your condition(s) worsen?: Yes-patient verbalized understanding    [place] LPN [place] Direct Dial [phone]

## 2022-08-26 ENCOUNTER — Telehealth: Payer: Self-pay | Admitting: Cardiovascular Disease

## 2022-08-26 ENCOUNTER — Ambulatory Visit: Payer: Self-pay

## 2022-08-26 DIAGNOSIS — I428 Other cardiomyopathies: Secondary | ICD-10-CM

## 2022-08-26 NOTE — Telephone Encounter (Signed)
Patient is requesting to be worked in for an urgent appt with Dr. Rockey Situ. He states Dr. Rockey Situ advised him to call and request this while admitted.   Advised patient of provider switch being needed before an appt can be scheduled due to him being established with Dr. Angelena Form. Patient was upset by this, but verbalized understanding.   Approval received by Dr. Angelena Form. Please advise.

## 2022-08-26 NOTE — Telephone Encounter (Signed)
Patient is calling requesting a provider switch from Dr. Angelena Form to Dr. Rockey Situ due to wanting to be seen in [facility] and seeing Dr. Rockey Situ in the hospital. Please advise.

## 2022-08-26 NOTE — Telephone Encounter (Addendum)
  Chief Complaint: Last night sob and low bp Symptoms: above - now feels fine Frequency: last night and 08/21/2021 Pertinent Negatives: Patient denies Current s/s Disposition: [x]$ ED /[]$ Urgent Care (no appt availability in office) / []$ Appointment(In office/virtual)/ []$  [facility]/ []$ Home Care/ []$ Refused Recommended Disposition /[]$ [facility]/ []$  Follow-up with PCP Additional Notes:   Last night pt had similar s/s to what made him go to the ED on the 15th. Short of breath, low BP. PT feels better and did not go to ED last night. Pt states that he feels fine now. PT is afraid that what happened last night will occur again tonight. Given recent ED visit, reccommended pt return to ED for evaluation. Pt states he will call Dr. Volanda Napoleon and cardiologist's offices for further advice.   Answer Assessment - Initial Assessment Questions 1. REASON FOR CALL or QUESTION: "What is your reason for calling today?" or "How can I best help you?" or "What question do you have that I can help answer?"     Last night pt had similar s/s to what made him go to the ED on the 15th. Short of breath, low BP. PT felt better and did not go to ED last night.  Protocols used: Information Only Call - No Triage-A-AH

## 2022-08-27 ENCOUNTER — Encounter: Payer: Self-pay | Admitting: Family Medicine

## 2022-08-27 ENCOUNTER — Ambulatory Visit (INDEPENDENT_AMBULATORY_CARE_PROVIDER_SITE_OTHER): Payer: 59 | Admitting: Family Medicine

## 2022-08-27 VITALS — BP 130/68 | HR 56 | Temp 98.7°F | Ht 67.0 in | Wt 220.0 lb

## 2022-08-27 DIAGNOSIS — I428 Other cardiomyopathies: Secondary | ICD-10-CM

## 2022-08-27 DIAGNOSIS — I252 Old myocardial infarction: Secondary | ICD-10-CM

## 2022-08-27 DIAGNOSIS — K219 Gastro-esophageal reflux disease without esophagitis: Secondary | ICD-10-CM

## 2022-08-27 DIAGNOSIS — E119 Type 2 diabetes mellitus without complications: Secondary | ICD-10-CM | POA: Diagnosis not present

## 2022-08-27 DIAGNOSIS — I10 Essential (primary) hypertension: Secondary | ICD-10-CM

## 2022-08-27 DIAGNOSIS — Z09 Encounter for follow-up examination after completed treatment for conditions other than malignant neoplasm: Secondary | ICD-10-CM

## 2022-08-27 DIAGNOSIS — R7989 Other specified abnormal findings of blood chemistry: Secondary | ICD-10-CM | POA: Diagnosis not present

## 2022-08-27 MED ORDER — OMEPRAZOLE 20 MG PO CPDR: 20.0000 mg | DELAYED_RELEASE_CAPSULE | Freq: Every day | ORAL | 3 refills | Status: DC

## 2022-08-27 MED ORDER — PANTOPRAZOLE SODIUM 20 MG PO TBEC: 20.0000 mg | DELAYED_RELEASE_TABLET | Freq: Every day | ORAL | 3 refills | Status: DC

## 2022-08-27 NOTE — Progress Notes (Signed)
   Established Patient Office Visit   Subjective  Patient ID: Robert Mcguire, male    DOB: September 08, 1960  Age: 62 y.o. MRN: [id number]  Chief Complaint  Patient presents with  . Hospitalization Follow-up  Pt accompanied by his wife, [name].  Pt is 62 yo male with DM II, ED, CHF, HTN for HFU.  Pt seen in ED on 2/15 for NSTEMI. Pt initially though symptoms were due to indigestion.  Presented to ED with SOB and emesis. Initial trpn negative, second troponin 54.  Given ASA and heparin for NSTEMI.  Cards consulted.  CXR normal.  RUQ [place] negative.  Norvasc and benazepril d/c.  Toprol decreased from 100 mg to 25 mg, and Entresto started.  {History (Optional):23778}  ROS Negative unless stated above    Objective:     BP 130/68   Pulse (!) 56   Temp 98.7 F (37.1 C) (Oral)   Ht 5\' 7"  (1.702 m)   Wt 220 lb (99.8 kg)   SpO2 99%   BMI 34.46 kg/m  {Vitals History (Optional):23777}  Physical Exam Constitutional:      General: He is not in acute distress.    Appearance: Normal appearance.  HENT:     Head: Normocephalic and atraumatic.     Nose: Nose normal.     Mouth/Throat:     Mouth: Mucous membranes are moist.  Cardiovascular:     Rate and Rhythm: Normal rate and regular rhythm.     Heart sounds: Normal heart sounds. No murmur heard.    No gallop.  Pulmonary:     Effort: Pulmonary effort is normal. No respiratory distress.     Breath sounds: Normal breath sounds. No wheezing, rhonchi or rales.  Skin:    General: Skin is warm and dry.  Neurological:     Mental Status: He is alert and oriented to person, place, and time.     No results found for any visits on 08/27/22.    Assessment & Plan:  Status post non-ST elevation myocardial infarction (NSTEMI)  Gastroesophageal reflux disease, unspecified whether esophagitis present -     Ambulatory referral to Gastroenterology -     Pantoprazole Sodium; Take 1 tablet (20 mg total) by mouth daily.  Dispense: 30 tablet; Refill: 3  NICM  (nonischemic cardiomyopathy) ([place])  Essential hypertension  Type 2 diabetes mellitus without complication, without long-term current use of insulin ([name])   Per further review of chart from hospitalization.  Question of NSTEMI dx as elevated trpn felt due to demand from hypotension No follow-ups on file.   [name], MD

## 2022-08-27 NOTE — Patient Instructions (Addendum)
Do not forget to set up an appointment with cardiology.  A prescription for Prevacid was sent to your pharmacy.  You can start taking this daily to see if you notice improvement in symptoms.  A referral to gastroenterology was placed.  You should expect a phone call about scheduling this appointment.

## 2022-08-27 NOTE — Telephone Encounter (Signed)
Sent to scheduling pool to assist with making appointments.    Okay to switch to [place] he is leaving the hospital, okay to use a DOD slot Could also be seen one of the CHF clinic providers in [place] given cardiomyopathy at young age Bobbye Riggs

## 2022-08-27 NOTE — Telephone Encounter (Signed)
Referral has been placed. Thanks for your assistance.

## 2022-08-28 ENCOUNTER — Encounter: Payer: Self-pay | Admitting: Gastroenterology

## 2022-09-02 ENCOUNTER — Ambulatory Visit: Payer: 59 | Admitting: Cardiovascular Disease

## 2022-09-02 NOTE — Progress Notes (Unsigned)
Chief Complaint:   OBESITY Auriel is here to discuss his progress with his obesity treatment plan along with follow-up of his obesity related diagnoses. Erie is on keeping a food journal and adhering to recommended goals of 1500-1800 calories and 100+ grams of protein and states he is following his eating plan approximately 60% of the time. Shaw states he is doing strength, walking, and rowing for 45 minutes 4 times per week.  Today's visit was #: 8 Starting weight: 232 lbs Starting date: 01/28/2022 Today's weight: 218 lbs Today's date: 08/19/2022 Total lbs lost to date: 14 Total lbs lost since last in-office visit: 0  Interim History: Elridge was changed to journaling but he struggled with this and he feels he does better with a structured eating plan. He is open to looking at options.   Subjective:   1. Essential hypertension Suvan's blood pressure is elevated today, which is not normal for him. He is on Lotensin, Norvasc, and Toprol and he is taking them regularly. He denies chest pain.   2. Type 2 diabetes mellitus with other specified complication, without long-term current use of insulin (HCC) Jakeryan is on [place] and he is working on his diet. He is open to looking at other eating plans that might help control his glucose better as well as help with weight loss.   Assessment/Plan:   1. Essential hypertension Macklyn was educated on variances of blood pressure. He is to continue his medications as is and we will recheck his blood pressure in 4 weeks.   2. Type 2 diabetes mellitus with other specified complication, without long-term current use of insulin (HCC) Con will change to the low carbohydrate plan for 2-4 weeks, and we will monitor his progress.   3. BMI 34.0-34.9,adult  4. Obesity, Beginning BMI 36.34 Gerrit is currently in the action stage of change. As such, his goal is to continue with weight loss efforts. He has agreed to the Category 3 Plan or following a lower carbohydrate,  vegetable and lean protein rich diet plan.   We discussed multiple options today.   Exercise goals: As is.   Behavioral modification strategies: increasing lean protein intake, increasing water intake, and decreasing sodium intake.  Obediah has agreed to follow-up with our clinic in 4 weeks. He was informed of the importance of frequent follow-up visits to maximize his success with intensive lifestyle modifications for his multiple health conditions.   Objective:   Blood pressure (!) 145/78, pulse (!) 57, temperature 98.1 F (36.7 C), height '5\' 7"'$  (1.702 m), weight 218 lb (98.9 kg), SpO2 99 %. Body mass index is 34.14 kg/m.  Lab Results  Component Value Date   CREATININE 1.06 08/21/2022   BUN 24 (H) 08/21/2022   NA 135 08/21/2022   K 3.5 08/21/2022   CL 104 08/21/2022   CO2 22 08/21/2022   Lab Results  Component Value Date   ALT 20 08/21/2022   AST 24 08/21/2022   ALKPHOS 50 08/21/2022   BILITOT 1.6 (H) 08/21/2022   Lab Results  Component Value Date   HGBA1C 5.3 01/29/2022   HGBA1C 5.6 09/30/2021   HGBA1C 5.5 03/15/2021   HGBA1C 5.4 09/20/2020   HGBA1C 5.4 06/08/2020   Lab Results  Component Value Date   INSULIN 10.3 01/29/2022   Lab Results  Component Value Date   TSH 1.810 01/29/2022   Lab Results  Component Value Date   CHOL 127 01/29/2022   HDL 44 01/29/2022   LDLCALC 70  01/29/2022   LDLDIRECT 168.0 05/13/2016   TRIG 63 01/29/2022   CHOLHDL 3 09/30/2021   Lab Results  Component Value Date   VD25OH 35.6 01/29/2022   VD25OH 33.70 09/20/2020   Lab Results  Component Value Date   WBC 5.0 08/22/2022   HGB 15.2 08/22/2022   HCT 44.2 08/22/2022   MCV 89.8 08/22/2022   PLT 154 08/22/2022   No results found for: "IRON", "TIBC", "FERRITIN"  Attestation Statements:   Reviewed by clinician on day of visit: allergies, medications, problem list, medical history, surgical history, family history, social history, and previous encounter notes.  Time spent  on visit including pre-visit chart review and post-visit care and charting was 30 minutes.   I, Trixie Dredge, am acting as transcriptionist for Dennard Nip, MD.  I have reviewed the above documentation for accuracy and completeness, and I agree with the above. -  Dennard Nip, MD

## 2022-09-03 ENCOUNTER — Encounter: Payer: Self-pay | Admitting: Family

## 2022-09-03 ENCOUNTER — Ambulatory Visit (HOSPITAL_BASED_OUTPATIENT_CLINIC_OR_DEPARTMENT_OTHER): Payer: 59 | Admitting: Family

## 2022-09-03 ENCOUNTER — Other Ambulatory Visit
Admission: RE | Admit: 2022-09-03 | Discharge: 2022-09-03 | Disposition: A | Discharge status: Home / Self Care | Payer: 59 | Source: Ambulatory Visit | Attending: Family | Admitting: Family

## 2022-09-03 ENCOUNTER — Other Ambulatory Visit: Payer: Self-pay | Admitting: Family

## 2022-09-03 VITALS — BP 144/85 | HR 62 | Ht 67.0 in | Wt 220.0 lb

## 2022-09-03 DIAGNOSIS — N189 Chronic kidney disease, unspecified: Secondary | ICD-10-CM | POA: Insufficient documentation

## 2022-09-03 DIAGNOSIS — K219 Gastro-esophageal reflux disease without esophagitis: Secondary | ICD-10-CM | POA: Insufficient documentation

## 2022-09-03 DIAGNOSIS — I5022 Chronic systolic (congestive) heart failure: Secondary | ICD-10-CM

## 2022-09-03 DIAGNOSIS — Z79899 Other long term (current) drug therapy: Secondary | ICD-10-CM | POA: Insufficient documentation

## 2022-09-03 DIAGNOSIS — I130 Hypertensive heart and chronic kidney disease with heart failure and stage 1 through stage 4 chronic kidney disease, or unspecified chronic kidney disease: Secondary | ICD-10-CM | POA: Insufficient documentation

## 2022-09-03 DIAGNOSIS — E119 Type 2 diabetes mellitus without complications: Secondary | ICD-10-CM | POA: Diagnosis not present

## 2022-09-03 DIAGNOSIS — I10 Essential (primary) hypertension: Secondary | ICD-10-CM | POA: Diagnosis not present

## 2022-09-03 DIAGNOSIS — H401132 Primary open-angle glaucoma, bilateral, moderate stage: Secondary | ICD-10-CM | POA: Diagnosis not present

## 2022-09-03 DIAGNOSIS — Z8249 Family history of ischemic heart disease and other diseases of the circulatory system: Secondary | ICD-10-CM | POA: Insufficient documentation

## 2022-09-03 DIAGNOSIS — Z7984 Long term (current) use of oral hypoglycemic drugs: Secondary | ICD-10-CM | POA: Insufficient documentation

## 2022-09-03 DIAGNOSIS — E785 Hyperlipidemia, unspecified: Secondary | ICD-10-CM | POA: Insufficient documentation

## 2022-09-03 DIAGNOSIS — Z833 Family history of diabetes mellitus: Secondary | ICD-10-CM | POA: Insufficient documentation

## 2022-09-03 DIAGNOSIS — E782 Mixed hyperlipidemia: Secondary | ICD-10-CM | POA: Diagnosis not present

## 2022-09-03 DIAGNOSIS — Z87442 Personal history of urinary calculi: Secondary | ICD-10-CM | POA: Insufficient documentation

## 2022-09-03 DIAGNOSIS — E1122 Type 2 diabetes mellitus with diabetic chronic kidney disease: Secondary | ICD-10-CM | POA: Insufficient documentation

## 2022-09-03 LAB — BASIC METABOLIC PANEL
Anion gap: 7 (ref 5–15)
BUN: 23 mg/dL (ref 8–23)
CO2: 26 mmol/L (ref 22–32)
Calcium: 9.3 mg/dL (ref 8.9–10.3)
Chloride: 104 mmol/L (ref 98–111)
Creatinine, Ser: 0.83 mg/dL (ref 0.61–1.24)
GFR, Estimated: >60 mL/min (ref >60)
Glucose, Bld: 87 mg/dL (ref 70–99)
Potassium: 4.1 mmol/L (ref 3.5–5.1)
Sodium: 137 mmol/L (ref 135–145)

## 2022-09-03 MED ORDER — SPIRONOLACTONE 25 MG PO TABS: 25.0000 mg | ORAL_TABLET | Freq: Every day | ORAL | 5 refills | Status: DC

## 2022-09-03 NOTE — Progress Notes (Signed)
Patient ID: Robert Mcguire, male    DOB: 07/27/1960, 62 y.o.   MRN: [id number]  HPI  Robert Mcguire is a 62 y/o male with a history of DM, hyperlipidemia, HTN, CKD, GERD, nephrolithiasis and chronic heart failure.   Echo 05/22/22 showed an EF of 35-40% along with elevated left ventricular end-diastolic pressure.  Cardiac CT 06/12/22 was normal.   Was in the ED 08/21/22 due to SOB, thought to be NSTEMI which was ruled out.    He presents today for his initial HF clinic visit with a chief complaint of minimal fatigue with moderate exertion. Describes this as chronic in nature. Has no other symptoms and specifically denies any difficulty sleeping, dizziness, abdominal distention, palpitations, pedal edema, chest pain, wheezing, SOB, cough or weight gain.   Is walking and lifting weights for exercise. [facility] [place] since 01/2022.  Home BP's are trending up. SBP 143-160's and DBP 80's-90's.  Past Medical History:  Diagnosis Date   Allergic rhinitis    Allergy    Arthritis    CHF (congestive heart failure) ([place])    Diabetes mellitus without complication (HCC)    ED (erectile dysfunction)    GERD (gastroesophageal reflux disease)    Glaucoma    High cholesterol    Hyperlipidemia    Hypertension    Impaired glucose tolerance    Nephrolithiasis    Obesity    Past Surgical History:  Procedure Laterality Date   COLONOSCOPY     difficultity with intabation     GYNECOMASTIA EXCISION Bilateral    POLYPECTOMY     right achilles tendon surgery     Family History  Problem Relation Age of Onset   Hypertension Mother    Heart disease Mother        Pacemaker   Obesity Father    Diabetes Father    Hypertension Father    Colon cancer Father 71   Colon polyps Father    High Cholesterol Father    Cancer Father    Healthy Sister    Hypertension Brother    Colon polyps Brother    Hypertension Brother    Hypertension Maternal Grandmother     Diabetes Other    Hypertension Other    Breast cancer Other        breast, colon    Stomach cancer Other        paternal great grand father   Esophageal cancer Neg Hx    Rectal cancer Neg Hx    Social History   Tobacco Use   Smoking status: Never   Smokeless tobacco: Never  Substance Use Topics   Alcohol use: Yes    Alcohol/week: 0.0 standard drinks of alcohol    Comment: occasionally   No Known Allergies Prior to Admission medications   Medication Sig Start Date End Date Taking? Authorizing Provider  alfuzosin (UROXATRAL) 10 MG 24 hr tablet Take 10 mg by mouth daily.   Yes [provider]  aspirin EC 81 MG tablet Take 81 mg by mouth daily.   Yes [provider]  atorvastatin (LIPITOR) 40 MG tablet Take 1 tablet (40 mg total) by mouth daily. 09/30/21  Yes [name], MD  betamethasone dipropionate 0.05 % lotion Apply 1 Application topically 2 (two) times daily. (Apply thin layer to scalp and face)   Yes [provider]  cholecalciferol (VITAMIN D3) 25 MCG (1000 UNIT) tablet Take 1,000 Units by mouth daily.  Yes [provider]  Elderberry 500 MG CAPS Take 1 capsule by mouth daily.   Yes [provider]  empagliflozin (JARDIANCE) 25 MG TABS tablet Take 1 tablet (25 mg total) by mouth daily. 09/30/21  Yes [name], MD  ketoconazole (NIZORAL) 2 % shampoo Apply 1 Application topically 3 (three) times a week.   Yes [provider]  latanoprost (XALATAN) 0.005 % ophthalmic solution Place 1 drop into both eyes every evening.   Yes [provider]  metoprolol succinate (TOPROL-XL) 25 MG 24 hr tablet Take 1 tablet (25 mg total) by mouth daily. Take with or immediately following a meal. 08/22/22 11/20/22 Yes Enzo Bi, MD  Multiple Vitamin (MULTIVITAMIN ADULT PO) Take 1 tablet by mouth daily.   Yes [provider]  pantoprazole (PROTONIX) 20 MG tablet Take 1 tablet (20 mg total) by mouth daily. 08/27/22  Yes  [name], MD  sacubitril-valsartan (ENTRESTO) 49-51 MG Take 1 tablet by mouth 2 (two) times daily. 08/25/22 11/23/22 Yes Enzo Bi, MD  spironolactone (ALDACTONE) 25 MG tablet Take 1 tablet (25 mg total) by mouth daily. 09/03/22  Yes Darylene Price A, FNP  tadalafil (CIALIS) 5 MG tablet Take 5 mg by mouth daily as needed for erectile dysfunction.   Yes [provider]  timolol (TIMOPTIC) 0.5 % ophthalmic solution Place 1 drop into both eyes daily.   Yes [provider]    Review of Systems  Constitutional:  Positive for fatigue (very little). Negative for appetite change.  HENT:  Negative for congestion, postnasal drip and sore throat.   Eyes: Negative.   Respiratory:  Negative for cough, chest tightness, shortness of breath and wheezing.   Cardiovascular:  Negative for chest pain, palpitations and leg swelling.  Gastrointestinal:  Negative for abdominal distention, abdominal pain, nausea and vomiting.  Endocrine: Negative.   Genitourinary: Negative.   Musculoskeletal:  Negative for back pain and neck pain.  Skin: Negative.   Allergic/Immunologic: Negative.   Neurological:  Negative for dizziness and light-headedness.  Hematological:  Negative for adenopathy. Does not bruise/bleed easily.  Psychiatric/Behavioral:  Negative for dysphoric mood and sleep disturbance (sleeping on 2 pillows). The patient is not nervous/anxious.    Vitals:   09/03/22 0823  BP: (!) 144/85  Pulse: 62  SpO2: 99%  Weight: 220 lb (99.8 kg)  Height: '5\' 7"'$  (1.702 m)   Wt Readings from Last 3 Encounters:  09/03/22 220 lb (99.8 kg)  08/27/22 220 lb (99.8 kg)  08/21/22 216 lb (98 kg)   Lab Results  Component Value Date   CREATININE 1.06 08/21/2022   CREATININE 0.86 05/28/2022   CREATININE 0.81 01/29/2022   Physical Exam Vitals and nursing note reviewed. Exam conducted with a chaperone present (wife).  Constitutional:      Appearance: Normal appearance.  HENT:     Head:  Normocephalic and atraumatic.  Cardiovascular:     Rate and Rhythm: Normal rate and regular rhythm.  Pulmonary:     Effort: Pulmonary effort is normal. No respiratory distress.     Breath sounds: No wheezing or rales.  Abdominal:     General: There is no distension.     Palpations: Abdomen is soft.     Tenderness: There is no abdominal tenderness.  Musculoskeletal:        General: No tenderness.     Cervical back: Normal range of motion and neck supple.     Right lower leg: No edema.     Left lower  leg: No edema.  Skin:    General: Skin is warm and dry.  Neurological:     General: No focal deficit present.     Mental Status: He is alert and oriented to person, place, and time.  Psychiatric:        Mood and Affect: Mood normal.        Behavior: Behavior normal.        Thought Content: Thought content normal.    Assessment & Plan:  1: Chronic heart failure with reduced ejection fraction- - NYHA class II - euvolemic today - weighing daily; instructed to call for an overnight weight gain of > 2 pounds or a weekly weight gain of > 5 pounds - adds salt rarely and they've been reading food labels for awhile - jardiance '25mg'$  daily - metoprolol succinate '25mg'$  daily (had been reduced due to bradycardia) - entresto 49/'51mg'$  BID - begin spironolactone '25mg'$  daily - BMP today, 1 week and then next visit - Echo 05/22/22 showed an EF of 35-40% along with elevated left ventricular end-diastolic pressure. - Cardiac CT 06/12/22 was normal - will refer to [facility] provider for further evaluation - walking and lifting weights - saw cardiology Angelena Form) 08/04/22  2: HTN with CKD- - BP 144/885 - saw PCP Volanda Napoleon) 08/27/22 - BMP 08/21/22 showed sodium 135, potassium 3.5, creatinine 1.06 & GFR >60  3: DM- - jardiance '25mg'$  daily - A1c 01/29/22 was 5.3%  4: Hyperlipidemia- - atorvastatin '40mg'$  daily - LDL 01/29/22 was 70   Medication list reviewed.   Return in 1 month, sooner if needed

## 2022-09-03 NOTE — Patient Instructions (Signed)
Continue weighing daily and call for an overnight weight gain of 3 pounds or more or a weekly weight gain of more than 5 pounds.   Start spironolactone '25mg'$  as 1 tablet every morning.   Next Tuesday 3/5 at 8am, you will go to [facility] to get your lab work drawn.    It was a pleasure to meet you today!

## 2022-09-04 ENCOUNTER — Encounter: Payer: Self-pay | Admitting: Family

## 2022-09-08 NOTE — Progress Notes (Unsigned)
Cardiology Office Note  Date:  09/09/2022   ID:  Robert Mcguire, DOB [birth year], MRN [id number]  PCP:  Robert Ruddy, MD   Chief Complaint  Patient presents with   [facility] follow up     "Doing well. Medications reviewed by the patient verbally.     HPI:  [name] is a 62 year old gentleman with past medical history of  nonobstructive coronary artery disease by cardiac CTA December 2023,  ejection fraction 35 to 40%/nonischemic cardiomyopathy,  Cardiac CTA with nonobstructive disease December 2023 Previously followed by cardiology in [place],  diabetes type 2,  hypertension,  hyperlipidemia  Recent hospitalization at [facility] for non-STEMI nausea vomiting diarrhea, elevated troponin Who presents for follow-up of his cardiomyopathy, after recent hospitalization  Recent events reviewed woke up August 21, 2022 at 1:30 AM to have a bowel movement.  Appreciated some shortness of breath, developed several episodes of emesis.  Appreciated diaphoresis, wife appreciated that he was clammy/sweating.  911 called, additional episode of emesis on route.  In the emergency room reports having bowel movement, then a second bowel movement with diarrhea. troponin of 12, repeat 50 Felt to have vasovagal spell, exacerbated by medications and bradycardia  Echo November 2023 Left ventricular ejection fraction, by estimation, is 35 to 40%. The  left ventricle has moderately decreased function. The left ventricle  demonstrates global hypokinesis and regionally worse function in the  inferior wall base and mid, as well as the  anterior and lateral apex. The left ventricular internal cavity size was  mildly dilated. There is mild left ventricular hypertrophy. Left  ventricular diastolic parameters are indeterminate. Elevated left  ventricular end-diastolic pressure.   Started on Entresto, metoprolol Seen in CHF clinic last week, started on spironolactone for high blood pressure Reports on the  spironolactone his blood pressure has improved somewhat now running 1 [id number] 30 systolic  Periodic abdominal cramps, maybe 2 times with no orthostasis symptoms Feels he is active, likes to go golfing, does weights, regular walking program No PND orthopnea, no leg swelling  EKG personally reviewed by myself on todays visit Normal sinus rhythm rate 63 bpm poor R wave progression to the anterior precordial leads, left anterior fascicular block  PMH:   has a past medical history of Allergic rhinitis, Allergy, Arthritis, CHF (congestive heart failure) ([place]), Diabetes mellitus without complication ([place]), ED (erectile dysfunction), GERD (gastroesophageal reflux disease), Glaucoma, High cholesterol, Hyperlipidemia, Hypertension, Impaired glucose tolerance, Nephrolithiasis, and Obesity.  PSH:    Past Surgical History:  Procedure Laterality Date   COLONOSCOPY     difficultity with intabation     GYNECOMASTIA EXCISION Bilateral    POLYPECTOMY     right achilles tendon surgery      Current Outpatient Medications  Medication Sig Dispense Refill   alfuzosin (UROXATRAL) 10 MG 24 hr tablet Take 10 mg by mouth daily.     aspirin EC 81 MG tablet Take 81 mg by mouth daily.     atorvastatin (LIPITOR) 40 MG tablet Take 1 tablet (40 mg total) by mouth daily. 90 tablet 3   betamethasone dipropionate 0.05 % lotion Apply 1 Application topically 2 (two) times daily. (Apply thin layer to scalp and face)     cholecalciferol (VITAMIN D3) 25 MCG (1000 UNIT) tablet Take 1,000 Units by mouth daily.     Elderberry 500 MG CAPS Take 1 capsule by mouth daily.     empagliflozin (JARDIANCE) 25 MG TABS tablet Take 1 tablet (25 mg total) by mouth daily. [place]  tablet 3   ketoconazole (NIZORAL) 2 % shampoo Apply 1 Application topically 3 (three) times a week.     latanoprost (XALATAN) 0.005 % ophthalmic solution Place 1 drop into both eyes every evening.     metoprolol succinate (TOPROL-XL) 25 MG 24 hr tablet Take 1 tablet (25 mg  total) by mouth daily. Take with or immediately following a meal. 30 tablet 2   Multiple Vitamin (MULTIVITAMIN ADULT PO) Take 1 tablet by mouth daily.     pantoprazole (PROTONIX) 20 MG tablet Take 1 tablet (20 mg total) by mouth daily. 30 tablet 3   sacubitril-valsartan (ENTRESTO) 49-51 MG Take 1 tablet by mouth 2 (two) times daily. 60 tablet 2   spironolactone (ALDACTONE) 25 MG tablet Take 1 tablet (25 mg total) by mouth daily. 30 tablet 5   tadalafil (CIALIS) 5 MG tablet Take 5 mg by mouth daily as needed for erectile dysfunction.     timolol (TIMOPTIC) 0.5 % ophthalmic solution Place 1 drop into both eyes daily.     No current facility-administered medications for this visit.     Allergies:   Patient has no known allergies.   Social History:  The patient  reports that he has never smoked. He has never used smokeless tobacco. He reports current alcohol use. He reports that he does not use drugs.   Family History:   family history includes Breast cancer in an other family member; Cancer in his father; Colon cancer (age of onset: 27) in his father; Colon polyps in his brother and father; Diabetes in his father and another family member; Healthy in his sister; Heart disease in his mother; High Cholesterol in his father; Hypertension in his brother, brother, father, maternal grandmother, mother, and another family member; Obesity in his father; Stomach cancer in an other family member.    Review of Systems: Review of Systems  Constitutional: Negative.   HENT: Negative.    Respiratory: Negative.    Cardiovascular: Negative.   Gastrointestinal: Negative.   Musculoskeletal: Negative.   Neurological: Negative.   Psychiatric/Behavioral: Negative.    All other systems reviewed and are negative.   PHYSICAL EXAM: VS:  BP 130/80 (BP Location: Left Arm, Patient Position: Sitting, Cuff Size: Normal)   Pulse 63   Ht '5\' 7"'$  (1.702 m)   Wt 219 lb 2 oz (99.4 kg)   SpO2 98%   BMI 34.32 kg/m  ,  BMI Body mass index is 34.32 kg/m. GEN: Well nourished, well developed, in no acute distress HEENT: normal Neck: no JVD, carotid bruits, or masses Cardiac: RRR; no murmurs, rubs, or gallops,no edema  Respiratory:  clear to auscultation bilaterally, normal work of breathing GI: soft, nontender, nondistended, + BS MS: no deformity or atrophy Skin: warm and dry, no rash Neuro:  Strength and sensation are intact Psych: euthymic mood, full affect   Recent Labs: 01/29/2022: TSH 1.810 08/21/2022: ALT 20; Magnesium 2.4 08/22/2022: Hemoglobin 15.2; Platelets 154 09/09/2022: BUN 23; Creatinine, Ser 0.89; Potassium 4.3; Sodium 133    Lipid Panel Lab Results  Component Value Date   CHOL 127 01/29/2022   HDL 44 01/29/2022   LDLCALC 70 01/29/2022   TRIG 63 01/29/2022      Wt Readings from Last 3 Encounters:  09/09/22 219 lb 2 oz (99.4 kg)  09/03/22 220 lb (99.8 kg)  08/27/22 220 lb (99.8 kg)       ASSESSMENT AND PLAN:  Problem List Items Addressed This Visit       Cardiology Problems  NICM (nonischemic cardiomyopathy) ([place])   Mixed hyperlipidemia     Other   Vasovagal episode   Type 2 diabetes mellitus without complication, without long-term current use of insulin (HCC)   Other Visit Diagnoses     Chronic systolic heart failure ([place])    -  Primary   Primary hypertension       Sinus bradycardia          Cardiomyopathy, nonischemic Possible hypertensive heart disease Noted on workup with [facility] cardiology echocardiogram and cardiac CTA completed Vasovagal spell with gastroenteritis with hospitalization February 15, minimally elevated troponin Started on goal-directed medical therapy for reduced ejection fraction In follow-up today reports feeling well with no complaints No medication changes made given spironolactone 25 recently started Recommend he continue to monitor blood pressure and if this runs high could increase Entresto up to 1-1/2 pills twice daily For  now we will continue metoprolol, Jardiance, spironolactone, Entresto Currently not on Lasix, appears euvolemic  Essential hypertension Stressed importance of aggressive blood pressure control  Bradycardia Noted to have significant bradycardia in the hospital, dose of metoprolol was decreased from 100 down to 25 daily  Diabetes type 2 A1c running 5.3, well-controlled   Total encounter time more than 40 minutes  Greater than 50% was spent in counseling and coordination of care with the patient    Signed, Esmond Plants, M.D., Ph.D. [place], [place]

## 2022-09-09 ENCOUNTER — Encounter
Admission: RE | Admit: 2022-09-09 | Discharge: 2022-09-09 | Disposition: A | Discharge status: Home / Self Care | Payer: 59 | Source: Ambulatory Visit | Attending: Family | Admitting: Family

## 2022-09-09 ENCOUNTER — Encounter: Payer: Self-pay | Admitting: Cardiovascular Disease

## 2022-09-09 ENCOUNTER — Other Ambulatory Visit
Admission: RE | Admit: 2022-09-09 | Discharge: 2022-09-09 | Disposition: A | Discharge status: Home / Self Care | Payer: 59 | Source: Ambulatory Visit | Attending: Family | Admitting: Family

## 2022-09-09 ENCOUNTER — Ambulatory Visit: Payer: 59 | Attending: Cardiovascular Disease | Admitting: Cardiovascular Disease

## 2022-09-09 VITALS — BP 130/80 | HR 63 | Ht 67.0 in | Wt 219.1 lb

## 2022-09-09 DIAGNOSIS — I5022 Chronic systolic (congestive) heart failure: Secondary | ICD-10-CM | POA: Diagnosis not present

## 2022-09-09 DIAGNOSIS — R001 Bradycardia, unspecified: Secondary | ICD-10-CM | POA: Diagnosis not present

## 2022-09-09 DIAGNOSIS — I428 Other cardiomyopathies: Secondary | ICD-10-CM | POA: Diagnosis not present

## 2022-09-09 DIAGNOSIS — I10 Essential (primary) hypertension: Secondary | ICD-10-CM | POA: Diagnosis not present

## 2022-09-09 DIAGNOSIS — E782 Mixed hyperlipidemia: Secondary | ICD-10-CM | POA: Diagnosis not present

## 2022-09-09 DIAGNOSIS — R55 Syncope and collapse: Secondary | ICD-10-CM

## 2022-09-09 DIAGNOSIS — E119 Type 2 diabetes mellitus without complications: Secondary | ICD-10-CM

## 2022-09-09 LAB — BASIC METABOLIC PANEL
Anion gap: 2 — ABNORMAL LOW (ref 5–15)
BUN: 23 mg/dL (ref 8–23)
CO2: 25 mmol/L (ref 22–32)
Calcium: 9.5 mg/dL (ref 8.9–10.3)
Chloride: 106 mmol/L (ref 98–111)
Creatinine, Ser: 0.89 mg/dL (ref 0.61–1.24)
GFR, Estimated: >60 mL/min (ref >60)
Glucose, Bld: 102 mg/dL — ABNORMAL HIGH (ref 70–99)
Potassium: 4.3 mmol/L (ref 3.5–5.1)
Sodium: 133 mmol/L — ABNORMAL LOW (ref 135–145)

## 2022-09-09 MED ORDER — SACUBITRIL-VALSARTAN 49-51 MG PO TABS: 1.0000 | ORAL_TABLET | Freq: Two times a day (BID) | ORAL | 3 refills | Status: DC

## 2022-09-09 MED ORDER — METOPROLOL SUCCINATE ER 25 MG PO TB24: 25.0000 mg | ORAL_TABLET | Freq: Every day | ORAL | 3 refills | Status: DC

## 2022-09-09 MED ORDER — SPIRONOLACTONE 25 MG PO TABS: 25.0000 mg | ORAL_TABLET | Freq: Every day | ORAL | 3 refills | Status: DC

## 2022-09-09 NOTE — Patient Instructions (Signed)
Medication Instructions:  No changes  If you need a refill on your cardiac medications before your next appointment, please call your pharmacy.    Lab work: No new labs needed   Testing/Procedures: No new testing needed   Follow-Up: At [facility], you and your health needs are our priority.  As part of our continuing mission to provide you with exceptional heart care, we have created designated Provider Care Teams.  These Care Teams include your primary Cardiologist (physician) and Advanced Practice Providers (APPs -  Physician Assistants and Nurse Practitioners) who all work together to provide you with the care you need, when you need it.  You will need a follow up appointment in 6 months  Providers on your designated Care Team:   Christopher Berge, NP Ryan Dunn, PA-C Cadence Furth, PA-C  COVID-19 Vaccine Information can be found at: [email or web address] For questions related to vaccine distribution or appointments, please email [email or web address] or call [phone].   

## 2022-09-10 ENCOUNTER — Ambulatory Visit (INDEPENDENT_AMBULATORY_CARE_PROVIDER_SITE_OTHER): Payer: 59 | Admitting: Family Medicine

## 2022-09-11 DIAGNOSIS — H2513 Age-related nuclear cataract, bilateral: Secondary | ICD-10-CM | POA: Diagnosis not present

## 2022-09-11 DIAGNOSIS — H401132 Primary open-angle glaucoma, bilateral, moderate stage: Secondary | ICD-10-CM | POA: Diagnosis not present

## 2022-09-16 ENCOUNTER — Ambulatory Visit (INDEPENDENT_AMBULATORY_CARE_PROVIDER_SITE_OTHER): Payer: 59 | Admitting: Family Medicine

## 2022-09-16 ENCOUNTER — Encounter (INDEPENDENT_AMBULATORY_CARE_PROVIDER_SITE_OTHER): Payer: Self-pay | Admitting: Family Medicine

## 2022-09-16 VITALS — BP 149/78 | HR 80 | Temp 98.2°F | Ht 67.0 in | Wt 214.0 lb

## 2022-09-16 DIAGNOSIS — E669 Obesity, unspecified: Secondary | ICD-10-CM

## 2022-09-16 DIAGNOSIS — I2510 Atherosclerotic heart disease of native coronary artery without angina pectoris: Secondary | ICD-10-CM

## 2022-09-16 DIAGNOSIS — E1169 Type 2 diabetes mellitus with other specified complication: Secondary | ICD-10-CM | POA: Diagnosis not present

## 2022-09-16 DIAGNOSIS — Z6833 Body mass index (BMI) 33.0-33.9, adult: Secondary | ICD-10-CM

## 2022-09-16 DIAGNOSIS — I10 Essential (primary) hypertension: Secondary | ICD-10-CM | POA: Diagnosis not present

## 2022-09-16 DIAGNOSIS — Z7984 Long term (current) use of oral hypoglycemic drugs: Secondary | ICD-10-CM

## 2022-09-17 ENCOUNTER — Telehealth: Payer: Self-pay | Admitting: Cardiovascular Disease

## 2022-09-17 NOTE — Telephone Encounter (Signed)
Pt c/o BP issue: STAT if pt c/o blurred vision, one-sided weakness or slurred speech  1. What are your last 5 BP readings? 10:20am it was 137/82 but pt has been averaging 135/82  2. Are you having any other symptoms (ex. Dizziness, headache, blurred vision, passed out)?  no 3. What is your BP issue? Pt feels that its elevated and seen wellness doc yesterday and it was a little higher 141/83 at that appt. Pt stated provider said give him a call if BP continued to be elevated. Please advise.

## 2022-09-18 ENCOUNTER — Telehealth: Payer: Self-pay | Admitting: Family Medicine

## 2022-09-18 ENCOUNTER — Telehealth: Payer: Self-pay

## 2022-09-18 DIAGNOSIS — E1169 Type 2 diabetes mellitus with other specified complication: Secondary | ICD-10-CM

## 2022-09-18 MED ORDER — EMPAGLIFLOZIN 25 MG PO TABS: 25.0000 mg | ORAL_TABLET | Freq: Every day | ORAL | 3 refills | Status: DC

## 2022-09-18 NOTE — Telephone Encounter (Signed)
Left detailed message on patient's voicemail stating the following from Dr. Rockey Situ:  For elevated blood pressure would recommend we increase the Entresto up to 1.5 pills twice a day (1-1/2 of a 49/51 mg pill) Continue to monitor blood pressure If it continues to run mildly high, they make a 97/103 mg twice daily dosing pill we might change to [employer]

## 2022-09-18 NOTE — Progress Notes (Signed)
   09/18/2022  Patient ID: Robert Mcguire, male   DOB: Feb 18, 1961, 62 y.o.   MRN: [id number]  Patient appearing on report for [facility] - Hypertension Control report due to last documented ambulatory blood pressure of 149/78 on 3/12. Next appointment with PCP is 4/12; cardiology 4/10    Outreached patient to discuss hypertension control and medication management.   Current antihypertensives: metoprolol xl 25mg  daily, Entresto 49/51mg  daily, sprinolactone 25mg  daily -Patient has an automated upper arm home BP machine. -Patient takes home BP regularly, records, and takes to cardiology appointments -BP and HLD are being managed by cardiology -Patient does not endorse any barriers to adherence such as access or affordability of medications.  -Has copay cards for Entresto and Jardiance.  Reports good adherence to regimen. -Patient denies side effects related to current regimen -Days he takes sildenafil he does not take daily dose of tadalafil   Assessment/Plan: - Currently uncontrolled - - Reviewed appropriate administration of medication regimen - Reviewed to check blood pressure daily, document, and provide at next provider visit - Recommend regular follow-up with PCP and cardiology  Darlina Guys, PharmD, DPLA

## 2022-09-18 NOTE — Telephone Encounter (Signed)
Prescription Request  09/18/2022  LOV: 08/27/2022  What is the name of the medication or equipment? empagliflozin (JARDIANCE) 25 MG TABS tablet   Have you contacted your pharmacy to request a refill? Yes   Which pharmacy would you like this sent to?  [id number] [address]Phone: [phone]: [phone]  Patient notified that their request is being sent to the clinical staff for review and that they should receive a response within 2 business days.   Please advise at Mobile [phone](mobile)

## 2022-09-18 NOTE — Addendum Note (Signed)
Addended byEncarnacion Slates on: 09/18/2022 05:02 PM   Modules accepted: Orders

## 2022-09-23 ENCOUNTER — Other Ambulatory Visit: Payer: Self-pay

## 2022-09-23 NOTE — Telephone Encounter (Signed)
Pt called to inform MD that this Rx has been denied by [employer].  Pt formerly had [facility].  Pt states he has been taking this medication for 6+years and would like to know what advice &/or assistance MD could provide to get this Rx approved.  Pt states he only has 6 days worth of this medication left.   Pt added that his cardiologist has also advised him to continue taking this medication, as well.  Please advise.

## 2022-09-24 ENCOUNTER — Telehealth: Payer: Self-pay

## 2022-09-24 ENCOUNTER — Encounter: Payer: Self-pay | Admitting: Cardiovascular Disease

## 2022-09-24 ENCOUNTER — Other Ambulatory Visit: Payer: Self-pay | Admitting: Family Medicine

## 2022-09-24 DIAGNOSIS — E1169 Type 2 diabetes mellitus with other specified complication: Secondary | ICD-10-CM

## 2022-09-24 NOTE — Progress Notes (Unsigned)
Chief Complaint:   OBESITY Robert Mcguire is here to discuss his progress with his obesity treatment plan along with follow-up of his obesity related diagnoses. Kashius is on the Category 2 Plan and following a lower carbohydrate, vegetable and lean protein rich diet plan and states he is following his eating plan approximately 60% of the time. Qwinton states he is walking 4 times per week.    Today's visit was #: 9 Starting weight: 232 lbs Starting date: 01/28/2022 Today's weight: 214 lbs Today's date: 09/16/2022 Total lbs lost to date: 18 Total lbs lost since last in-office visit: 4  Interim History: Kawaski was in the hospital since his last visit, and he was diagnosed with a NSTEMI.  He is feeling better now, and he has done well with his weight loss.  Subjective:   1. Essential hypertension Jai's blood pressure remains elevated again today.  He is followed by cardiology and he is working on his diet.  He is on Toprol at night and Entresto in the daytime.  He is on Jardiance for glucose and congestive heart failure.  2. Coronary artery disease, unspecified vessel or lesion type, unspecified whether angina present, unspecified whether native or transplanted heart Ajmal is status post NSTEMI last month.  He was asked if he understood what that diagnosis meant and he requested more information.  3. Type 2 diabetes mellitus with other specified complication, without long-term current use of insulin (HCC) Mussa is on [place] and he is working on his diet.  His last A1c and July 2023 was well-controlled at 5.3.  The risk of diabetes mellitus and tight glucose control was explained.  Assessment/Plan:   1. Essential hypertension Montavius is to send his cardiologist, Dr. Angelena Form a note informing him that his blood pressure remains elevated.  He will continue to work on his diet and weight loss, and we will follow closely.  2. Coronary artery disease, unspecified vessel or lesion type, unspecified whether angina  present, unspecified whether native or transplanted heart Joathan was educated about myocardial infraction and what type he had as well as his risk factors, and the importance of tight control of these risk factors.  All questions were answered and the patient will continue to work on his diet, exercise, and weight loss to decrease the risk of worsening coronary artery disease.  3. Type 2 diabetes mellitus with other specified complication, without long-term current use of insulin (HCC) Atha will continue to work on decreasing simple carbohydrates and cholesterol in his diet, and we will follow-up in 1 month to reassess.  4. BMI 33.0-33.9,adult  5. Obesity, Beginning BMI 36.34 Caziah is currently in the action stage of change. As such, his goal is to continue with weight loss efforts. He has agreed to the Category 3 Plan.   Exercise goals: As is.   Behavioral modification strategies: increasing lean protein intake.  Kholton has agreed to follow-up with our clinic in 3 to 4 weeks. He was informed of the importance of frequent follow-up visits to maximize his success with intensive lifestyle modifications for his multiple health conditions.   Objective:   Blood pressure (!) 149/78, pulse 80, temperature 98.2 F (36.8 C), height 5\' 7"  (1.702 m), weight 214 lb (97.1 kg), SpO2 99 %. Body mass index is 33.52 kg/m.  Lab Results  Component Value Date   CREATININE 0.89 09/09/2022   BUN 23 09/09/2022   NA 133 (L) 09/09/2022   K 4.3 09/09/2022   CL 106 09/09/2022  CO2 25 09/09/2022   Lab Results  Component Value Date   ALT 20 08/21/2022   AST 24 08/21/2022   ALKPHOS 50 08/21/2022   BILITOT 1.6 (H) 08/21/2022   Lab Results  Component Value Date   HGBA1C 5.3 01/29/2022   HGBA1C 5.6 09/30/2021   HGBA1C 5.5 03/15/2021   HGBA1C 5.4 09/20/2020   HGBA1C 5.4 06/08/2020   Lab Results  Component Value Date   INSULIN 10.3 01/29/2022   Lab Results  Component Value Date   TSH 1.810 01/29/2022    Lab Results  Component Value Date   CHOL 127 01/29/2022   HDL 44 01/29/2022   LDLCALC 70 01/29/2022   LDLDIRECT 168.0 05/13/2016   TRIG 63 01/29/2022   CHOLHDL 3 09/30/2021   Lab Results  Component Value Date   VD25OH 35.6 01/29/2022   VD25OH 33.70 09/20/2020   Lab Results  Component Value Date   WBC 5.0 08/22/2022   HGB 15.2 08/22/2022   HCT 44.2 08/22/2022   MCV 89.8 08/22/2022   PLT 154 08/22/2022   No results found for: "IRON", "TIBC", "FERRITIN"  Attestation Statements:   Reviewed by clinician on day of visit: allergies, medications, problem list, medical history, surgical history, family history, social history, and previous encounter notes.  Time spent on visit including pre-visit chart review and post-visit care and charting was 45 minutes.   I, Trixie Dredge, am acting as transcriptionist for Dennard Nip, MD.  I have reviewed the above documentation for accuracy and completeness, and I agree with the above. -  Dennard Nip, MD

## 2022-09-24 NOTE — Progress Notes (Signed)
   09/24/2022  Patient ID: Robert Mcguire, male   DOB: 1960-09-27, 63 y.o.   MRN: [id number]  Outreach attempt to return voicemail regarding medication question/concern.  Was unable to reach patient but left a message for him to return my call at his convenience.    Darlina Guys, PharmD, DPLA

## 2022-09-24 NOTE — Telephone Encounter (Signed)
Pt can ask his insurance company which medication they will cover.  Patient can also appeal the denial decision or pay for the medication out-of-pocket.

## 2022-09-24 NOTE — Progress Notes (Signed)
   09/24/2022  Patient ID: Robert Mcguire, male   DOB: 11-26-1960, 62 y.o.   MRN: [id number]  Subjective/Objective: Patient called to inform me he received notification that Vania Rea was not covered on his insurance.   -He states his plan changed at the end of last year, and the new [employer] coverage is not paying for the medication.   -He was told prior authorization was initiated by his pharmacy but was denied. -Patient currently has another week supply of medication on hand. -Of note: this is a continuation of therapy for DM and CHF, and the patient has previously tried metformin in the past.  Assessment/Plan: -Contacted pharmacy, and they did not have record of prior authorization being sent for approval/denial.   -They have started this process and are routing to Dr. Volanda Napoleon office for completion and to send to plan for final decision. -Contacting Dr. Volanda Napoleon to make them aware to be on the lookout and offer any assistance I may be able to provide in the process -Notifying patient that prior authorization has now been initiated  Follow-up:  Will monitor progress of PA and keep patient notified.  Darlina Guys, PharmD, DPLA

## 2022-09-24 NOTE — Telephone Encounter (Signed)
Contacted patient. Inform him, an appeal was sent in. Waiting on a status.   Pt is aware of message below.   Pt reports he has enough pills till next week. Advise him we will keep him update. Verbalized understanding.

## 2022-09-25 NOTE — Telephone Encounter (Signed)
PA was denied on 09/22/2022. Still waiting on a status of appeal for Jardiance.

## 2022-10-01 ENCOUNTER — Ambulatory Visit: Payer: 59 | Admitting: Nurse Practitioner

## 2022-10-01 ENCOUNTER — Encounter: Payer: Self-pay | Admitting: Nurse Practitioner

## 2022-10-01 VITALS — BP 130/80 | Ht 67.0 in | Wt 219.0 lb

## 2022-10-01 DIAGNOSIS — R112 Nausea with vomiting, unspecified: Secondary | ICD-10-CM

## 2022-10-01 NOTE — Progress Notes (Addendum)
10/01/2022 Robert Mcguire ED:[id number] 1960/11/26   CHIEF COMPLAINT: Possible GERD  HISTORY OF PRESENT ILLNESS: Robert Mcguire is a 62 year old male with a past medical history of arthritis, hypertension, hyperlipidemia, nonobstructive coronary artery disease per CTA, nonischemic cardiomyopathy with LVEF 35 - [id number], chronic systolic CHF, DM type II, glaucoma, GERD and tubulovillous/tubular adenomatous and sessile serrated colon polyps. He is known by Dr. Loletha Carrow. He presents today for further evaluation regarding nausea and vomiting. He is accompanied by his wife. He presented to the ED on 08/21/2022 due to having mid to lower chest pressure which occurred after he awakened in the middle of the night with nausea/vomiting and diarrhea. No hematemesis.  He initially thought he had food poisoning, no obvious food triggers. WBC 6.0.  Hemoglobin 15.5.  Total bili 1.6 with normal alk phos and AST/ALT levels. RUQ sonogram was negative for gallstones or CBD dilatation. EKG showed a normal sinus rhythm with T wave inversion in the lateral leads (unchanged from EKG 04/2022) with a troponin level 12 -> 54. He was started on a Heparin drip for possible for ACS. He was seen by cardiology who felt he likely had demand ischemia in setting of hypotension secondary to vasovagal event with nausea/vomiting and diarrhea and not ACS. Heparin infusion was dc'd and he was discharged home with instruction to continue aspirin, Lipitor and he was prescribed Pantoprazole 40 mg daily.  A GI consult was requested for further evaluation.  He denies having any further episodes of nausea, vomiting or diarrhea.  No heartburn or dysphagia.  No prior history of GERD symptoms.  He stopped taking Pantoprazole approximately 10 days ago.  He is passing normal formed brown bowel movement daily.  No rectal bleeding or black stools.  His most recent colonoscopy was 12/23/2018 which identified 2 sessile serrated polyps removed at the hepatic flexure.  He  was advised to repeat a colonoscopy 12/2023.  Father with history of colon cancer.  Brother with history of colon polyps.     Latest Ref Rng & Units 08/22/2022    4:36 AM 08/21/2022    3:03 AM 09/30/2021   10:20 AM  CBC  WBC 4.0 - 10.5 K/uL 5.0  6.0  4.1   Hemoglobin 13.0 - 17.0 g/dL 15.2  15.5  16.3   Hematocrit 39.0 - 52.0 % 44.2  45.3  48.3   Platelets 150 - 400 K/uL 154  173  162.0        Latest Ref Rng & Units 09/09/2022    8:44 AM 09/03/2022    9:20 AM 08/21/2022    3:03 AM  CMP  Glucose 70 - 99 mg/dL 102  87  178   BUN 8 - 23 mg/dL 23  23  24    Creatinine 0.61 - 1.24 mg/dL 0.89  0.83  1.06   Sodium 135 - 145 mmol/L 133  137  135   Potassium 3.5 - 5.1 mmol/L 4.3  4.1  3.5   Chloride 98 - 111 mmol/L 106  104  104   CO2 22 - 32 mmol/L 25  26  22    Calcium 8.9 - 10.3 mg/dL 9.5  9.3  9.5   Total Protein 6.5 - 8.1 g/dL   7.1   Total Bilirubin 0.3 - 1.2 mg/dL   1.6   Alkaline Phos 38 - 126 U/L   50   AST 15 - 41 U/L   24   ALT 0 - 44 U/L   20  RUQ sonogram 08/21/2022: Gallbladder: Minimal distension of the gallbladder. No detected stone and no focal tenderness   Common bile duct: Diameter: 3 mm   Liver: No focal lesion identified. Within normal limits in parenchymal echogenicity. Portal vein is patent on color Doppler imaging with normal direction of blood flow towards the liver.   Other: 3.5 cm right renal cyst known from a 2015 abdominal CT. There is partial visualization due to shadowing rib. No imaging follow-up recommended.   IMPRESSION: Negative right upper quadrant ultrasound   Coronary CTA 06/12/2022: IMPRESSION: 1. Coronary calcium score of 3.42. This was 29% percentile for age and sex matched control.   2. Normal coronary origin with right dominance.   3. Minimal LAD stenosis (<25%).   4. CAD-RADS 1.  Minimal non-obstructive CAD (0-24%).  ECHO 05/23/2022: Left ventricular ejection fraction, by estimation, is 35 to 40%. The left ventricle has  moderately decreased function. The left ventricle demonstrates global hypokinesis and regionally worse function in the inferior wall base and mid, as well as the anterior and lateral apex. The left ventricular internal cavity size was mildly dilated. There is mild left ventricular hypertrophy. Left ventricular diastolic parameters are indeterminate. Elevated left ventricular end-diastolic pressure. 1. Right ventricular systolic function is normal. The right ventricular size is normal. There is normal pulmonary artery systolic pressure. The estimated right ventricular systolic pressure is [id number] mmHg. 2. 3. Right atrial size was mildly dilated. The mitral valve is normal in structure. Trivial mitral valve regurgitation. No evidence of mitral stenosis. 4. The aortic valve is tricuspid. Aortic valve regurgitation is not visualized. No aortic stenosis is present. 5. The inferior vena cava is normal in size with greater than 50% respiratory variability, suggesting right atrial pressure of 3 mmHg.   PAST GI PROCEDURES:  12/23/2018 by Dr. Loletha Carrow: - Two diminutive polyps at the hepatic flexure, removed with a cold biopsy forceps. Resected and retrieved.  - The examination was otherwise normal on direct and retroflexion views. Impression:  - Patient has a contact number available for emergencies.  - Recall colonoscopy 5 years  Surgical [P], colon, transverse, polyp (2) - SESSILE SERRATED POLYP(S) WITHOUT DYSPLASIA. - NO HIGH GRADE DYSPLASIA OR MALIGNANCY.  Colonoscopy 11/01/2015: - Four 4 to 8 mm polyps in the proximal sigmoid colon, in the distal transverse colon and at the hepatic flexure, removed with a cold snare. Resected and retrieved.  - One 10 mm polyp in the proximal descending colon, removed with a hot snare. Resected and retrieved.  - The examination was otherwise normal on direct and retroflexion views. 1. Surgical [P], sigmoid, polyp (2) - TUBULAR ADENOMA(X2). - HIGH GRADE DYSPLASIA IS NOT  IDENTIFIED. 2. Surgical [P], hepatic flexure, transverse, polyp (2) - TUBULAR ADENOMA(X2). - HIGH GRADE DYSPLASIA IS NOT IDENTIFIED. 3. Surgical [P], descending, polyp - TUBULOVILLOUS ADENOMA. - HIGH GRADE DYSPLASIA IS NOT IDENTIFIED.  Past Medical History:  Diagnosis Date   Allergic rhinitis    Allergy    Arthritis    CHF (congestive heart failure) ([place])    Diabetes mellitus without complication (HCC)    ED (erectile dysfunction)    GERD (gastroesophageal reflux disease)    Glaucoma    High cholesterol    Hyperlipidemia    Hypertension    Impaired glucose tolerance    Nephrolithiasis    Obesity    Past Surgical History:  Procedure Laterality Date   COLONOSCOPY     difficultity with intabation     GYNECOMASTIA EXCISION Bilateral    POLYPECTOMY  right achilles tendon surgery     Social History: He is married.  Retired.  Non-smoker.  He drinks a few beers weekly.  No drug use.  Family History: family history includes Breast cancer in an other family member; Cancer in his father; Colon cancer (age of onset: 58) in his father; Colon polyps in his brother and father; Diabetes in his father and another family member; Healthy in his sister; Heart disease in his mother; High Cholesterol in his father; Hypertension in his brother, brother, father, maternal grandmother, mother, and another family member; Obesity in his father; Stomach cancer in an other family member. Paternal grandfather had stomach cancer diagnosed around the age of 45.  Paternal grandmother had multiple myeloma.   No Known Allergies    Outpatient Encounter Medications as of 10/01/2022  Medication Sig   alfuzosin (UROXATRAL) 10 MG 24 hr tablet Take 10 mg by mouth daily.   aspirin EC 81 MG tablet Take 81 mg by mouth daily.   atorvastatin (LIPITOR) 40 MG tablet Take 1 tablet (40 mg total) by mouth daily.   betamethasone dipropionate 0.05 % lotion Apply 1 Application topically 2 (two) times daily. (Apply thin  layer to scalp and face)   cholecalciferol (VITAMIN D3) 25 MCG (1000 UNIT) tablet Take 1,000 Units by mouth daily.   Elderberry 500 MG CAPS Take 1 capsule by mouth daily.   empagliflozin (JARDIANCE) 25 MG TABS tablet Take 1 tablet (25 mg total) by mouth daily.   ketoconazole (NIZORAL) 2 % shampoo Apply 1 Application topically 3 (three) times a week.   latanoprost (XALATAN) 0.005 % ophthalmic solution Place 1 drop into both eyes every evening.   metoprolol succinate (TOPROL-XL) 25 MG 24 hr tablet Take 1 tablet (25 mg total) by mouth daily. Take with or immediately following a meal.   Multiple Vitamin (MULTIVITAMIN ADULT PO) Take 1 tablet by mouth daily.   sacubitril-valsartan (ENTRESTO) 49-51 MG Take 1 tablet by mouth 2 (two) times daily.   sildenafil (VIAGRA) 50 MG tablet Take 50 mg by mouth as needed for erectile dysfunction.   spironolactone (ALDACTONE) 25 MG tablet Take 1 tablet (25 mg total) by mouth daily.   tadalafil (CIALIS) 5 MG tablet Take 5 mg by mouth daily.   timolol (TIMOPTIC) 0.5 % ophthalmic solution Place 1 drop into both eyes daily.   pantoprazole (PROTONIX) 20 MG tablet Take 1 tablet (20 mg total) by mouth daily. (Patient not taking: Reported on 10/01/2022)   No facility-administered encounter medications on file as of 10/01/2022.    REVIEW OF SYSTEMS:  Gen: Denies fever, sweats or chills. No weight loss.  CV: No current chest pain. Resp: Denies cough, shortness of breath of hemoptysis.  GI: See HPI. GU : + Excessive urination. MS: Denies joint pain, muscles aches or weakness. Derm: Denies rash, itchiness, skin lesions or unhealing ulcers. Psych: Denies depression, anxiety, memory loss or confusion. Heme: Denies bruising, easy bleeding. Neuro:  Denies headaches, dizziness or paresthesias. Endo: + Diabetes.  PHYSICAL EXAM: BP 130/80   Ht 5\' 7"  (1.702 m)   Wt 219 lb (99.3 kg)   BMI 34.30 kg/m   General: 62 year old male in no acute distress. Head: Normocephalic  and atraumatic. Eyes:  Sclerae non-icteric, conjunctive pink. Ears: Normal auditory acuity. Mouth: Dentition intact. No ulcers or lesions.  Neck: Supple, no lymphadenopathy or thyromegaly.  Lungs: Clear bilaterally to auscultation without wheezes, crackles or rhonchi. Heart: Regular rate and rhythm. No murmur, rub or gallop appreciated.  Abdomen:  Soft, nontender, nondistended. No masses. No hepatosplenomegaly. Normoactive bowel sounds x 4 quadrants.  Rectal: Deferred. Musculoskeletal: Symmetrical with no gross deformities. Skin: Warm and dry. No rash or lesions on visible extremities. Extremities: No edema. Neurological: Alert oriented x 4, no focal deficits.  Psychological:  Alert and cooperative. Normal mood and affect.  ASSESSMENT AND PLAN:  62 year old male who awakened with nausea/vomiting/diarrhea and mid to lower chest pain then presented to the ED 08/21/2022. Likely viral etiology, cannot completely exclude biliary component.  Mildly elevated T. Bilirubin level with normal Alk phos, AST/ALT levels. RUQ sonogram without evidence of gallstones or CBD dilatation. Elevated troponin level likely due to demand ischemia and not ACS per cardiology. -Patient to contact office if nausea/vomiting/diarrhea or chest pain recurs -Consider EGD and CCK HIDA scan if nausea/vomiting/chest pain symptoms recur -Patient to contact cardiology if chest pain recurs, to go to the ED if he has severe chest pain -Patient is scheduled to see his PCP in the next few weeks and plans on having routine laboratory studies done at that time, recommend hepatic panel at that time to check direct/indirect bilirubin levels  GERD, no current symptoms.  He was prescribed Pantoprazole 40 mg daily following his EGD evaluation 08/22/2022.  He stopped taking Pantoprazole 10 days ago. -Restart Pantoprazole if reflux symptoms occur  History of tubulovillous/tubular adenomatous/sessile serrated polyps.  His most recent colonoscopy  12/2018 identified 2 sessile serrated polyps removed from the hepatic flexure.  Father with history of colon cancer and brother with history of colon polyps. -Next colonoscopy due 12/2023  Nonobstructive coronary artery disease  Nonischemic apathy with LVEF 35 to 40%  Diabetes mellitus type 2   CC:  [name], MD

## 2022-10-01 NOTE — Progress Notes (Signed)
____________________________________________________________  Attending physician addendum:  Thank you for sending this case to me. I have reviewed the entire note and agree with the plan.   Oniel Meleski Danis, MD  ____________________________________________________________  

## 2022-10-01 NOTE — Patient Instructions (Addendum)
Follow up with your primary care physician regarding increased saliva production  At the time of your lab draw with your primary care physician, I recommend repeating a bilirubin level as your last level was a little elevated  If you have recurrence of esophageal/chest discomfort contact your cardiologist as well as our office and start Pantoprazole 40mg  once daily.  If you have significant chest pain go to the emergency room   Your next colonoscopy is due June 2025.  Thank you for trusting me with your gastrointestinal care!   Carl Best, CRNP

## 2022-10-06 ENCOUNTER — Other Ambulatory Visit: Payer: Self-pay | Admitting: *Deleted

## 2022-10-06 MED ORDER — SACUBITRIL-VALSARTAN 49-51 MG PO TABS: 1.5000 | ORAL_TABLET | Freq: Two times a day (BID) | ORAL | 11 refills | Status: DC

## 2022-10-06 NOTE — Telephone Encounter (Signed)
Pt called to FU with MD regarding this appeal.  Pt is asking for a call back to discuss. Pt would like to know if there are any developing updates?  Please return Pt's call at your earliest convenience.

## 2022-10-07 NOTE — Telephone Encounter (Signed)
Attempted to reach pt. Left a voicemail to call us back.  

## 2022-10-08 ENCOUNTER — Encounter: Payer: 59 | Admitting: Family Medicine

## 2022-10-08 ENCOUNTER — Ambulatory Visit: Payer: 59 | Admitting: Family Medicine

## 2022-10-09 NOTE — Progress Notes (Signed)
   10/09/2022  Patient ID: Robert Mcguire, male   DOB: 1960/12/03, 62 y.o.   MRN: [id number]  Contact made with Mr. Zahnow via telephone call in response to MyChart message he sent me to discuss Jardiance coverage.  Patient was told by Holland Falling the appeal had been approved for 1 year, but Dr. Lockie Pares office was informed this had been denied.  I contacted the filling pharmacy, and the prescription is ready for pickup at a $10 copay on insurance.  I am not sure why the variance in information provided to the patient and Dr. Volanda Napoleon; but I confirmed with Mr. Talkington that the prescription is covered and ready for him.  I will also forward encounter to Dr. Volanda Napoleon and CMA to make them aware.  Darlina Guys, PharmD, DPLA

## 2022-10-14 ENCOUNTER — Other Ambulatory Visit: Payer: Self-pay | Admitting: Family Medicine

## 2022-10-14 DIAGNOSIS — E782 Mixed hyperlipidemia: Secondary | ICD-10-CM

## 2022-10-15 ENCOUNTER — Encounter (INDEPENDENT_AMBULATORY_CARE_PROVIDER_SITE_OTHER): Payer: 59 | Admitting: Cardiology

## 2022-10-15 ENCOUNTER — Other Ambulatory Visit
Admission: RE | Admit: 2022-10-15 | Discharge: 2022-10-15 | Disposition: A | Discharge status: Home / Self Care | Payer: 59 | Source: Ambulatory Visit | Attending: Cardiology | Admitting: Cardiology

## 2022-10-15 ENCOUNTER — Encounter: Payer: Self-pay | Admitting: Cardiology

## 2022-10-15 ENCOUNTER — Ambulatory Visit (HOSPITAL_BASED_OUTPATIENT_CLINIC_OR_DEPARTMENT_OTHER): Payer: 59 | Admitting: Cardiology

## 2022-10-15 VITALS — BP 124/76 | HR 56 | Wt 222.4 lb

## 2022-10-15 DIAGNOSIS — I5022 Chronic systolic (congestive) heart failure: Secondary | ICD-10-CM | POA: Diagnosis not present

## 2022-10-15 DIAGNOSIS — G4719 Other hypersomnia: Secondary | ICD-10-CM

## 2022-10-15 DIAGNOSIS — G4710 Hypersomnia, unspecified: Secondary | ICD-10-CM

## 2022-10-15 LAB — BASIC METABOLIC PANEL
Anion gap: 5 (ref 5–15)
BUN: 24 mg/dL — ABNORMAL HIGH (ref 8–23)
CO2: 28 mmol/L (ref 22–32)
Calcium: 9.7 mg/dL (ref 8.9–10.3)
Chloride: 105 mmol/L (ref 98–111)
Creatinine, Ser: 0.92 mg/dL (ref 0.61–1.24)
GFR, Estimated: >60 mL/min (ref >60)
Glucose, Bld: 107 mg/dL — ABNORMAL HIGH (ref 70–99)
Potassium: 4.7 mmol/L (ref 3.5–5.1)
Sodium: 138 mmol/L (ref 135–145)

## 2022-10-15 LAB — BRAIN NATRIURETIC PEPTIDE: B Natriuretic Peptide: 126.4 pg/mL — ABNORMAL HIGH (ref 0.0–100.0)

## 2022-10-15 MED ORDER — ENTRESTO 97-103 MG PO TABS: 1.0000 | ORAL_TABLET | Freq: Two times a day (BID) | ORAL | 6 refills | Status: DC

## 2022-10-15 NOTE — Progress Notes (Unsigned)
ITAMAR home sleep study given to patient, all instructions explained, waiver signed, and CLOUDPAT registration complete.  

## 2022-10-15 NOTE — Patient Instructions (Addendum)
INCREASE Entresto to 97/103mg  twice daily  Your provider requests you have a cardiac MRI  Your provider requests you have a home sleep study  Routine lab work today. Will notify you of abnormal results  Repeat labs in 10 days  Follow up with Dr.McLean in 2 months we will contact you to schedule that appointment  Do the following things EVERYDAY: Weigh yourself in the morning before breakfast. Write it down and keep it in a log. Take your medicines as prescribed Eat low salt foods--Limit salt (sodium) to 2000 mg per day.  Stay as active as you can everyday Limit all fluids for the day to less than 2 liters

## 2022-10-15 NOTE — Progress Notes (Unsigned)
Height:  5'7"    Weight: 222 lb BMI: 34  Today's Date: 10/15/22  STOP BANG RISK ASSESSMENT S (snore) Have you been told that you snore?     NO   T (tired) Are you often tired, fatigued, or sleepy during the day?   YES  O (obstruction) Do you stop breathing, choke, or gasp during sleep? YES   P (pressure) Do you have or are you being treated for high blood pressure? YES   B (BMI) Is your body index greater than 35 kg/m? NO   A (age) Are you 62 years old or older? YES   N (neck) Do you have a neck circumference greater than 16 inches?   NO   G (gender) Are you a male? YES   TOTAL STOP/BANG "YES" ANSWERS 5                                                                       For Office Use Only              Procedure Order Form    YES to 3+ Stop Bang questions OR two clinical symptoms - patient qualifies for WatchPAT (CPT 95800)      Clinical Notes: Will consult Sleep Specialist and refer for management of therapy due to patient increased risk of Sleep Apnea. Ordering a sleep study due to the following two clinical symptoms: Excessive daytime sleepiness G47.10 /History of high blood pressure R03.0

## 2022-10-16 ENCOUNTER — Ambulatory Visit: Payer: 59 | Attending: Cardiology

## 2022-10-16 DIAGNOSIS — I5022 Chronic systolic (congestive) heart failure: Secondary | ICD-10-CM

## 2022-10-16 NOTE — Progress Notes (Signed)
PCP: Deeann Saint, MD Cardiology: Dr. Mariah Milling HF Cardiology: Dr. Shirlee Latch  62 y.o. with history of DM2, HTN, and chronic systolic CHF/nonischemic cardiomyopathy was referred by Dr. Mariah Milling for evaluation of CHF.  Patient noted bradycardia on his Apple Watch last fall.  No lightheadedness/syncope.  His metoprolol (on for HTN) was decreased and he was referred for an echo in 11/23, which showed EF 35-40%, mild LV dilation, normal RV.  Subsequent coronary CTA showed minimal CAD.  He was admitted in 2/24 to [facility] with presyncopal event that was thought to be vagally-mediated.  He has had no episodes of syncope/presyncope since that time.   Patient has been doing well in general.  No palpitations/lightheadedness.  HR in 50s today.  He walks daily and goes to the gym 3 days/week.  No chest pain or exertional dyspnea.  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  Nonsmoker, rare ETOH. Patient snores and has some daytime sleepiness.   ECG (personally reviewed): NSR, LAFB, LVH  Labs (3/24): K 4.3, creatinine 0.89  PMH: 1. Type 2 diabetes 2. HTN 3. Nephrolithiasis 4. H/o bradycardia 5. Chronic systolic CHF: Nonischemic cardiomyopathy. Coronary CTA in 12/23 showed minimal CAD (calcium score 3.42, 29th percentile).  Echo (11/23) with EF 35-40%, mild LV dilation, normal RV.   FH: Father with CHF in 61s, mother with PPM.  No sudden death.   Social History   Socioeconomic History   Marital status: Married    Spouse name: Not on file   Number of children: 2   Years of education: Not on file   Highest education level: Not on file  Occupational History   Occupation: Retired supply Scientist, product/process development  Tobacco Use   Smoking status: Never   Smokeless tobacco: Never  Vaping Use   Vaping Use: Never used  Substance and Sexual Activity   Alcohol use: Yes    Alcohol/week: 0.0 standard drinks of alcohol    Comment: occasionally   Drug use: No   Sexual activity: Not on file  Other Topics Concern    Not on file  Social History Narrative   Not on file   Social Determinants of Health   Financial Resource Strain: Not on file  Food Insecurity: Not on file  Transportation Needs: Not on file  Physical Activity: Not on file  Stress: Not on file  Social Connections: Not on file  Intimate Partner Violence: Not on file   ROS: All systems reviewed and negative except as per HPI.   Current Outpatient Medications  Medication Sig Dispense Refill   alfuzosin (UROXATRAL) 10 MG 24 hr tablet Take 10 mg by mouth daily.     aspirin EC 81 MG tablet Take 81 mg by mouth daily.     atorvastatin (LIPITOR) 40 MG tablet TAKE 1 TABLET BY MOUTH EVERY DAY 90 tablet 0   betamethasone dipropionate 0.05 % lotion Apply 1 Application topically 2 (two) times daily. (Apply thin layer to scalp and face)     cholecalciferol (VITAMIN D3) 25 MCG (1000 UNIT) tablet Take 1,000 Units by mouth daily.     Elderberry 500 MG CAPS Take 1 capsule by mouth daily.     empagliflozin (JARDIANCE) 25 MG TABS tablet Take 1 tablet (25 mg total) by mouth daily. 90 tablet 3   ketoconazole (NIZORAL) 2 % shampoo Apply 1 Application topically 3 (three) times a week.     latanoprost (XALATAN) 0.005 % ophthalmic solution Place 1 drop into both eyes every evening.  metoprolol succinate (TOPROL-XL) 25 MG 24 hr tablet Take 1 tablet (25 mg total) by mouth daily. Take with or immediately following a meal. 90 tablet 3   Multiple Vitamin (MULTIVITAMIN ADULT PO) Take 1 tablet by mouth daily.     pantoprazole (PROTONIX) 20 MG tablet Take 1 tablet (20 mg total) by mouth daily. (Patient taking differently: Take 20 mg by mouth as needed for heartburn.) 30 tablet 3   sacubitril-valsartan (ENTRESTO) 97-103 MG Take 1 tablet by mouth 2 (two) times daily. 60 tablet 6   sildenafil (VIAGRA) 50 MG tablet Take 50 mg by mouth as needed for erectile dysfunction.     spironolactone (ALDACTONE) 25 MG tablet Take 1 tablet (25 mg total) by mouth daily. 90 tablet 3    tadalafil (CIALIS) 5 MG tablet Take 5 mg by mouth daily.     timolol (TIMOPTIC) 0.5 % ophthalmic solution Place 1 drop into both eyes daily.     No current facility-administered medications for this visit.   BP 124/76   Pulse (!) 56   Wt 222 lb 6.4 oz (100.9 kg)   SpO2 100%   BMI 34.83 kg/m  General: NAD Neck: No JVD, no thyromegaly or thyroid nodule.  Lungs: Clear to auscultation bilaterally with normal respiratory effort. CV: Nondisplaced PMI.  Heart regular S1/S2, no S3/S4, no murmur.  No peripheral edema.  No carotid bruit.  Normal pedal pulses.  Abdomen: Soft, nontender, no hepatosplenomegaly, no distention.  Skin: Intact without lesions or rashes.  Neurologic: Alert and oriented x 3.  Psych: Normal affect. Extremities: No clubbing or cyanosis.  HEENT: Normal.   Assessment/Plan: 1. Chronic systolic CHF:  Nonischemic cardiomyopathy.  Coronary CTA in 12/23 showed minimal CAD (calcium score 3.42, 29th percentile).  Echo (11/23) with EF 35-40%, mild LV dilation, normal RV.  Cause of cardiomyopathy is uncertain.  He does not have a strong family history.  No h/o drug or ETOH abuse.  HTN could play a role though BP has been controlled.  - I will arrange for cardiac MRI to assess for infiltrative disease/prior myocarditis. - Increase Entresto to 97/103 bid. BMET/BNP today, BMET in 10 days.  - Continue Jardiance - Continue spironolactone 25 daily.  - Continue Toprol XL 25 mg daily.  I will not increase with bradycardia.  2. Sinus bradycardia: HR in 50s currently.  He had a presyncopal episode with HR in 30s-40s in 2/24, thought to be vagal.  No further lightheadedness.   - Would continue current Toprol XL but will not increase dose.  3.  OSA: Patient snores and has daytime sleepiness.  - Will arrange for sleep study.   Marca Ancona 10/16/2022

## 2022-10-16 NOTE — Procedures (Signed)
   SLEEP STUDY REPORT Patient Information Study Date: 10/15/2022 Patient Name: Robert Mcguire Patient ID: [id number] Birth Date: May 05, 1961 Age: 62 Gender: Male BMI: 34.9 (W=222 lb, H=5' 7'') Stopbang: 5 Referring Physician: Marca Ancona, MD  TEST DESCRIPTION: Home sleep apnea testing was completed using the WatchPat, a Type 1 device, utilizing peripheral arterial tonometry (PAT), chest movement, actigraphy, pulse oximetry, pulse rate, body position and snore. AHI was calculated with apnea and hypopnea using valid sleep time as the denominator. RDI includes apneas, hypopneas, and RERAs. The data acquired and the scoring of sleep and all associated events were performed in accordance with the recommended standards and specifications as outlined in the AASM Manual for the Scoring of Sleep and Associated Events 2.2.0 (2015).   FINDINGS: 1.  No evidence of Obstructive Sleep Apnea with AHI 2.1/hr.  2.  No Central Sleep Apnea. 3.  Oxygen desaturations as low as 81%. 4.  Minimal snoring was present. O2 sats were < 88% for 0 minutes. 5.  Total sleep time was 6 hrs and 31 min. 6.  37.3% of total sleep time was spent in REM sleep.  7.  Normal sleep onset latency at 17 min.  8.  Shortened REM sleep onset latency at 45 min.  9.  Total awakenings were 3.   DIAGNOSIS:  Normal study with no significant sleep disordered breathing.  RECOMMENDATIONS:   1. Normal study with no significant sleep disordered breathing.  2.  Healthy sleep recommendations include:  adequate nightly sleep (normal 7-9 hrs/night), avoidance of caffeine after noon and alcohol near bedtime, and maintaining a sleep environment that is cool, dark and quiet.  3.  Weight loss for overweight patients is recommended.    4.  Snoring recommendations include:  weight loss where appropriate, side sleeping, and avoidance of alcohol before bed.  5.  Operation of motor vehicle or dangerous equipment must be avoided when feeling drowsy,  excessively sleepy, or mentally fatigued.    6.  An ENT consultation which may be useful for specific causes of and possible treatment of bothersome snoring.   7. Weight loss may be of benefit in reducing the severity of snoring.   Signature: Armanda Magic, MD; [facility]; Diplomat, American Board of Sleep Medicine Electronically Signed: 10/16/2022 7:10:15 PM

## 2022-10-17 ENCOUNTER — Telehealth: Payer: Self-pay | Admitting: *Deleted

## 2022-10-17 ENCOUNTER — Encounter: Payer: Self-pay | Admitting: Family Medicine

## 2022-10-17 ENCOUNTER — Ambulatory Visit (INDEPENDENT_AMBULATORY_CARE_PROVIDER_SITE_OTHER): Payer: 59 | Admitting: Family Medicine

## 2022-10-17 VITALS — BP 126/70 | HR 53 | Temp 98.3°F | Ht 68.5 in | Wt 221.6 lb

## 2022-10-17 DIAGNOSIS — I10 Essential (primary) hypertension: Secondary | ICD-10-CM | POA: Diagnosis not present

## 2022-10-17 DIAGNOSIS — N529 Male erectile dysfunction, unspecified: Secondary | ICD-10-CM

## 2022-10-17 DIAGNOSIS — E1169 Type 2 diabetes mellitus with other specified complication: Secondary | ICD-10-CM | POA: Diagnosis not present

## 2022-10-17 DIAGNOSIS — Z125 Encounter for screening for malignant neoplasm of prostate: Secondary | ICD-10-CM | POA: Diagnosis not present

## 2022-10-17 DIAGNOSIS — I5022 Chronic systolic (congestive) heart failure: Secondary | ICD-10-CM

## 2022-10-17 DIAGNOSIS — Z7984 Long term (current) use of oral hypoglycemic drugs: Secondary | ICD-10-CM | POA: Diagnosis not present

## 2022-10-17 DIAGNOSIS — Z0000 Encounter for general adult medical examination without abnormal findings: Secondary | ICD-10-CM | POA: Diagnosis not present

## 2022-10-17 DIAGNOSIS — E782 Mixed hyperlipidemia: Secondary | ICD-10-CM | POA: Diagnosis not present

## 2022-10-17 LAB — MICROALBUMIN / CREATININE URINE RATIO
Creatinine,U: 136.1 mg/dL
Microalb Creat Ratio: 20.8 mg/g (ref 0.0–30.0)
Microalb, Ur: 28.4 mg/dL — ABNORMAL HIGH (ref 0.0–1.9)

## 2022-10-17 MED ORDER — SILDENAFIL CITRATE 50 MG PO TABS: 50.0000 mg | ORAL_TABLET | ORAL | 11 refills | Status: DC | PRN

## 2022-10-17 NOTE — Telephone Encounter (Signed)
-----   Message from Robert Mcguire, [place] sent at 10/17/2022  3:03 PM EDT -----  ----- Message ----- From: Quintella Reichert, MD Sent: 10/16/2022   7:11 PM EDT To: [facility] Sleep Studies  Please let patient know that sleep study showed no significant sleep apnea.

## 2022-10-17 NOTE — Progress Notes (Signed)
Established Patient Office Visit   Subjective  Patient ID: Robert Mcguire, male    DOB: 1961-04-03  Age: 62 y.o. MRN: [id number]  Chief Complaint  Patient presents with   Annual Exam    Patient is a 62 year old male seen for CPE.  Patient states he is doing well since last OFV.  Had several cardiology follow-up visits.  Had sleep study which did not show OSA.  Scheduled for cardiac MRI.  Entresto increased.  Denies SOB, LE edema, CP.  Patient continuing to make lifestyle modifications.  Walking regularly for exercise.  Blood sugar typically below 100 in AM.  Had an AM reading of 107 recently.  Followed by healthy weight.  Was finally able to get insurance to approve Jardiance.    Patient Active Problem List   Diagnosis Date Noted   Coronary artery disease 09/16/2022   Elevated troponin 08/21/2022   NICM (nonischemic cardiomyopathy) 08/21/2022   Gastroenteritis 08/21/2022   Nausea and vomiting 08/21/2022   Diarrhea 08/21/2022   Vasovagal episode 08/21/2022   BMI 34.0-34.9,adult 08/19/2022   Obesity, Beginning BMI 36.34 08/19/2022   Bradycardia 04/08/2022   Elevated liver enzymes 01/30/2022   Other fatigue 01/28/2022   SOB (shortness of breath) on exertion 01/28/2022   Diabetes mellitus 01/28/2022   Mixed hyperlipidemia 01/28/2022   Health care maintenance 01/28/2022   Vitamin D deficiency 01/28/2022   Class 2 severe obesity with serious comorbidity and body mass index (BMI) of 36.0 to 36.9 in adult 01/28/2022   Type 2 diabetes mellitus without complication, without long-term current use of insulin 08/20/2016   Hx of colonic polyps 09/30/2011   GYNECOMASTIA 01/15/2010   NEPHROLITHIASIS, HX OF 05/04/2008   SHOULDER PAIN, LEFT 11/08/2007   Obesity 01/22/2007   ERECTILE DYSFUNCTION 01/22/2007   Essential hypertension 01/22/2007   ALLERGIC RHINITIS, SEASONAL 01/22/2007   GERD 01/22/2007   HYPOKALEMIA, HX OF 01/22/2007   Social History   Tobacco Use   Smoking status: Never    Smokeless tobacco: Never  Vaping Use   Vaping Use: Never used  Substance Use Topics   Alcohol use: Yes    Alcohol/week: 0.0 standard drinks of alcohol    Comment: occasionally   Drug use: No   Family History  Problem Relation Age of Onset   Hypertension Mother    Heart disease Mother        Pacemaker   Obesity Father    Diabetes Father    Hypertension Father    Colon cancer Father 32   Colon polyps Father    High Cholesterol Father    Cancer Father    Healthy Sister    Hypertension Brother    Colon polyps Brother    Hypertension Brother    Hypertension Maternal Grandmother    Diabetes Other    Hypertension Other    Breast cancer Other        breast, colon    Stomach cancer Other        paternal great grand father   Esophageal cancer Neg Hx    Rectal cancer Neg Hx    Liver disease Neg Hx    No Known Allergies    ROS Negative unless stated above    Objective:     BP 126/70   Pulse (!) 53   Temp 98.3 F (36.8 C) (Oral)   Ht 5' 8.5" (1.74 m)   Wt 221 lb 9.6 oz (100.5 kg)   SpO2 98%   BMI 33.20 kg/m  BP Readings  from Last 3 Encounters:  10/17/22 126/70  10/15/22 124/76  10/01/22 130/80   Wt Readings from Last 3 Encounters:  10/17/22 221 lb 9.6 oz (100.5 kg)  10/15/22 222 lb 6.4 oz (100.9 kg)  10/01/22 219 lb (99.3 kg)      Physical Exam Constitutional:      Appearance: Normal appearance.  HENT:     Head: Normocephalic and atraumatic.     Right Ear: Tympanic membrane, ear canal and external ear normal.     Left Ear: Tympanic membrane, ear canal and external ear normal.     Nose: Nose normal.     Mouth/Throat:     Mouth: Mucous membranes are moist.     Pharynx: No oropharyngeal exudate or posterior oropharyngeal erythema.  Eyes:     General: No scleral icterus.    Extraocular Movements: Extraocular movements intact.     Conjunctiva/sclera: Conjunctivae normal.     Pupils: Pupils are equal, round, and reactive to light.  Neck:     Thyroid:  No thyromegaly.  Cardiovascular:     Rate and Rhythm: Normal rate and regular rhythm.     Pulses: Normal pulses.     Heart sounds: Normal heart sounds. No murmur heard.    No friction rub.  Pulmonary:     Effort: Pulmonary effort is normal.     Breath sounds: Normal breath sounds. No wheezing, rhonchi or rales.  Abdominal:     General: Bowel sounds are normal.     Palpations: Abdomen is soft.     Tenderness: There is no abdominal tenderness.  Musculoskeletal:        General: No deformity. Normal range of motion.  Lymphadenopathy:     Cervical: No cervical adenopathy.  Skin:    General: Skin is warm and dry.     Findings: No lesion.  Neurological:     General: No focal deficit present.     Mental Status: He is alert and oriented to person, place, and time.  Psychiatric:        Mood and Affect: Mood normal.        Thought Content: Thought content normal.      No results found for any visits on 10/17/22.    Assessment & Plan:  Well adult exam Anticipatory guidance given including wearing seatbelts, smoke detectors in the home, increasing physical activity, increasing p.o. intake of water and vegetables. -Patient has repeat labs with cardiology next week.  Will see additional labs such as PSA, lipid panel, hemoglobin A1c can be drawn at the same time.  Orders placed in the system. -Colonoscopy up-to-date done 12/23/2018 -Immunizations reviewed -Neck CPE in 1 year  Chronic systolic heart failure -Euvolemic -Continue current medications including Entresto 97-103 mg twice daily, spironolactone 25 mg daily -Continue lifestyle modifications -Continue follow-up with cardiology  Erectile dysfunction, unspecified erectile dysfunction type -Patient requesting refill on sildenafil. -Tadalafil also noted on med list and removed. -     Sildenafil Citrate; Take 1 tablet (50 mg total) by mouth as needed for erectile dysfunction.  Dispense: 10 tablet; Refill: 11  Type 2 diabetes  mellitus with other specified complication, without long-term current use of insulin -Hemoglobin A1c 5.3% on 01/29/2022 -Continue Jardiance 25 mg daily -Continue sacubitril-valsartan 97-103 mg twice daily and statin, Lipitor 40 mg daily -     Microalbumin / creatinine urine ratio -     Hemoglobin A1c; Future  Essential hypertension -Elevated -Recheck -Continue current medications including Toprol-XL 25 mg daily, spironolactone 25 mg  daily Entresto 97-103 mg twice daily -Continue lifestyle modifications  Screening for prostate cancer -     PSA; Future  Mixed hyperlipidemia -Continue Lipitor 40 mg daily -Continue lifestyle modifications -     Lipid panel; Future   No follow-ups on file.   Deeann Saint, MD

## 2022-10-17 NOTE — Telephone Encounter (Signed)
The patient has been notified of the result and verbalized understanding.  All questions (if any) were answered. Latrelle Dodrill, CMA 10/17/2022 3:19 PM    Pt is aware and agreeable to normal results.

## 2022-10-21 ENCOUNTER — Ambulatory Visit (INDEPENDENT_AMBULATORY_CARE_PROVIDER_SITE_OTHER): Payer: 59 | Admitting: Family Medicine

## 2022-10-21 ENCOUNTER — Encounter (INDEPENDENT_AMBULATORY_CARE_PROVIDER_SITE_OTHER): Payer: Self-pay | Admitting: Family Medicine

## 2022-10-21 ENCOUNTER — Telehealth (INDEPENDENT_AMBULATORY_CARE_PROVIDER_SITE_OTHER): Payer: 59 | Admitting: Family Medicine

## 2022-10-21 VITALS — BP 139/65 | HR 63 | Temp 97.5°F | Ht 67.0 in | Wt 216.0 lb

## 2022-10-21 DIAGNOSIS — I10 Essential (primary) hypertension: Secondary | ICD-10-CM | POA: Diagnosis not present

## 2022-10-21 DIAGNOSIS — Z6833 Body mass index (BMI) 33.0-33.9, adult: Secondary | ICD-10-CM | POA: Diagnosis not present

## 2022-10-21 DIAGNOSIS — I2510 Atherosclerotic heart disease of native coronary artery without angina pectoris: Secondary | ICD-10-CM | POA: Diagnosis not present

## 2022-10-21 DIAGNOSIS — E669 Obesity, unspecified: Secondary | ICD-10-CM | POA: Diagnosis not present

## 2022-10-22 NOTE — Progress Notes (Unsigned)
Chief Complaint:   OBESITY Galo is here to discuss his progress with his obesity treatment plan along with follow-up of his obesity related diagnoses. Bethel is on the Category 3 Plan and states he is following his eating plan approximately 60% of the time. Rykin states he is walking for 45 minutes 5 times per week.  Today's visit was #: 10 Starting weight: 232 lbs Starting date: 01/28/2022 Today's weight: 216 lbs Today's date: 10/21/2022 Total lbs lost to date: 16 Total lbs lost since last in-office visit: 0  Interim History: Ronn just got back from a cruise.  He did some celebration eating, but he tried to increase his protein and increase his activity.  He is already getting back on track with his eating plan.  Subjective:   1. Coronary artery disease, unspecified vessel or lesion type, unspecified whether angina present, unspecified whether native or transplanted heart Cezar has been seeing cardiology and he had his Entresto increased.  He has tried to increase walking and taking up golfing again.  He has also increased strengthening exercise.  2. Essential hypertension Seven's blood pressure is borderline elevated.  He has been checking his blood pressure at home and generally runs 120-130/80 but about 10% of the time his systolic blood pressure is above 140.  Assessment/Plan:   1. Coronary artery disease, unspecified vessel or lesion type, unspecified whether angina present, unspecified whether native or transplanted heart Keeven was encouraged to continue his exercise and he was instructed to check his blood pressure at home.  2. Essential hypertension Braedan was educated about blood pressure goals below 140/90 and weight loss as well and has decreasing sodium.  We will continue to monitor and adjust nutrition to meet his needs.  3. BMI 33.0-33.9,adult  4. Obesity, Beginning BMI 36.34 Orlin is currently in the action stage of change. As such, his goal is to continue with weight loss  efforts. He has agreed to the Category 3 Plan.   We discussed modifying his plan to be below 2000 mg of sodium per day.  Exercise goals: As is.   Behavioral modification strategies: increasing lean protein intake and decreasing sodium intake.  Tyris has agreed to follow-up with our clinic in 3 to 4 weeks. He was informed of the importance of frequent follow-up visits to maximize his success with intensive lifestyle modifications for his multiple health conditions.   Objective:   Blood pressure 139/65, pulse 63, temperature (!) 97.5 F (36.4 C), height  (1.702 m), weight 216 lb (98 kg), SpO2 97 %. Body mass index is 33.83 kg/m.  Lab Results  Component Value Date   CREATININE 0.92 10/15/2022   BUN 24 (H) 10/15/2022   NA 138 10/15/2022   K 4.7 10/15/2022   CL 105 10/15/2022   CO2 28 10/15/2022   Lab Results  Component Value Date   ALT 20 08/21/2022   AST 24 08/21/2022   ALKPHOS 50 08/21/2022   BILITOT 1.6 (H) 08/21/2022   Lab Results  Component Value Date   HGBA1C 5.3 01/29/2022   HGBA1C 5.6 09/30/2021   HGBA1C 5.5 03/15/2021   HGBA1C 5.4 09/20/2020   HGBA1C 5.4 06/08/2020   Lab Results  Component Value Date   INSULIN 10.3 01/29/2022   Lab Results  Component Value Date   TSH 1.810 01/29/2022   Lab Results  Component Value Date   CHOL 127 01/29/2022   HDL 44 01/29/2022   LDLCALC 70 01/29/2022   LDLDIRECT 168.0 05/13/2016  TRIG 63 01/29/2022   CHOLHDL 3 09/30/2021   Lab Results  Component Value Date   VD25OH 35.6 01/29/2022   VD25OH 33.70 09/20/2020   Lab Results  Component Value Date   WBC 5.0 08/22/2022   HGB 15.2 08/22/2022   HCT 44.2 08/22/2022   MCV 89.8 08/22/2022   PLT 154 08/22/2022   No results found for: "IRON", "TIBC", "FERRITIN"  Attestation Statements:   Reviewed by clinician on day of visit: allergies, medications, problem list, medical history, surgical history, family history, social history, and previous encounter  notes.  Time spent on visit including pre-visit chart review and post-visit care and charting was 30 minutes.   I, Burt Knack, am acting as transcriptionist for Quillian Quince, MD.  I have reviewed the above documentation for accuracy and completeness, and I agree with the above. -  Quillian Quince, MD

## 2022-10-24 ENCOUNTER — Other Ambulatory Visit
Admission: RE | Admit: 2022-10-24 | Discharge: 2022-10-24 | Disposition: A | Discharge status: Home / Self Care | Payer: 59 | Source: Home / Self Care | Attending: Cardiology | Admitting: Cardiology

## 2022-10-24 ENCOUNTER — Other Ambulatory Visit
Admission: RE | Admit: 2022-10-24 | Discharge: 2022-10-24 | Disposition: A | Discharge status: Home / Self Care | Payer: 59 | Attending: Family Medicine | Admitting: Family Medicine

## 2022-10-24 DIAGNOSIS — E1169 Type 2 diabetes mellitus with other specified complication: Secondary | ICD-10-CM | POA: Diagnosis not present

## 2022-10-24 DIAGNOSIS — Z125 Encounter for screening for malignant neoplasm of prostate: Secondary | ICD-10-CM

## 2022-10-24 DIAGNOSIS — E782 Mixed hyperlipidemia: Secondary | ICD-10-CM | POA: Diagnosis not present

## 2022-10-24 DIAGNOSIS — I5022 Chronic systolic (congestive) heart failure: Secondary | ICD-10-CM

## 2022-10-24 LAB — BASIC METABOLIC PANEL
Anion gap: 7 (ref 5–15)
BUN: 24 mg/dL — ABNORMAL HIGH (ref 8–23)
CO2: 25 mmol/L (ref 22–32)
Calcium: 9.5 mg/dL (ref 8.9–10.3)
Chloride: 108 mmol/L (ref 98–111)
Creatinine, Ser: 0.85 mg/dL (ref 0.61–1.24)
GFR, Estimated: >60 mL/min (ref >60)
Glucose, Bld: 107 mg/dL — ABNORMAL HIGH (ref 70–99)
Potassium: 4.3 mmol/L (ref 3.5–5.1)
Sodium: 140 mmol/L (ref 135–145)

## 2022-10-24 LAB — PSA: Prostatic Specific Antigen: 1.79 ng/mL (ref 0.00–4.00)

## 2022-10-24 LAB — LIPID PANEL
Cholesterol: 144 mg/dL (ref 0–200)
HDL: 46 mg/dL (ref >40)
LDL Cholesterol: 88 mg/dL (ref 0–99)
Total CHOL/HDL Ratio: 3.1 RATIO
Triglycerides: 48 mg/dL (ref <150)
VLDL: 10 mg/dL (ref 0–40)

## 2022-10-24 LAB — HEMOGLOBIN A1C
Hgb A1c MFr Bld: 5.1 % (ref 4.8–5.6)
Mean Plasma Glucose: 99.67 mg/dL

## 2022-10-24 NOTE — Addendum Note (Signed)
Addended by: Lonell Face C on: 10/24/2022 08:08 AM   Modules accepted: Orders

## 2022-10-24 NOTE — Addendum Note (Signed)
Addended by: Mell Guia C on: 10/24/2022 08:08 AM   Modules accepted: Orders  

## 2022-12-04 ENCOUNTER — Ambulatory Visit (INDEPENDENT_AMBULATORY_CARE_PROVIDER_SITE_OTHER): Payer: 59 | Admitting: Family Medicine

## 2022-12-31 ENCOUNTER — Ambulatory Visit (INDEPENDENT_AMBULATORY_CARE_PROVIDER_SITE_OTHER): Payer: 59 | Admitting: Family Medicine

## 2023-01-03 ENCOUNTER — Other Ambulatory Visit: Payer: Self-pay | Admitting: Family Medicine

## 2023-01-03 DIAGNOSIS — E782 Mixed hyperlipidemia: Secondary | ICD-10-CM

## 2023-01-05 ENCOUNTER — Encounter (HOSPITAL_COMMUNITY): Payer: Self-pay

## 2023-01-06 ENCOUNTER — Telehealth (HOSPITAL_COMMUNITY): Payer: Self-pay | Admitting: *Deleted

## 2023-01-06 ENCOUNTER — Ambulatory Visit (INDEPENDENT_AMBULATORY_CARE_PROVIDER_SITE_OTHER): Payer: 59 | Admitting: Family Medicine

## 2023-01-06 ENCOUNTER — Encounter (INDEPENDENT_AMBULATORY_CARE_PROVIDER_SITE_OTHER): Payer: Self-pay | Admitting: Family Medicine

## 2023-01-06 VITALS — BP 137/73 | HR 61 | Temp 97.6°F | Ht 67.0 in | Wt 219.0 lb

## 2023-01-06 DIAGNOSIS — E669 Obesity, unspecified: Secondary | ICD-10-CM

## 2023-01-06 DIAGNOSIS — E1169 Type 2 diabetes mellitus with other specified complication: Secondary | ICD-10-CM

## 2023-01-06 DIAGNOSIS — R4689 Other symptoms and signs involving appearance and behavior: Secondary | ICD-10-CM

## 2023-01-06 DIAGNOSIS — Z7984 Long term (current) use of oral hypoglycemic drugs: Secondary | ICD-10-CM

## 2023-01-06 DIAGNOSIS — F3289 Other specified depressive episodes: Secondary | ICD-10-CM

## 2023-01-06 DIAGNOSIS — Z6834 Body mass index (BMI) 34.0-34.9, adult: Secondary | ICD-10-CM | POA: Diagnosis not present

## 2023-01-06 NOTE — Progress Notes (Signed)
Chief Complaint:   OBESITY Robert Mcguire is here to discuss his progress with his obesity treatment plan along with follow-up of his obesity related diagnoses. Robert Mcguire is on the Category 3 Plan and states he is following his eating plan approximately 50% of the time. Robert Mcguire states he is walking for 45 minutes 4 times per week.  Today's visit was #: 11 Starting weight: 232 lbs Starting date: 01/28/2022 Today's weight: 219 lbs Today's date: 01/06/2023 Total lbs lost to date: 13 Total lbs lost since last in-office visit: 0  Interim History: Patient's last visit was approximately 3 months ago.  He has been traveling a lot and helping with his mother's care.  He has been off his normal routine and he has been skipping some meals.  Subjective:   1. Type 2 diabetes mellitus with other specified complication, without long-term current use of insulin (HCC) Patient is on Jardiance and he is working on decreasing simple carbohydrates.  His glucose ranges between 108-170.  His recent A1c was 5.1.  2. Emotional Eating Behavior Patient notes increased cravings recently and this corresponds to increased stress.  Assessment/Plan:   1. Type 2 diabetes mellitus with other specified complication, without long-term current use of insulin [facility]) Patient will continue Jardiance, and he will continue to work on his diet.  2. Emotional Eating Behavior Emotional eating behavior strategies were discussed, and we will follow-up in 8 weeks to reassess.  3. BMI 34.0-34.9,adult  4. Obesity, Beginning BMI 36.34 Robert Mcguire is currently in the action stage of change. As such, his goal is to get back to weightloss efforts . He has agreed to the Category 3 Plan.   Exercise goals: As is.   Behavioral modification strategies: increasing lean protein intake, no skipping meals, and emotional eating strategies.  Robert Mcguire has agreed to follow-up with our clinic in 8 weeks. He was informed of the importance of frequent follow-up visits to  maximize his success with intensive lifestyle modifications for his multiple health conditions.   Objective:   Blood pressure 137/73, pulse 61, temperature 97.6 F (36.4 C), height 5\' 7"  (1.702 m), weight 219 lb (99.3 kg), SpO2 98 %. Body mass index is 34.3 kg/m.  Lab Results  Component Value Date   CREATININE 0.85 10/24/2022   BUN 24 (H) 10/24/2022   NA 140 10/24/2022   K 4.3 10/24/2022   CL 108 10/24/2022   CO2 25 10/24/2022   Lab Results  Component Value Date   ALT 20 08/21/2022   AST 24 08/21/2022   ALKPHOS 50 08/21/2022   BILITOT 1.6 (H) 08/21/2022   Lab Results  Component Value Date   HGBA1C 5.1 10/24/2022   HGBA1C 5.3 01/29/2022   HGBA1C 5.6 09/30/2021   HGBA1C 5.5 03/15/2021   HGBA1C 5.4 09/20/2020   Lab Results  Component Value Date   INSULIN 10.3 01/29/2022   Lab Results  Component Value Date   TSH 1.810 01/29/2022   Lab Results  Component Value Date   CHOL 144 10/24/2022   HDL 46 10/24/2022   LDLCALC 88 10/24/2022   LDLDIRECT 168.0 05/13/2016   TRIG 48 10/24/2022   CHOLHDL 3.1 10/24/2022   Lab Results  Component Value Date   VD25OH 35.6 01/29/2022   VD25OH 33.70 09/20/2020   Lab Results  Component Value Date   WBC 5.0 08/22/2022   HGB 15.2 08/22/2022   HCT 44.2 08/22/2022   MCV 89.8 08/22/2022   PLT 154 08/22/2022   No results found for: "IRON", "TIBC", "  FERRITIN"  Attestation Statements:   Reviewed by clinician on day of visit: allergies, medications, problem list, medical history, surgical history, family history, social history, and previous encounter notes.  Time spent on visit including pre-visit chart review and post-visit care and charting was 30 minutes.   I, Trixie Dredge, am acting as transcriptionist for Dennard Nip, MD.  I have reviewed the above documentation for accuracy and completeness, and I agree with the above. -  Dennard Nip, MD

## 2023-01-06 NOTE — Telephone Encounter (Addendum)
Attempted to call patient regarding upcoming cardiac MRI appointment. Left message on voicemail with name and callback number  Navid Lenzen RN Navigator Cardiac Imaging [facility] [phone] Office [phone] Cell  

## 2023-01-06 NOTE — Telephone Encounter (Signed)
Reaching out to patient to offer assistance regarding upcoming cardiac imaging study; pt verbalizes understanding of appt date/time, parking situation and where to check in, and verified current allergies; name and call back number provided for further questions should they arise ? ?Michel Eskelson RN Navigator Cardiac Imaging ?[facility] ?[phone] office ?[phone] cell ? ?Patient denies metal or claustrophobia. ?

## 2023-01-07 ENCOUNTER — Ambulatory Visit (HOSPITAL_COMMUNITY)
Admission: RE | Admit: 2023-01-07 | Discharge: 2023-01-07 | Disposition: A | Discharge status: Home / Self Care | Payer: 59 | Source: Ambulatory Visit | Attending: Cardiology | Admitting: Cardiology

## 2023-01-07 ENCOUNTER — Other Ambulatory Visit: Payer: Self-pay | Admitting: Cardiology

## 2023-01-07 DIAGNOSIS — I5022 Chronic systolic (congestive) heart failure: Secondary | ICD-10-CM

## 2023-01-07 MED ORDER — GADOBUTROL 1 MMOL/ML IV SOLN
10.0000 mL | Freq: Once | INTRAVENOUS | Status: AC | PRN
  Administered 2023-01-07: 10 mL via INTRAVENOUS

## 2023-01-09 ENCOUNTER — Telehealth (HOSPITAL_COMMUNITY): Payer: Self-pay

## 2023-01-09 ENCOUNTER — Encounter (HOSPITAL_COMMUNITY): Payer: Self-pay

## 2023-01-09 NOTE — Telephone Encounter (Signed)
Will need aggressive medication titration to begin with.

## 2023-01-09 NOTE — Telephone Encounter (Addendum)
Pt aware, agreeable, and verbalized understanding  Patient had a question and it has been routed to Dr. Shirlee Latch.    ----- Message from Laurey Morale, MD sent at 01/08/2023 12:45 PM EDT ----- EF 38% (moderately reduced).  Delayed enhancement pattern may be suggestive of prior myocarditis.

## 2023-01-09 NOTE — Telephone Encounter (Signed)
Mychart message sent to patient.

## 2023-02-07 DIAGNOSIS — R1111 Vomiting without nausea: Secondary | ICD-10-CM | POA: Diagnosis not present

## 2023-02-07 DIAGNOSIS — Z743 Need for continuous supervision: Secondary | ICD-10-CM | POA: Diagnosis not present

## 2023-02-07 DIAGNOSIS — F10929 Alcohol use, unspecified with intoxication, unspecified: Secondary | ICD-10-CM | POA: Diagnosis not present

## 2023-02-07 DIAGNOSIS — I10 Essential (primary) hypertension: Secondary | ICD-10-CM | POA: Diagnosis not present

## 2023-02-07 DIAGNOSIS — W19XXXA Unspecified fall, initial encounter: Secondary | ICD-10-CM | POA: Diagnosis not present

## 2023-02-07 DIAGNOSIS — F10129 Alcohol abuse with intoxication, unspecified: Secondary | ICD-10-CM | POA: Diagnosis not present

## 2023-02-16 ENCOUNTER — Other Ambulatory Visit: Payer: Self-pay | Admitting: Urology

## 2023-02-25 ENCOUNTER — Ambulatory Visit: Payer: 59 | Attending: Cardiology | Admitting: Cardiology

## 2023-02-25 ENCOUNTER — Other Ambulatory Visit (HOSPITAL_COMMUNITY): Payer: Self-pay

## 2023-02-25 VITALS — BP 118/70 | HR 57 | Wt 226.0 lb

## 2023-02-25 DIAGNOSIS — I5022 Chronic systolic (congestive) heart failure: Secondary | ICD-10-CM | POA: Diagnosis not present

## 2023-02-25 DIAGNOSIS — R001 Bradycardia, unspecified: Secondary | ICD-10-CM | POA: Diagnosis not present

## 2023-02-25 DIAGNOSIS — Z7984 Long term (current) use of oral hypoglycemic drugs: Secondary | ICD-10-CM | POA: Insufficient documentation

## 2023-02-25 DIAGNOSIS — R55 Syncope and collapse: Secondary | ICD-10-CM | POA: Insufficient documentation

## 2023-02-25 DIAGNOSIS — I110 Hypertensive heart disease with heart failure: Secondary | ICD-10-CM | POA: Insufficient documentation

## 2023-02-25 DIAGNOSIS — I428 Other cardiomyopathies: Secondary | ICD-10-CM | POA: Diagnosis not present

## 2023-02-25 DIAGNOSIS — I2510 Atherosclerotic heart disease of native coronary artery without angina pectoris: Secondary | ICD-10-CM | POA: Insufficient documentation

## 2023-02-25 DIAGNOSIS — Z8616 Personal history of COVID-19: Secondary | ICD-10-CM | POA: Insufficient documentation

## 2023-02-25 DIAGNOSIS — Z79899 Other long term (current) drug therapy: Secondary | ICD-10-CM | POA: Diagnosis not present

## 2023-02-25 MED ORDER — ISOSORB DINITRATE-HYDRALAZINE 20-37.5 MG PO TABS: 0.5000 | ORAL_TABLET | Freq: Three times a day (TID) | ORAL | 6 refills | Status: DC

## 2023-02-25 NOTE — Patient Instructions (Signed)
Medication Changes:  STOP Tadalafil and Sildenafil  START Bidil 1/2 tab Three times a day   Lab Work:  Labs done today, your results will be available in MyChart, we will contact you for abnormal readings.  Testing/Procedures:  Cardiac PET Scan has been ordered, you will be called to schedule this, please see instructions below, follow them as directed, and keep a food diary  Special Instructions // Education:  Do the following things EVERYDAY: Weigh yourself in the morning before breakfast. Write it down and keep it in a log. Take your medicines as prescribed Eat low salt foods--Limit salt (sodium) to 2000 mg per day.  Stay as active as you can everyday Limit all fluids for the day to less than 2 liters    Cardiac Sarcoidosis/Inflammation PET Scan Patient Instructions  Please report to Radiology at the [facility] Main Entrance 15 minutes early for your test.  [address] [place], [place] [address] BRING FOOD DIARY WITH YOU TO THIS APPOINTMENT For 24 hours before the test: Do not exercise! Do not eat after 5 pm the day before your test! To make sure that your scanning results are accurate, you MUST follow the sarcoid prep meal diet starting the day before your PET scan. This diet involves eating no carbohydrates 24 hours before the test.  You will keep a log of all that you eat the day before your test. If you have questions or do not understand this diet, please call [phone] for more information. If you are unable to follow this diet, please discuss an alternative strategy with the coordinator.  If you are diabetic, continue your diabetes medications as usual on the day before until you begin to fast. NO DIABETES MEDICATIONS ONCE YOU BEGIN TO FAST. What foods can I eat the day before my test?  Drink only water or black coffee (WITHOUT sugar, artificial sweetener, cream, or milk). Eggs (prepared without milk or cheese)  Meat that is either broiled or pan  fried in butter WITHOUT breading (chicken, [place], bacon, meat-only sausage, hamburger, steak, fish) Butter, salt & pepper What foods must I AVOID the day before my test?  Do not consume alcoholic beverages, sodas, fruit juice, coffee creamer, or sports drinks  Do not eat vegetables, beans, nuts, fruits, juices, bread, grains, rice, pasta, potatoes, or any baked goods Do not eat dairy products (milk, cheese, etc.)  Do not eat mayonnaise, ketchup, tartar sauce, mustard, or other condiments Do not add sugar, artificial sweeteners, or Splenda (sucralose) to foods or drinks  Do not eat breaded foods (like fried chicken)  Do not eat sweets, candy, gum, sweetened cough drops, lozenges, or sugar  Do not eat sweetened, grilled, or cured meats or meat with carbohydrate-containing additives (some sausages, ham, sweetened bacon) Suggested items for breakfast, lunch, or dinner:  Breakfast  3 to 5 fatty sausage links fried in butter. 3 to 5 bacon strips.  3 eggs pan fried in butter (no milk or cheese).  Lunch/Dinner  2 hamburger patties fried in butter. Chicken or fatty fish pan fried in butter. No breading. 8 oz. fatty steak pan fried in butter.  Beverages  Drink only water or black coffee. DO NOT ADD SUGAR, ARTIFICIAL SWEETENER, CREAM, OR MILK   For more information and frequently asked questions, please visit our website : [email or web address]  Cardiac Sarcoidosis/Inflammation PET Scan  Food Diary Name: _____________________________ Please fill in EXACTLY what you have eaten and when for 24 hours PRIOR to your test date.  Time Food/Drink Comments  Breakfast                Lunch                Dinner                Snacks                 DO NOT EXERCISE THE DAY BEFORE YOUR TEST DO NOT EAT AFTER 5 PM THE DAY BEFORE YOUR TEST.  ON THE DAY OF YOUR TEST, DO NOT EAT ANY FOOD AND ONLY DRINK CLEAR WATER! PLEASE BRING THIS FOOD DIARY WITH YOU TO YOUR  APPOINTMENT   Follow-Up in: 3 months    If you have any questions or concerns before your next appointment please send [place] a message through [place] or call our office at [phone] Monday-Friday 8 am-5 pm.   If you have an urgent need after hours on the weekend please call your Primary Cardiologist or the [facility] in [place] at [phone].

## 2023-02-25 NOTE — Progress Notes (Signed)
PCP: Deeann Saint, MD Cardiology: Dr. Mariah Milling HF Cardiology: Dr. Shirlee Latch  62 y.o. with history of DM2, HTN, and chronic systolic CHF/nonischemic cardiomyopathy was referred by Dr. Mariah Milling for evaluation of CHF.  Patient noted bradycardia on his Apple Watch last fall.  No lightheadedness/syncope.  His metoprolol (on for HTN) was decreased and he was referred for an echo in 11/23, which showed EF 35-40%, mild LV dilation, normal RV.  Subsequent coronary CTA showed minimal CAD.  He was admitted in 2/24 to [facility] with presyncopal event that was thought to be vagally-mediated.  He has had no episodes of syncope/presyncope since that time. He does not have a strong FH of CHF, father had "heart failure" along with other chronic illnesses when he was of advanced age.  No h/o CHF or cardiomyopathy among his siblings.    Cardiac MRI in 7/24 showed mild LV dilation with EF 30%, diffuse hypokinesis, RV EF 34%, mid-wall LGE in the basal to mid inferoseptal wall.    Patient has been doing well in general.  HR in 50s today.  No lightheadedness or palpitations.  Patient exercises with walking and golf, no exertional dyspnea.  He reports no exercise limitations.  No cough.  No chest pain.  No orthopnea/PND.    ECG (personally reviewed): Sinus brady, LAFB, 1st degree AVB, inferolateral TWIs  Labs (3/24): K 4.3, creatinine 0.89 Labs (4/24): K 4.3, creatinine 0.85  PMH: 1. Type 2 diabetes 2. HTN 3. Nephrolithiasis 4. H/o bradycardia 5. Chronic systolic CHF: Nonischemic cardiomyopathy. Coronary CTA in 12/23 showed minimal CAD (calcium score 3.42, 29th percentile).  Echo (11/23) with EF 35-40%, mild LV dilation, normal RV.  - Cardiac MRI (7/24): Mild LV dilation with EF 30%, diffuse hypokinesis, RV EF 34%, mid-wall LGE in the basal to mid inferoseptal wall.  6. Sleep study negative  FH: Father with CHF in 35s, mother with PPM.  No sudden death.  Siblings with no CHF/cardiomyopathy.   Social History    Socioeconomic History   Marital status: Married    Spouse name: Not on file   Number of children: 2   Years of education: Not on file   Highest education level: Not on file  Occupational History   Occupation: Retired supply Scientist, product/process development  Tobacco Use   Smoking status: Never   Smokeless tobacco: Never  Vaping Use   Vaping status: Never Used  Substance and Sexual Activity   Alcohol use: Yes    Alcohol/week: 0.0 standard drinks of alcohol    Comment: occasionally   Drug use: No   Sexual activity: Not on file  Other Topics Concern   Not on file  Social History Narrative   Not on file   Social Determinants of Health   Financial Resource Strain: Not on file  Food Insecurity: Not on file  Transportation Needs: Not on file  Physical Activity: Not on file  Stress: Not on file  Social Connections: Not on file  Intimate Partner Violence: Not on file   ROS: All systems reviewed and negative except as per HPI.   Current Outpatient Medications  Medication Sig Dispense Refill   alfuzosin (UROXATRAL) 10 MG 24 hr tablet Take 10 mg by mouth daily.     aspirin EC 81 MG tablet Take 81 mg by mouth daily.     atorvastatin (LIPITOR) 40 MG tablet TAKE 1 TABLET BY MOUTH EVERY DAY 90 tablet 2   betamethasone dipropionate 0.05 % lotion Apply 1 Application topically 2 (two) times daily. (Apply  thin layer to scalp and face)     cholecalciferol (VITAMIN D3) 25 MCG (1000 UNIT) tablet Take 1,000 Units by mouth daily.     Elderberry 500 MG CAPS Take 1 capsule by mouth daily.     empagliflozin (JARDIANCE) 25 MG TABS tablet Take 1 tablet (25 mg total) by mouth daily. 90 tablet 3   isosorbide-hydrALAZINE (BIDIL) 20-37.5 MG tablet Take 0.5 tablets by mouth 3 (three) times daily. 45 tablet 6   ketoconazole (NIZORAL) 2 % shampoo Apply 1 Application topically 3 (three) times a week.     latanoprost (XALATAN) 0.005 % ophthalmic solution Place 1 drop into both eyes every evening.     metoprolol succinate  (TOPROL-XL) 25 MG 24 hr tablet Take 1 tablet (25 mg total) by mouth daily. Take with or immediately following a meal. 90 tablet 3   Multiple Vitamin (MULTIVITAMIN ADULT PO) Take 1 tablet by mouth daily.     pantoprazole (PROTONIX) 20 MG tablet Take 20 mg by mouth daily as needed for heartburn.     sacubitril-valsartan (ENTRESTO) 97-103 MG Take 1 tablet by mouth 2 (two) times daily. 60 tablet 6   spironolactone (ALDACTONE) 25 MG tablet Take 1 tablet (25 mg total) by mouth daily. 90 tablet 3   timolol (TIMOPTIC) 0.5 % ophthalmic solution Place 1 drop into both eyes daily.     No current facility-administered medications for this visit.   BP 118/70   Pulse (!) 57   Wt 226 lb (102.5 kg)   SpO2 100%   BMI 35.40 kg/m  General: NAD Neck: No JVD, no thyromegaly or thyroid nodule.  Lungs: Clear to auscultation bilaterally with normal respiratory effort. CV: Nondisplaced PMI.  Heart regular S1/S2, no S3/S4, no murmur.  No peripheral edema.  No carotid bruit.  Normal pedal pulses.  Abdomen: Soft, nontender, no hepatosplenomegaly, no distention.  Skin: Intact without lesions or rashes.  Neurologic: Alert and oriented x 3.  Psych: Normal affect. Extremities: No clubbing or cyanosis.  HEENT: Normal.   Assessment/Plan: 1. Chronic systolic CHF:  Nonischemic cardiomyopathy.  Coronary CTA in 12/23 showed minimal CAD (calcium score 3.42, 29th percentile).  Echo (11/23) with EF 35-40%, mild LV dilation, normal RV.  Cause of cardiomyopathy is uncertain.  He does not have a strong family history.  No h/o drug or ETOH abuse.  HTN could play a role though BP has been controlled.  Cardiac MRI in 7/24 showed mild LV dilation with EF 30%, diffuse hypokinesis, RV EF 34%, mid-wall LGE in the basal to mid inferoseptal wall.  Possible prior viral myocarditis, cardiomyopathy was diagnosed after a COVID episode.  Mid-wall LGE also could be seen with cardiac sarcoidosis though CT chest in 12/23 showed no signs of pulmonary  sarcoidosis. NYHA class I, not volume overloaded on exam.  - Continue Entresto 97/103 bid. BMET/BNP today.  - Continue Jardiance - Continue spironolactone 25 daily.  - Continue Toprol XL 25 mg daily.  I will not increase with bradycardia.  - Add Bidil 1/2 tab tid. He has been taking tadalafil 5 mg daily chronically.  I spoke with his urologist and he will stop this for now.  2. Sinus bradycardia: HR in 50s currently.  He had a presyncopal episode with HR in 30s-40s in 2/24, thought to be vagal.  No further lightheadedness.   - Would continue current Toprol XL but will not increase dose.   Marca Ancona 02/25/2023

## 2023-02-26 DIAGNOSIS — H401132 Primary open-angle glaucoma, bilateral, moderate stage: Secondary | ICD-10-CM | POA: Diagnosis not present

## 2023-02-26 DIAGNOSIS — E119 Type 2 diabetes mellitus without complications: Secondary | ICD-10-CM | POA: Diagnosis not present

## 2023-02-26 DIAGNOSIS — H2513 Age-related nuclear cataract, bilateral: Secondary | ICD-10-CM | POA: Diagnosis not present

## 2023-02-26 LAB — BASIC METABOLIC PANEL
BUN/Creatinine Ratio: 27 — ABNORMAL HIGH (ref 10–24)
BUN: 24 mg/dL (ref 8–27)
CO2: 23 mmol/L (ref 20–29)
Calcium: 9.9 mg/dL (ref 8.6–10.2)
Chloride: 102 mmol/L (ref 96–106)
Creatinine, Ser: 0.88 mg/dL (ref 0.76–1.27)
Glucose: 100 mg/dL — ABNORMAL HIGH (ref 70–99)
Potassium: 4.6 mmol/L (ref 3.5–5.2)
Sodium: 138 mmol/L (ref 134–144)
eGFR: 97 mL/min/{1.73_m2} (ref >59)

## 2023-02-26 LAB — CBC
Hematocrit: 49.5 % (ref 37.5–51.0)
Hemoglobin: 16.6 g/dL (ref 13.0–17.7)
MCH: 30.6 pg (ref 26.6–33.0)
MCHC: 33.5 g/dL (ref 31.5–35.7)
MCV: 91 fL (ref 79–97)
Platelets: 182 10*3/uL (ref 150–450)
RBC: 5.43 x10E6/uL (ref 4.14–5.80)
RDW: 13.3 % (ref 11.6–15.4)
WBC: 3.7 10*3/uL (ref 3.4–10.8)

## 2023-02-26 LAB — ANGIOTENSIN CONVERTING ENZYME: Angio Convert Enzyme: 40 U/L (ref 14–82)

## 2023-02-26 LAB — BRAIN NATRIURETIC PEPTIDE: BNP: 67.4 pg/mL (ref 0.0–100.0)

## 2023-02-26 LAB — HM DIABETES EYE EXAM

## 2023-03-03 ENCOUNTER — Encounter (INDEPENDENT_AMBULATORY_CARE_PROVIDER_SITE_OTHER): Payer: Self-pay | Admitting: Family Medicine

## 2023-03-03 ENCOUNTER — Ambulatory Visit (INDEPENDENT_AMBULATORY_CARE_PROVIDER_SITE_OTHER): Payer: 59 | Admitting: Family Medicine

## 2023-03-03 VITALS — BP 127/76 | HR 59 | Temp 98.3°F | Ht 67.0 in | Wt 223.0 lb

## 2023-03-03 DIAGNOSIS — E669 Obesity, unspecified: Secondary | ICD-10-CM

## 2023-03-03 DIAGNOSIS — Z6834 Body mass index (BMI) 34.0-34.9, adult: Secondary | ICD-10-CM

## 2023-03-03 DIAGNOSIS — I10 Essential (primary) hypertension: Secondary | ICD-10-CM

## 2023-03-04 NOTE — Progress Notes (Signed)
Chief Complaint:   OBESITY Robert Mcguire is here to discuss his progress with his obesity treatment plan along with follow-up of his obesity related diagnoses. [name] is on the Category 3 Plan and states he is following his eating plan approximately 60% of the time. [name] states he is walking for 45 minutes 4 times per week.  Today's visit was #: 12 Starting weight: 232 lbs Starting date: 01/28/2022 Today's weight: 223 lbs Today's date: 03/03/2023 Total lbs lost to date: 9 Total lbs lost since last in-office visit: 0  Interim History: Patient has been more traveling and eating out more.  He is ready to work on getting back on track.  His hunger has been controlled for the most part.  Subjective:   1. Essential hypertension Patient is now on BiDil, and his blood pressure is controlled but he is having frontal and occipital headaches.   Assessment/Plan:   1. Essential hypertension Patient is to increase his water to at least 100 oz daily to help with headaches. He is ok to take Tylenol as needed. We will recheck his blood pressure in 1 month. He will continue with his diet, exercise, and weight loss.   2. BMI 34.0-34.9,adult  3. Obesity, Beginning BMI 36.34 Anterio is currently in the action stage of change. As such, his goal is to continue with weight loss efforts. He has agreed to the Category 3 Plan.   Exercise goals: As is.   Behavioral modification strategies: increasing lean protein intake.  Lyrik has agreed to follow-up with our clinic in 4 weeks. He was informed of the importance of frequent follow-up visits to maximize his success with intensive lifestyle modifications for his multiple health conditions.   Objective:   Blood pressure 127/76, pulse (!) 59, temperature 98.3 F (36.8 C), height 5\' 7"  (1.702 m), weight 223 lb (101.2 kg), SpO2 98%. Body mass index is 34.93 kg/m.  Lab Results  Component Value Date   CREATININE 0.88 02/25/2023   BUN 24 02/25/2023   NA 138 02/25/2023    K 4.6 02/25/2023   CL 102 02/25/2023   CO2 23 02/25/2023   Lab Results  Component Value Date   ALT 20 08/21/2022   AST 24 08/21/2022   ALKPHOS 50 08/21/2022   BILITOT 1.6 (H) 08/21/2022   Lab Results  Component Value Date   HGBA1C 5.1 10/24/2022   HGBA1C 5.3 01/29/2022   HGBA1C 5.6 09/30/2021   HGBA1C 5.5 03/15/2021   HGBA1C 5.4 09/20/2020   Lab Results  Component Value Date   INSULIN 10.3 01/29/2022   Lab Results  Component Value Date   TSH 1.810 01/29/2022   Lab Results  Component Value Date   CHOL 144 10/24/2022   HDL 46 10/24/2022   LDLCALC 88 10/24/2022   LDLDIRECT 168.0 05/13/2016   TRIG 48 10/24/2022   CHOLHDL 3.1 10/24/2022   Lab Results  Component Value Date   VD25OH 35.6 01/29/2022   VD25OH 33.70 09/20/2020   Lab Results  Component Value Date   WBC 3.7 02/25/2023   HGB 16.6 02/25/2023   HCT 49.5 02/25/2023   MCV 91 02/25/2023   PLT 182 02/25/2023   No results found for: "IRON", "TIBC", "FERRITIN"  Attestation Statements:   Reviewed by clinician on day of visit: allergies, medications, problem list, medical history, surgical history, family history, social history, and previous encounter notes.  Time spent on visit including pre-visit chart review and post-visit care and charting was 37 minutes.   Olean Ree  Daphine Deutscher, am acting as Energy manager for Quillian Quince, MD.  I have reviewed the above documentation for accuracy and completeness, and I agree with the above. -  Quillian Quince, MD

## 2023-04-02 ENCOUNTER — Encounter (INDEPENDENT_AMBULATORY_CARE_PROVIDER_SITE_OTHER): Payer: Self-pay | Admitting: Family Medicine

## 2023-04-02 ENCOUNTER — Ambulatory Visit (INDEPENDENT_AMBULATORY_CARE_PROVIDER_SITE_OTHER): Payer: 59 | Admitting: Family Medicine

## 2023-04-02 VITALS — BP 125/73 | HR 56 | Temp 97.9°F | Ht 67.0 in | Wt 221.0 lb

## 2023-04-02 DIAGNOSIS — Z7984 Long term (current) use of oral hypoglycemic drugs: Secondary | ICD-10-CM

## 2023-04-02 DIAGNOSIS — E1169 Type 2 diabetes mellitus with other specified complication: Secondary | ICD-10-CM | POA: Diagnosis not present

## 2023-04-02 DIAGNOSIS — E669 Obesity, unspecified: Secondary | ICD-10-CM

## 2023-04-02 DIAGNOSIS — Z6834 Body mass index (BMI) 34.0-34.9, adult: Secondary | ICD-10-CM

## 2023-04-02 NOTE — Progress Notes (Signed)
Chief Complaint:   OBESITY Robert Mcguire is here to discuss his progress with his obesity treatment plan along with follow-up of his obesity related diagnoses. Robert Mcguire is on the Category 3 Plan and states he is following his eating plan approximately 50% of the time. Robert Mcguire states he is walking for 45 minutes 4 times per week.  Today's visit was #: 13 Starting weight: 232 lbs Starting date: 01/28/2022 Today's weight: 221 lbs Today's date: 04/02/2023 Total lbs lost to date: 11 Total lbs lost since last in-office visit: 2  Interim History: Patient has done better with his weight loss.  He notes some increase in his hunger in the afternoons.  He eats dinner at 6-6:30 PM and he finds he is not following his Category 3 plan as closely.  Subjective:   1. Type 2 diabetes mellitus with other specified complication, without long-term current use of insulin (HCC) Patient is on Jardiance, and he is working on his diet.  He is not checking his blood sugars at home.  Assessment/Plan:   1. Type 2 diabetes mellitus with other specified complication, without long-term current use of insulin (HCC) Patient will continue Jardiance and will continue with his diet.  We discussed the importance of decreasing simple carbohydrates and increasing protein and vegetables to maintain a healthy glucose level.  2. BMI 34.0-34.9,adult  3. Obesity, Beginning BMI 36.34 Robert Mcguire is currently in the action stage of change. As such, his goal is to continue with weight loss efforts. He has agreed to the Category 3 Plan and keeping a food journal and adhering to recommended goals of 400-600 calories and 35+ grams of protein at lunch daily.   Exercise goals: As is.   Behavioral modification strategies: increasing lean protein intake.  Robert Mcguire has agreed to follow-up with our clinic in 4 weeks. He was informed of the importance of frequent follow-up visits to maximize his success with intensive lifestyle modifications for his multiple health  conditions.   Objective:   Blood pressure 125/73, pulse (!) 56, temperature 97.9 F (36.6 C), height 5\' 7"  (1.702 m), weight 221 lb (100.2 kg), SpO2 100%. Body mass index is 34.61 kg/m.  Lab Results  Component Value Date   CREATININE 0.88 02/25/2023   BUN 24 02/25/2023   NA 138 02/25/2023   K 4.6 02/25/2023   CL 102 02/25/2023   CO2 23 02/25/2023   Lab Results  Component Value Date   ALT 20 08/21/2022   AST 24 08/21/2022   ALKPHOS 50 08/21/2022   BILITOT 1.6 (H) 08/21/2022   Lab Results  Component Value Date   HGBA1C 5.1 10/24/2022   HGBA1C 5.3 01/29/2022   HGBA1C 5.6 09/30/2021   HGBA1C 5.5 03/15/2021   HGBA1C 5.4 09/20/2020   Lab Results  Component Value Date   INSULIN 10.3 01/29/2022   Lab Results  Component Value Date   TSH 1.810 01/29/2022   Lab Results  Component Value Date   CHOL 144 10/24/2022   HDL 46 10/24/2022   LDLCALC 88 10/24/2022   LDLDIRECT 168.0 05/13/2016   TRIG 48 10/24/2022   CHOLHDL 3.1 10/24/2022   Lab Results  Component Value Date   VD25OH 35.6 01/29/2022   VD25OH 33.70 09/20/2020   Lab Results  Component Value Date   WBC 3.7 02/25/2023   HGB 16.6 02/25/2023   HCT 49.5 02/25/2023   MCV 91 02/25/2023   PLT 182 02/25/2023   No results found for: "IRON", "TIBC", "FERRITIN"  Attestation Statements:   Reviewed  by clinician on day of visit: allergies, medications, problem list, medical history, surgical history, family history, social history, and previous encounter notes.  Time spent on visit including pre-visit chart review and post-visit care and charting was 30 minutes.   I, Burt Knack, am acting as transcriptionist for Quillian Quince, MD.  I have reviewed the above documentation for accuracy and completeness, and I agree with the above. -  Quillian Quince, MD

## 2023-04-06 ENCOUNTER — Encounter: Payer: Self-pay | Admitting: Cardiovascular Disease

## 2023-04-27 ENCOUNTER — Other Ambulatory Visit: Payer: Self-pay | Admitting: Cardiology

## 2023-04-27 ENCOUNTER — Telehealth (HOSPITAL_COMMUNITY): Payer: Self-pay | Admitting: *Deleted

## 2023-04-30 ENCOUNTER — Ambulatory Visit (INDEPENDENT_AMBULATORY_CARE_PROVIDER_SITE_OTHER): Payer: 59 | Admitting: Family Medicine

## 2023-05-01 ENCOUNTER — Encounter (HOSPITAL_COMMUNITY): Payer: Self-pay

## 2023-05-04 ENCOUNTER — Telehealth (HOSPITAL_COMMUNITY): Payer: Self-pay | Admitting: *Deleted

## 2023-05-04 NOTE — Telephone Encounter (Signed)
Patient calling upcoming cardiac imaging study; pt verbalizes understanding of appt date/time, parking situation and where to check in, pre-test NPO status, and verified current allergies; name and call back number provided for further questions should they arise  Larey Brick RN Navigator Cardiac Imaging [facility] [phone] office [phone] cell  Patient verbalized understanding of diet prep.

## 2023-05-06 ENCOUNTER — Ambulatory Visit (HOSPITAL_COMMUNITY)
Admission: RE | Admit: 2023-05-06 | Discharge: 2023-05-06 | Disposition: A | Discharge status: Home / Self Care | Payer: 59 | Source: Ambulatory Visit | Attending: Cardiology | Admitting: Cardiology

## 2023-05-06 DIAGNOSIS — I428 Other cardiomyopathies: Secondary | ICD-10-CM | POA: Insufficient documentation

## 2023-05-06 DIAGNOSIS — I5022 Chronic systolic (congestive) heart failure: Secondary | ICD-10-CM | POA: Diagnosis not present

## 2023-05-06 LAB — NM PET CT MYOCARDIAL SARCOIDOSIS
LV dias vol: 202 mL (ref 62–150)
Nuc Stress EF: 39 %
Rest Nuclear Isotope Dose: 25.9 mCi

## 2023-05-06 MED ORDER — RUBIDIUM RB82 GENERATOR (RUBYFILL)
25.9700 | PACK | Freq: Once | INTRAVENOUS | Status: AC
  Administered 2023-05-06: 25.97 via INTRAVENOUS

## 2023-05-06 MED ORDER — FLUDEOXYGLUCOSE F - 18 (FDG) INJECTION
8.9900 | Freq: Once | INTRAVENOUS | Status: AC
  Administered 2023-05-06: 8.99 via INTRAVENOUS

## 2023-05-07 ENCOUNTER — Ambulatory Visit (INDEPENDENT_AMBULATORY_CARE_PROVIDER_SITE_OTHER): Payer: 59 | Admitting: Family Medicine

## 2023-05-07 ENCOUNTER — Encounter: Payer: Self-pay | Admitting: Cardiology

## 2023-05-07 ENCOUNTER — Encounter (INDEPENDENT_AMBULATORY_CARE_PROVIDER_SITE_OTHER): Payer: Self-pay | Admitting: Family Medicine

## 2023-05-07 VITALS — BP 138/77 | HR 66 | Temp 98.0°F | Ht 67.0 in | Wt 224.0 lb

## 2023-05-07 DIAGNOSIS — E669 Obesity, unspecified: Secondary | ICD-10-CM | POA: Diagnosis not present

## 2023-05-07 DIAGNOSIS — Z6835 Body mass index (BMI) 35.0-35.9, adult: Secondary | ICD-10-CM | POA: Diagnosis not present

## 2023-05-07 DIAGNOSIS — I509 Heart failure, unspecified: Secondary | ICD-10-CM | POA: Diagnosis not present

## 2023-05-07 NOTE — Progress Notes (Signed)
.[facility]  Office: [phone]  /  Fax: [phone]  WEIGHT SUMMARY AND BIOMETRICS  Anthropometric Measurements Height: 5\' 7"  (1.702 m) Weight: 224 lb (101.6 kg) BMI (Calculated): 35.08 Weight at Last Visit: 221 lb Weight Lost Since Last Visit: 0 Weight Gained Since Last Visit: 3 lb Starting Weight: 232 lb Total Weight Loss (lbs): 8 lb (3.629 kg)   Body Composition  Body Fat %: 35.7 % Fat Mass (lbs): 80 lbs Muscle Mass (lbs): 136.8 lbs Total Body Water (lbs): 106.2 lbs Visceral Fat Rating : 21   Other Clinical Data Fasting: No Labs: No Today's Visit #: 14 Starting Date: 01/28/22    Chief Complaint: OBESITY  History of Present Illness   The patient, with a history of congestive heart failure (CHF) and obesity, presents for a follow-up visit. He reports a weight gain of three pounds over the past month and admits to adhering to his prescribed dietary plan approximately 60% of the time. He has been engaging in physical activity, walking for 45 minutes four times a week.  The patient has been experiencing some fluid retention, which he attributes to increased dining out due to several recent social events. He reports frequent urination, suggesting his body is attempting to eliminate excess fluid.  The patient also mentions a recent PET scan, which showed an enlarged left ventricular diastolic cavity and a reduced ejection fraction of 39%. He reports no changes in his medication regimen, which includes Entresto, spironolactone, and Jardiance for CHF management.  The patient acknowledges the need to improve dietary habits, particularly in the evenings when he tends to consume more sweet snacks. He expresses a commitment to continue his walking regimen and to work on reducing sodium intake and increasing protein consumption.          PHYSICAL EXAM:  Blood pressure 138/77, pulse 66, temperature 98 F (36.7 C), height 5\' 7"  (1.702 m), weight 224 lb (101.6 kg), SpO2 99%. Body  mass index is 35.08 kg/m.  DIAGNOSTIC DATA REVIEWED:  BMET    Component Value Date/Time   NA 138 02/25/2023 1048   K 4.6 02/25/2023 1048   CL 102 02/25/2023 1048   CO2 23 02/25/2023 1048   GLUCOSE 100 (H) 02/25/2023 1048   GLUCOSE 107 (H) 10/24/2022 0819   GLUCOSE 98 05/04/2006 0942   BUN 24 02/25/2023 1048   CREATININE 0.88 02/25/2023 1048   CALCIUM 9.9 02/25/2023 1048   GFRNONAA >60 10/24/2022 0819   GFRAA >60 03/10/2019 1337   Lab Results  Component Value Date   HGBA1C 5.1 10/24/2022   HGBA1C 8.1 05/20/2016   Lab Results  Component Value Date   INSULIN 10.3 01/29/2022   Lab Results  Component Value Date   TSH 1.810 01/29/2022   CBC    Component Value Date/Time   WBC 3.7 02/25/2023 1048   WBC 5.0 08/22/2022 0436   RBC 5.43 02/25/2023 1048   RBC 4.92 08/22/2022 0436   HGB 16.6 02/25/2023 1048   HCT 49.5 02/25/2023 1048   PLT 182 02/25/2023 1048   MCV 91 02/25/2023 1048   MCH 30.6 02/25/2023 1048   MCH 30.9 08/22/2022 0436   MCHC 33.5 02/25/2023 1048   MCHC 34.4 08/22/2022 0436   RDW 13.3 02/25/2023 1048   Iron Studies No results found for: "IRON", "TIBC", "FERRITIN", "IRONPCTSAT" Lipid Panel     Component Value Date/Time   CHOL 144 10/24/2022 0819   CHOL 127 01/29/2022 0815   TRIG 48 10/24/2022 0819   HDL 46 10/24/2022 0819  HDL 44 01/29/2022 0815   CHOLHDL 3.1 10/24/2022 0819   VLDL 10 10/24/2022 0819   LDLCALC 88 10/24/2022 0819   LDLCALC 70 01/29/2022 0815   LDLDIRECT 168.0 05/13/2016 0810   Hepatic Function Panel     Component Value Date/Time   PROT 7.1 08/21/2022 0303   PROT 7.0 01/29/2022 0815   ALBUMIN 4.1 08/21/2022 0303   ALBUMIN 4.2 01/29/2022 0815   AST 24 08/21/2022 0303   ALT 20 08/21/2022 0303   ALKPHOS 50 08/21/2022 0303   BILITOT 1.6 (H) 08/21/2022 0303   BILITOT 1.3 (H) 01/29/2022 0815   BILIDIR 0.2 05/13/2016 0810      Component Value Date/Time   TSH 1.810 01/29/2022 0815   Nutritional Lab Results  Component  Value Date   VD25OH 35.6 01/29/2022   VD25OH 33.70 09/20/2020     Assessment and Plan    Congestive Heart Failure (CHF) Recent weight gain of 3 pounds, half of which is likely due to fluid retention. Patient reports increased urination, suggesting some diuresis. Recent PET scan shows left ventricular function is abnormal with a reduced ejection fraction of 39% (normal >55%). Patient is currently on Entresto, Spironolactone, and Jardiance for CHF management. -Continue current medications:Entresto, Spironolactone, and Jardiance. -Encourage patient to monitor sodium intake, especially when eating out. -Continue exercise regimen of walking 45 minutes four times a week. -Follow up with Dr. Shirlee Latch on 06/01/2023 to discuss PET scan results and potential medication adjustments.  Obesity Patient reports difficulty adhering to category three eating plan, particularly in the evenings. Recent weight gain of 3 pounds, half of which is likely due to fat accumulation. -Encourage patient to continue with category three eating plan, focusing on meal planning and reducing evening snacking. -Continue exercise regimen of walking 45 minutes four times a week. -Schedule follow-up appointments before Thanksgiving and Christmas to monitor progress and provide support.         I have personally spent 30 minutes total time today in preparation, patient care, and documentation for this visit, including the following: review of clinical lab tests; review of medical tests/procedures/services.    He was informed of the importance of frequent follow up visits to maximize his success with intensive lifestyle modifications for his multiple health conditions.    Quillian Quince, MD

## 2023-05-18 DIAGNOSIS — H353211 Exudative age-related macular degeneration, right eye, with active choroidal neovascularization: Secondary | ICD-10-CM | POA: Diagnosis not present

## 2023-05-18 DIAGNOSIS — H2513 Age-related nuclear cataract, bilateral: Secondary | ICD-10-CM | POA: Diagnosis not present

## 2023-05-18 DIAGNOSIS — H401132 Primary open-angle glaucoma, bilateral, moderate stage: Secondary | ICD-10-CM | POA: Diagnosis not present

## 2023-05-18 DIAGNOSIS — E119 Type 2 diabetes mellitus without complications: Secondary | ICD-10-CM | POA: Diagnosis not present

## 2023-06-01 ENCOUNTER — Ambulatory Visit: Payer: 59 | Attending: Cardiology | Admitting: Cardiology

## 2023-06-01 ENCOUNTER — Encounter (INDEPENDENT_AMBULATORY_CARE_PROVIDER_SITE_OTHER): Payer: Self-pay | Admitting: Adult Health

## 2023-06-01 ENCOUNTER — Ambulatory Visit (INDEPENDENT_AMBULATORY_CARE_PROVIDER_SITE_OTHER): Payer: 59 | Admitting: Adult Health

## 2023-06-01 ENCOUNTER — Encounter: Payer: Self-pay | Admitting: Cardiology

## 2023-06-01 VITALS — BP 128/74 | HR 66 | Temp 97.7°F | Ht 67.0 in | Wt 228.0 lb

## 2023-06-01 VITALS — BP 117/72 | HR 73 | Wt 235.0 lb

## 2023-06-01 DIAGNOSIS — I509 Heart failure, unspecified: Secondary | ICD-10-CM

## 2023-06-01 DIAGNOSIS — E669 Obesity, unspecified: Secondary | ICD-10-CM | POA: Diagnosis not present

## 2023-06-01 DIAGNOSIS — Z6836 Body mass index (BMI) 36.0-36.9, adult: Secondary | ICD-10-CM | POA: Diagnosis not present

## 2023-06-01 DIAGNOSIS — Z7984 Long term (current) use of oral hypoglycemic drugs: Secondary | ICD-10-CM

## 2023-06-01 DIAGNOSIS — I5022 Chronic systolic (congestive) heart failure: Secondary | ICD-10-CM

## 2023-06-01 DIAGNOSIS — E1169 Type 2 diabetes mellitus with other specified complication: Secondary | ICD-10-CM | POA: Diagnosis not present

## 2023-06-01 MED ORDER — AMLODIPINE BESYLATE 5 MG PO TABS: 5.0000 mg | ORAL_TABLET | Freq: Every day | ORAL | 3 refills | Status: DC

## 2023-06-01 MED ORDER — ISOSORB DINITRATE-HYDRALAZINE 20-37.5 MG PO TABS: 1.0000 | ORAL_TABLET | Freq: Three times a day (TID) | ORAL | 6 refills | Status: DC

## 2023-06-01 NOTE — Progress Notes (Signed)
PCP: Deeann Saint, MD Cardiology: Dr. Mariah Milling HF Cardiology: Dr. Shirlee Latch  62 y.o. with history of DM2, HTN, and chronic systolic CHF/nonischemic cardiomyopathy was referred by Dr. Mariah Milling for evaluation of CHF.  Patient noted bradycardia on his Apple Watch last fall.  No lightheadedness/syncope.  His metoprolol (on for HTN) was decreased and he was referred for an echo in 11/23, which showed EF 35-40%, mild LV dilation, normal RV.  Subsequent coronary CTA showed minimal CAD.  He was admitted in 2/24 to [facility] with presyncopal event that was thought to be vagally-mediated.  He has had no episodes of syncope/presyncope since that time. He does not have a strong FH of CHF, father had "heart failure" along with other chronic illnesses when he was of advanced age.  No h/o CHF or cardiomyopathy among his siblings.    Cardiac MRI in 7/24 showed mild LV dilation with EF 30%, diffuse hypokinesis, RV EF 34%, mid-wall LGE in the basal to mid inferoseptal wall.  Cardiac PET in 10/24 showed EF 39%, no coronary calcium, no FDG uptake so no evidence for active inflammation.   Patient has been doing well in general.  No dyspnea walking on flat ground or up stairs.  No chest pain.  No lightheadedness.  No orthopnea/PND.  No palpitations. He had a headache when he first started Bidil but it resolved.  Weight down 1 lb.   Labs (3/24): K 4.3, creatinine 0.89 Labs (4/24): K 4.3, creatinine 0.85 Labs (8/24): K 4.6, creatinine 0.88, ACE level normal  PMH: 1. Type 2 diabetes 2. HTN 3. Nephrolithiasis 4. H/o bradycardia 5. Chronic systolic CHF: Nonischemic cardiomyopathy. Coronary CTA in 12/23 showed minimal CAD (calcium score 3.42, 29th percentile).  Echo (11/23) with EF 35-40%, mild LV dilation, normal RV.  - Cardiac MRI (7/24): Mild LV dilation with EF 30%, diffuse hypokinesis, RV EF 34%, mid-wall LGE in the basal to mid inferoseptal wall.  - Cardiac PET (10/24): EF 39%, no coronary calcium, no FDG uptake so no  evidence for active inflammation.  6. Sleep study negative  FH: Father with CHF in 53s, mother with PPM.  No sudden death.  Siblings with no CHF/cardiomyopathy.   Social History   Socioeconomic History   Marital status: Married    Spouse name: Not on file   Number of children: 2   Years of education: Not on file   Highest education level: Not on file  Occupational History   Occupation: Retired supply Scientist, product/process development  Tobacco Use   Smoking status: Never   Smokeless tobacco: Never  Vaping Use   Vaping status: Never Used  Substance and Sexual Activity   Alcohol use: Yes    Alcohol/week: 0.0 standard drinks of alcohol    Comment: occasionally   Drug use: No   Sexual activity: Not on file  Other Topics Concern   Not on file  Social History Narrative   Not on file   Social Determinants of Health   Financial Resource Strain: Not on file  Food Insecurity: Not on file  Transportation Needs: Not on file  Physical Activity: Not on file  Stress: Not on file  Social Connections: Not on file  Intimate Partner Violence: Not on file   ROS: All systems reviewed and negative except as per HPI.   Current Outpatient Medications  Medication Sig Dispense Refill   alfuzosin (UROXATRAL) 10 MG 24 hr tablet Take 10 mg by mouth daily.     aspirin EC 81 MG tablet Take 81 mg  by mouth daily.     atorvastatin (LIPITOR) 40 MG tablet TAKE 1 TABLET BY MOUTH EVERY DAY 90 tablet 2   betamethasone dipropionate 0.05 % lotion Apply 1 Application topically 2 (two) times daily. (Apply thin layer to scalp and face)     cholecalciferol (VITAMIN D3) 25 MCG (1000 UNIT) tablet Take 1,000 Units by mouth daily.     Elderberry 500 MG CAPS Take 1 capsule by mouth daily.     empagliflozin (JARDIANCE) 25 MG TABS tablet Take 1 tablet (25 mg total) by mouth daily. 90 tablet 3   ketoconazole (NIZORAL) 2 % shampoo Apply 1 Application topically 3 (three) times a week.     latanoprost (XALATAN) 0.005 % ophthalmic  solution Place 1 drop into both eyes every evening.     metoprolol succinate (TOPROL-XL) 25 MG 24 hr tablet Take 1 tablet (25 mg total) by mouth daily. Take with or immediately following a meal. 90 tablet 3   Multiple Vitamin (MULTIVITAMIN ADULT PO) Take 1 tablet by mouth daily.     pantoprazole (PROTONIX) 20 MG tablet Take 20 mg by mouth daily as needed for heartburn.     sacubitril-valsartan (ENTRESTO) 97-103 MG TAKE 1 TABLET BY MOUTH TWICE A DAY 60 tablet 11   spironolactone (ALDACTONE) 25 MG tablet Take 1 tablet (25 mg total) by mouth daily. 90 tablet 3   timolol (TIMOPTIC) 0.5 % ophthalmic solution Place 1 drop into both eyes daily.     amLODipine (NORVASC) 5 MG tablet Take 1 tablet (5 mg total) by mouth daily. 90 tablet 3   isosorbide-hydrALAZINE (BIDIL) 20-37.5 MG tablet Take 1 tablet by mouth 3 (three) times daily. 90 tablet 6   No current facility-administered medications for this visit.   BP 117/72   Pulse 73   Wt 235 lb (106.6 kg)   SpO2 100%   BMI 36.81 kg/m  General: NAD Neck: No JVD, no thyromegaly or thyroid nodule.  Lungs: Clear to auscultation bilaterally with normal respiratory effort. CV: Nondisplaced PMI.  Heart regular S1/S2, no S3/S4, no murmur.  No peripheral edema.  No carotid bruit.  Normal pedal pulses.  Abdomen: Soft, nontender, no hepatosplenomegaly, no distention.  Skin: Intact without lesions or rashes.  Neurologic: Alert and oriented x 3.  Psych: Normal affect. Extremities: No clubbing or cyanosis.  HEENT: Normal.   Assessment/Plan: 1. Chronic systolic CHF:  Nonischemic cardiomyopathy.  Coronary CTA in 12/23 showed minimal CAD (calcium score 3.42, 29th percentile).  Echo (11/23) with EF 35-40%, mild LV dilation, normal RV.  Cause of cardiomyopathy is uncertain.  He does not have a strong family history.  No h/o drug or ETOH abuse.  HTN could play a role though BP has been controlled.  Cardiac MRI in 7/24 showed mild LV dilation with EF 30%, diffuse  hypokinesis, RV EF 34%, mid-wall LGE in the basal to mid inferoseptal wall.  Possible prior viral myocarditis, cardiomyopathy was diagnosed after a COVID episode.  Mid-wall LGE also could be seen with cardiac sarcoidosis though CT chest in 12/23 showed no signs of pulmonary sarcoidosis. Cardiac PET in 10/24 showed EF 39%, no coronary calcium, no FDG uptake so no evidence for active inflammation, not suggestive of cardiac sarcoidosis.  NYHA class I, not volume overloaded on exam.  - Continue Entresto 97/103 bid. BMET/BNP today.  - Continue Jardiance - Continue spironolactone 25 daily.  - Continue Toprol XL 25 mg daily.  Have not increased with h/o sinus bradycardia.  - Increase Bidil to 1 tab  tid and decrease amlodipine to 5 mg daily.  - Echo at followup in 3 months.  2. Sinus bradycardia: HR in 50s currently.  He had a presyncopal episode with HR in 30s-40s in 2/24, thought to be vagal.  No further lightheadedness.   - Would continue current Toprol XL but will not increase dose.   Followup in 3 months with echo.   Marca Ancona 06/01/2023

## 2023-06-01 NOTE — Patient Instructions (Signed)
INCREASE BIDIL TO 1 TAB Three times a day  DECREASE Amlodipine to 5 mg daily.  Go DOWN to LOWER LEVEL (LL) to have your blood work completed inside of [employer] office.  We will only call you if the results are abnormal or if the provider would like to make medication changes.  Please have your echo completed. You will check in for this at the [facility]. You have to arrive 15 MINS EARLY for preparation, otherwise you will have to reschedule.   Your physician recommends that you schedule a follow-up appointment in: 3 months with an echocardiogram (February 2025) ** PLEASE CALL THE OFFICE IN MID DECEMBER TO ARRANGE YOUR FOLLOW UP APPOINTMENT.**

## 2023-06-01 NOTE — Progress Notes (Signed)
WEIGHT SUMMARY AND BIOMETRICS  Vitals Temp: 97.7 F (36.5 C) BP: 128/74 Pulse Rate: 66 SpO2: 99 %   Anthropometric Measurements Height: 5\' 7"  (1.702 m) Weight: 228 lb (103.4 kg) BMI (Calculated): 35.7 Weight at Last Visit: 224lb Weight Lost Since Last Visit: 0 Weight Gained Since Last Visit: 4lb Starting Weight: 232lb Total Weight Loss (lbs): 4 lb (1.814 kg)   Body Composition  Body Fat %: 35.6 % Fat Mass (lbs): 81.4 lbs Muscle Mass (lbs): 139.8 lbs Total Body Water (lbs): 108.4 lbs Visceral Fat Rating : 21   Other Clinical Data Fasting: no Labs: no Today's Visit #: 15 Starting Date: 01/28/22    Chief Complaint:   OBESITY Robert Mcguire is here to discuss his progress with his obesity treatment plan. He is on the the Category 3 Plan and states he is following his eating plan approximately 50 % of the time. He states he is exercising walking 45 minutes 4 times per week.   Interim History:  Reviewed Bioimpedance results with pt: Muscle Mass: + 3 lbs Adipose Mass: + 1.4 lbs Water Weight: + 2.2 lbs- he denies lower extremity edema or dyspnea  Robert Mcguire provided the following food recall that is typical of a day: Breakfast: Eggs with [place] sausage, 1/2 english muffin, coffee with 8 oz Skim Fair Life Milk Lunch: Often eats out or [place] sandwich- he uses food scale to ensure at least 4 oz meat. Dinner: Protein,vegetables, cup of CHO (rice or mashed potatoes).  Subjective:   1. Congestive heart failure with decreased EF (HCC) He is followed by Heart Failure [facility] Seen by Dr. Shirlee Latch this morning. Repeat ECHO ordered Last study ECHO COMPLETE 05/19/2022  1. Left ventricular ejection fraction, by estimation, is 35 to 40%.   2. Type 2 diabetes mellitus with other specified complication, without long-term current use of insulin (HCC) Lab Results  Component Value Date   HGBA1C 5.1 10/24/2022   HGBA1C 5.3 01/29/2022   HGBA1C 5.6 09/30/2021    A1c at goal He  reports intermittent sx's of hypoglycemia several mornings a week, sx: flushing He does not check CBG- out of test strips He is on Jardiance 25mg  once daily. He prefers not to use either GLP-1 or GIP/GLP-1 therapy.  Assessment/Plan:   1. Congestive heart failure with decreased EF (HCC) Continue with current medication regime per [facility]  Monitor for sx's F/u with Dr. Shirlee Latch as directed  2. Type 2 diabetes mellitus with other specified complication, without long-term current use of insulin (HCC) Check home glucometer and send MyChart message with what test strips are compatible  Will send in order after this information is received  3. BMI 35.7  Robert Mcguire is currently in the action stage of change. As such, his goal is to continue with weight loss efforts. He has agreed to the Category 3 Plan.   Exercise goals: For substantial health benefits, adults should do at least 150 minutes (2 hours and 30 minutes) a week of moderate-intensity, or 75 minutes (1 hour and 15 minutes) a week of vigorous-intensity aerobic physical activity, or an equivalent combination of moderate- and vigorous-intensity aerobic activity. Aerobic activity should be performed in episodes of at least 10 minutes, and preferably, it should be spread throughout the week.  Behavioral modification strategies: increasing lean protein intake, decreasing simple carbohydrates, increasing vegetables, increasing water intake, decreasing eating out, no skipping meals, meal planning and cooking strategies, keeping healthy foods in the home, and planning for success.  [name] has agreed  to follow-up with our clinic in 4 weeks. He was informed of the importance of frequent follow-up visits to maximize his success with intensive lifestyle modifications for his multiple health conditions.   Check Fasting Labs at next OV  Objective:   Blood pressure 128/74, pulse 66, temperature 97.7 F (36.5 C), height 5\' 7"  (1.702 m), weight 228 lb  (103.4 kg), SpO2 99%. Body mass index is 35.71 kg/m.  General: Cooperative, alert, well developed, in no acute distress. HEENT: Conjunctivae and lids unremarkable. Cardiovascular: Regular rhythm.  Lungs: Normal work of breathing. Neurologic: No focal deficits.   Lab Results  Component Value Date   CREATININE 0.88 02/25/2023   BUN 24 02/25/2023   NA 138 02/25/2023   K 4.6 02/25/2023   CL 102 02/25/2023   CO2 23 02/25/2023   Lab Results  Component Value Date   ALT 20 08/21/2022   AST 24 08/21/2022   ALKPHOS 50 08/21/2022   BILITOT 1.6 (H) 08/21/2022   Lab Results  Component Value Date   HGBA1C 5.1 10/24/2022   HGBA1C 5.3 01/29/2022   HGBA1C 5.6 09/30/2021   HGBA1C 5.5 03/15/2021   HGBA1C 5.4 09/20/2020   Lab Results  Component Value Date   INSULIN 10.3 01/29/2022   Lab Results  Component Value Date   TSH 1.810 01/29/2022   Lab Results  Component Value Date   CHOL 144 10/24/2022   HDL 46 10/24/2022   LDLCALC 88 10/24/2022   LDLDIRECT 168.0 05/13/2016   TRIG 48 10/24/2022   CHOLHDL 3.1 10/24/2022   Lab Results  Component Value Date   VD25OH 35.6 01/29/2022   VD25OH 33.70 09/20/2020   Lab Results  Component Value Date   WBC 3.7 02/25/2023   HGB 16.6 02/25/2023   HCT 49.5 02/25/2023   MCV 91 02/25/2023   PLT 182 02/25/2023   No results found for: "IRON", "TIBC", "FERRITIN"  Attestation Statements:   Reviewed by clinician on day of visit: allergies, medications, problem list, medical history, surgical history, family history, social history, and previous encounter notes.  Time spent on visit including pre-visit chart review and post-visit care and charting was 27 minutes.   I have reviewed the above documentation for accuracy and completeness, and I agree with the above. -  Imir Brumbach d. Kirt Chew, NP-C

## 2023-06-02 LAB — BASIC METABOLIC PANEL
BUN/Creatinine Ratio: 20 (ref 10–24)
BUN: 19 mg/dL (ref 8–27)
CO2: 22 mmol/L (ref 20–29)
Calcium: 9.8 mg/dL (ref 8.6–10.2)
Chloride: 104 mmol/L (ref 96–106)
Creatinine, Ser: 0.96 mg/dL (ref 0.76–1.27)
Glucose: 130 mg/dL — ABNORMAL HIGH (ref 70–99)
Potassium: 4.2 mmol/L (ref 3.5–5.2)
Sodium: 138 mmol/L (ref 134–144)
eGFR: 89 mL/min/{1.73_m2} (ref >59)

## 2023-06-02 LAB — BRAIN NATRIURETIC PEPTIDE: BNP: 554.5 pg/mL — ABNORMAL HIGH (ref 0.0–100.0)

## 2023-06-16 DIAGNOSIS — H353211 Exudative age-related macular degeneration, right eye, with active choroidal neovascularization: Secondary | ICD-10-CM | POA: Diagnosis not present

## 2023-06-24 ENCOUNTER — Ambulatory Visit (INDEPENDENT_AMBULATORY_CARE_PROVIDER_SITE_OTHER): Payer: 59 | Admitting: Family Medicine

## 2023-06-24 ENCOUNTER — Encounter (INDEPENDENT_AMBULATORY_CARE_PROVIDER_SITE_OTHER): Payer: Self-pay | Admitting: Family Medicine

## 2023-06-24 VITALS — BP 137/78 | HR 60 | Temp 97.9°F | Ht 67.0 in | Wt 228.0 lb

## 2023-06-24 DIAGNOSIS — E119 Type 2 diabetes mellitus without complications: Secondary | ICD-10-CM

## 2023-06-24 DIAGNOSIS — E669 Obesity, unspecified: Secondary | ICD-10-CM

## 2023-06-24 DIAGNOSIS — I509 Heart failure, unspecified: Secondary | ICD-10-CM | POA: Diagnosis not present

## 2023-06-24 DIAGNOSIS — Z6835 Body mass index (BMI) 35.0-35.9, adult: Secondary | ICD-10-CM

## 2023-06-24 DIAGNOSIS — E1169 Type 2 diabetes mellitus with other specified complication: Secondary | ICD-10-CM | POA: Diagnosis not present

## 2023-06-24 DIAGNOSIS — E782 Mixed hyperlipidemia: Secondary | ICD-10-CM | POA: Diagnosis not present

## 2023-06-24 DIAGNOSIS — E559 Vitamin D deficiency, unspecified: Secondary | ICD-10-CM

## 2023-06-24 NOTE — Progress Notes (Signed)
.smr  Office: [phone]  /  Fax: [phone]  WEIGHT SUMMARY AND BIOMETRICS  Anthropometric Measurements Height: 5\' 7"  (1.702 m) Weight: 228 lb (103.4 kg) BMI (Calculated): 35.7 Weight at Last Visit: 228 lb Weight Lost Since Last Visit: 0 Weight Gained Since Last Visit: 0 Starting Weight: 232 lb Total Weight Loss (lbs): 4 lb (1.814 kg)   Body Composition  Body Fat %: 35.6 % Fat Mass (lbs): 81.4 lbs Muscle Mass (lbs): 139.8 lbs Total Body Water (lbs): 106 lbs Visceral Fat Rating : 21   Other Clinical Data Fasting: Yes Labs: Yes Today's Visit #: 16 Starting Date: 01/28/22    Chief Complaint: OBESITY   History of Present Illness   The patient, with a history of type 2 diabetes, mixed hyperlipidemia, vitamin D deficiency, and obesity, presents for a routine follow-up. He has been adhering to a category three eating plan about fifty percent of the time and walking for exercise forty minutes four times per week. He has maintained his weight over the last month.  The patient reports a recent change in his cardiac medication regimen, with an increase in his isosorbide dosage from half a pill to a full pill three times a day. He denies any issues with shortness of breath but describes an occasional feeling of unnatural breathing, particularly in the mornings.  The patient acknowledges not regularly monitoring his blood sugar levels and expresses a desire to start doing so, especially when feeling unwell. He has a glucometer but requires additional testing strips. He also expresses interest in continuous glucose monitors but understands the limitations regarding insurance coverage.  The patient plans to maintain his current dietary status over the holiday period and is aware of the need for sodium control in his diet, particularly in relation to heart function and fluid retention. He understands the importance of fresh or frozen foods over canned or prepackaged options due to  sodium content.  The patient denies any need for additional medication refills at this time, aside from the requested glucose testing strips.          PHYSICAL EXAM:  Blood pressure 137/78, pulse 60, temperature 97.9 F (36.6 C), height 5\' 7"  (1.702 m), weight 228 lb (103.4 kg), SpO2 99%. Body mass index is 35.71 kg/m.  DIAGNOSTIC DATA REVIEWED:  BMET    Component Value Date/Time   NA 138 06/01/2023 1035   K 4.2 06/01/2023 1035   CL 104 06/01/2023 1035   CO2 22 06/01/2023 1035   GLUCOSE 130 (H) 06/01/2023 1035   GLUCOSE 107 (H) 10/24/2022 0819   GLUCOSE 98 05/04/2006 0942   BUN 19 06/01/2023 1035   CREATININE 0.96 06/01/2023 1035   CALCIUM 9.8 06/01/2023 1035   GFRNONAA >60 10/24/2022 0819   GFRAA >60 03/10/2019 1337   Lab Results  Component Value Date   HGBA1C 5.1 10/24/2022   HGBA1C 8.1 05/20/2016   Lab Results  Component Value Date   INSULIN 10.3 01/29/2022   Lab Results  Component Value Date   TSH 1.810 01/29/2022   CBC    Component Value Date/Time   WBC 3.7 02/25/2023 1048   WBC 5.0 08/22/2022 0436   RBC 5.43 02/25/2023 1048   RBC 4.92 08/22/2022 0436   HGB 16.6 02/25/2023 1048   HCT 49.5 02/25/2023 1048   PLT 182 02/25/2023 1048   MCV 91 02/25/2023 1048   MCH 30.6 02/25/2023 1048   MCH 30.9 08/22/2022 0436   MCHC 33.5 02/25/2023 1048   MCHC 34.4 08/22/2022 0436  RDW 13.3 02/25/2023 1048   Iron Studies No results found for: "IRON", "TIBC", "FERRITIN", "IRONPCTSAT" Lipid Panel     Component Value Date/Time   CHOL 144 10/24/2022 0819   CHOL 127 01/29/2022 0815   TRIG 48 10/24/2022 0819   HDL 46 10/24/2022 0819   HDL 44 01/29/2022 0815   CHOLHDL 3.1 10/24/2022 0819   VLDL 10 10/24/2022 0819   LDLCALC 88 10/24/2022 0819   LDLCALC 70 01/29/2022 0815   LDLDIRECT 168.0 05/13/2016 0810   Hepatic Function Panel     Component Value Date/Time   PROT 7.1 08/21/2022 0303   PROT 7.0 01/29/2022 0815   ALBUMIN 4.1 08/21/2022 0303   ALBUMIN 4.2  01/29/2022 0815   AST 24 08/21/2022 0303   ALT 20 08/21/2022 0303   ALKPHOS 50 08/21/2022 0303   BILITOT 1.6 (H) 08/21/2022 0303   BILITOT 1.3 (H) 01/29/2022 0815   BILIDIR 0.2 05/13/2016 0810      Component Value Date/Time   TSH 1.810 01/29/2022 0815   Nutritional Lab Results  Component Value Date   VD25OH 35.6 01/29/2022   VD25OH 33.70 09/20/2020     Assessment and Plan    Type 2 Diabetes Mellitus Following eating plan 50% of the time and walking 40 minutes four times per week. Needs to start regularly measuring blood sugar, especially when unwell. Current medications do not cause hypoglycemia. Discussed continuous glucose monitors and insurance coverage; decided to continue with finger sticks due to non-insulin therapy. - Send prescription for Relion strips - Order labs for blood sugars, kidney function, and electrolytes  Obesity Maintained weight over the last month. Following eating plan 50% of the time and walking 40 minutes four times per week. Discussed portion control and protein intake, especially during the holiday season. - Continue current diet and exercise regimen - Discuss portion control and protein intake during holidays  Mixed Hyperlipidemia Working on diet and exercise to improve cholesterol levels. Labs will be drawn today to assess current status. - Order labs for cholesterol levels  Congestive Heart Failure (CHF) Recent cardiology visit with medication adjustment. No significant changes in symptoms such as shortness of breath. Discussed importance of monitoring sodium intake to prevent fluid retention and exacerbations. Explained that sodium levels in blood are regulated by kidneys and not directly indicative of dietary intake. - Order labs for kidney function - Discuss sodium intake and dietary modifications  Vitamin D Deficiency Known vitamin D deficiency. Labs will be drawn today to assess current status. - Order labs for vitamin D levels  General  Health Maintenance Discussed importance of maintaining a low-sodium diet to support heart function and prevent fluid retention. Provided education on identifying high-sodium foods and alternatives. - Educate on low-sodium diet and food choices  Follow-up - Schedule January and February appointments.          He was informed of the importance of frequent follow up visits to maximize his success with intensive lifestyle modifications for his multiple health conditions.    Quillian Quince, MD

## 2023-06-25 LAB — VITAMIN D 25 HYDROXY (VIT D DEFICIENCY, FRACTURES): Vit D, 25-Hydroxy: 42 ng/mL (ref 30.0–100.0)

## 2023-06-25 LAB — HEMOGLOBIN A1C
Est. average glucose Bld gHb Est-mCnc: 111 mg/dL
Hgb A1c MFr Bld: 5.5 % (ref 4.8–5.6)

## 2023-06-25 LAB — MICROALBUMIN / CREATININE URINE RATIO
Creatinine, Urine: 90.9 mg/dL
Microalb/Creat Ratio: 92 mg/g{creat} — ABNORMAL HIGH (ref 0–29)
Microalbumin, Urine: 83.4 ug/mL

## 2023-06-25 LAB — CMP14+EGFR
ALT: 22 [IU]/L (ref 0–44)
AST: 18 [IU]/L (ref 0–40)
Albumin: 4.4 g/dL (ref 3.9–4.9)
Alkaline Phosphatase: 50 [IU]/L (ref 44–121)
BUN/Creatinine Ratio: 22 (ref 10–24)
BUN: 20 mg/dL (ref 8–27)
Bilirubin Total: 1.4 mg/dL — ABNORMAL HIGH (ref 0.0–1.2)
CO2: 22 mmol/L (ref 20–29)
Calcium: 9.6 mg/dL (ref 8.6–10.2)
Chloride: 102 mmol/L (ref 96–106)
Creatinine, Ser: 0.9 mg/dL (ref 0.76–1.27)
Globulin, Total: 2.7 g/dL (ref 1.5–4.5)
Glucose: 99 mg/dL (ref 70–99)
Potassium: 4.4 mmol/L (ref 3.5–5.2)
Sodium: 140 mmol/L (ref 134–144)
Total Protein: 7.1 g/dL (ref 6.0–8.5)
eGFR: 96 mL/min/{1.73_m2} (ref >59)

## 2023-06-25 LAB — INSULIN, RANDOM: INSULIN: 7.9 u[IU]/mL (ref 2.6–24.9)

## 2023-06-25 LAB — LIPID PANEL WITH LDL/HDL RATIO
Cholesterol, Total: 123 mg/dL (ref 100–199)
HDL: 44 mg/dL (ref >39)
LDL Chol Calc (NIH): 67 mg/dL (ref 0–99)
LDL/HDL Ratio: 1.5 {ratio} (ref 0.0–3.6)
Triglycerides: 53 mg/dL (ref 0–149)
VLDL Cholesterol Cal: 12 mg/dL (ref 5–40)

## 2023-06-25 LAB — VITAMIN B12: Vitamin B-12: >2000 pg/mL — ABNORMAL HIGH (ref 232–1245)

## 2023-07-15 ENCOUNTER — Other Ambulatory Visit: Payer: Self-pay

## 2023-07-15 NOTE — Progress Notes (Signed)
 error

## 2023-07-21 DIAGNOSIS — H353211 Exudative age-related macular degeneration, right eye, with active choroidal neovascularization: Secondary | ICD-10-CM | POA: Diagnosis not present

## 2023-07-23 ENCOUNTER — Encounter (INDEPENDENT_AMBULATORY_CARE_PROVIDER_SITE_OTHER): Payer: Self-pay | Admitting: Family Medicine

## 2023-07-23 ENCOUNTER — Ambulatory Visit (INDEPENDENT_AMBULATORY_CARE_PROVIDER_SITE_OTHER): Payer: 59 | Admitting: Family Medicine

## 2023-07-23 VITALS — BP 154/83 | HR 70 | Temp 98.8°F | Ht 67.0 in | Wt 232.0 lb

## 2023-07-23 DIAGNOSIS — R809 Proteinuria, unspecified: Secondary | ICD-10-CM | POA: Diagnosis not present

## 2023-07-23 DIAGNOSIS — I110 Hypertensive heart disease with heart failure: Secondary | ICD-10-CM

## 2023-07-23 DIAGNOSIS — I509 Heart failure, unspecified: Secondary | ICD-10-CM

## 2023-07-23 DIAGNOSIS — E669 Obesity, unspecified: Secondary | ICD-10-CM | POA: Diagnosis not present

## 2023-07-23 DIAGNOSIS — Z6836 Body mass index (BMI) 36.0-36.9, adult: Secondary | ICD-10-CM | POA: Diagnosis not present

## 2023-07-23 DIAGNOSIS — I10 Essential (primary) hypertension: Secondary | ICD-10-CM

## 2023-07-23 NOTE — Progress Notes (Signed)
.smr  Office: [phone]  /  Fax: [phone]  WEIGHT SUMMARY AND BIOMETRICS  Anthropometric Measurements Height: 5\' 7"  (1.702 m) Weight: 232 lb (105.2 kg) BMI (Calculated): 36.33 Weight at Last Visit: 84.4 Weight Lost Since Last Visit: 0 Weight Gained Since Last Visit: 4 lb Starting Weight: 232 lb Total Weight Loss (lbs): 0 lb (0 kg)   Body Composition  Body Fat %: 36.3 % Fat Mass (lbs): 84.4 lbs Muscle Mass (lbs): 140.6 lbs Total Body Water (lbs): 110 lbs Visceral Fat Rating : 22   Other Clinical Data Fasting: No Labs: No Today's Visit #: 17 Starting Date: 01/28/22    Chief Complaint: OBESITY   History of Present Illness   The patient, with a history of obesity, hypertension, and heart failure, presents with concerns about elevated blood pressure readings and weight gain. He reports a 4-pound weight gain over the past month, which coincided with the holiday season. He admits to following his prescribed dietary plan only about 40% of the time and exercising for 40 minutes three times a week.  The patient also mentions a recent increase in travel, which has led to more eating out and potentially higher sodium intake. He denies any new health issues, but does note that he is currently receiving monthly injections in his right eye, which he is three months into. He reports no significant stress, no chest discomfort, and no headaches.  The patient acknowledges a decrease in his usual exercise routine due to weather and travel, but plans to resume his normal routine when travel decreases. He also reports no issues with laying flat or walking at a normal pace without feeling out of breath.  The patient is currently on amlodipine, Jardiance, metoprolol, Entresto, spironolactone, and isorbide dinitrate for blood pressure management. He expresses a desire to improve his evening snacking habits and prioritize healthy eating and exercise. He also expresses concern about recent lab  results, particularly an elevated microalbumin-creatinine ratio, indicating early kidney damage. He plans to discuss these results with his cardiologist at an upcoming appointment.          PHYSICAL EXAM:  Blood pressure (!) 154/83, pulse 70, temperature 98.8 F (37.1 C), height 5\' 7"  (1.702 m), weight 232 lb (105.2 kg), SpO2 99%. Body mass index is 36.34 kg/m.  DIAGNOSTIC DATA REVIEWED:  BMET    Component Value Date/Time   NA 140 06/24/2023 0804   K 4.4 06/24/2023 0804   CL 102 06/24/2023 0804   CO2 22 06/24/2023 0804   GLUCOSE 99 06/24/2023 0804   GLUCOSE 107 (H) 10/24/2022 0819   GLUCOSE 98 05/04/2006 0942   BUN 20 06/24/2023 0804   CREATININE 0.90 06/24/2023 0804   CALCIUM 9.6 06/24/2023 0804   GFRNONAA >60 10/24/2022 0819   GFRAA >60 03/10/2019 1337   Lab Results  Component Value Date   HGBA1C 5.5 06/24/2023   HGBA1C 8.1 05/20/2016   Lab Results  Component Value Date   INSULIN 7.9 06/24/2023   INSULIN 10.3 01/29/2022   Lab Results  Component Value Date   TSH 1.810 01/29/2022   CBC    Component Value Date/Time   WBC 3.7 02/25/2023 1048   WBC 5.0 08/22/2022 0436   RBC 5.43 02/25/2023 1048   RBC 4.92 08/22/2022 0436   HGB 16.6 02/25/2023 1048   HCT 49.5 02/25/2023 1048   PLT 182 02/25/2023 1048   MCV 91 02/25/2023 1048   MCH 30.6 02/25/2023 1048   MCH 30.9 08/22/2022 0436   MCHC 33.5 02/25/2023  1048   MCHC 34.4 08/22/2022 0436   RDW 13.3 02/25/2023 1048   Iron Studies No results found for: "IRON", "TIBC", "FERRITIN", "IRONPCTSAT" Lipid Panel     Component Value Date/Time   CHOL 123 06/24/2023 0804   TRIG 53 06/24/2023 0804   HDL 44 06/24/2023 0804   CHOLHDL 3.1 10/24/2022 0819   VLDL 10 10/24/2022 0819   LDLCALC 67 06/24/2023 0804   LDLDIRECT 168.0 05/13/2016 0810   Hepatic Function Panel     Component Value Date/Time   PROT 7.1 06/24/2023 0804   ALBUMIN 4.4 06/24/2023 0804   AST 18 06/24/2023 0804   ALT 22 06/24/2023 0804   ALKPHOS  50 06/24/2023 0804   BILITOT 1.4 (H) 06/24/2023 0804   BILIDIR 0.2 05/13/2016 0810      Component Value Date/Time   TSH 1.810 01/29/2022 0815   Nutritional Lab Results  Component Value Date   VD25OH 42.0 06/24/2023   VD25OH 35.6 01/29/2022   VD25OH 33.70 09/20/2020     Assessment and Plan    Congestive Heart Failure (CHF) Scheduled for echocardiogram on February 6th. No current chest discomfort or dyspnea. Weight gain due to water retention. Emphasized medication adherence, low-sodium diet, and monitoring for fluid overload. - Continue current medications - Discuss CHF management with cardiologist on February 6th - Encourage low-sodium diet  Hypertension Blood pressure elevated at 158/82 and 154/83 despite current medications (amlodipine, Jardiance, metoprolol, Entresto, spironolactone, isosorbide dinitrate). Contributing factors include recent weight gain and increased sodium intake. Intermittent adherence to dietary plan and reduced exercise. Emphasized strict blood pressure control to prevent kidney damage and cardiovascular complications. - Discuss blood pressure control with cardiologist on February 6th - Encourage reduction of sodium intake - Recheck blood pressure in three months  Microalbuminuria Microalbumin/creatinine ratio of 92, indicating early kidney damage. Previous values under 30 earlier in the year. Emphasized tight blood pressure and blood sugar control to prevent progression. - Recheck microalbumin/creatinine ratio in three months - Discuss kidney function with cardiologist on February 6th  Obesity Gained 4 pounds in the last month due to holiday eating and reduced exercise. Current exercise is 40 minutes three times per week but inconsistent. Emphasized benefits of consistent exercise and dietary modifications for weight loss and overall health. - Encourage consistent exercise routine - Advise on portion control and smart food choices - Recommend  increasing vegetable intake - Suggest low-sodium, high-protein snacks during travel  General Health Maintenance Recent blood work shows good control of glucose, kidney, and liver functions. Total bilirubin slightly elevated but not concerning. Cholesterol levels within target ranges. Vitamin D and B12 levels adequate. Emphasized hydration to reduce cravings and improve evening snacking habits. - Continue monitoring blood work - Encourage hydration to reduce cravings and improve evening snacking habits  Follow-up - Follow-up appointment on February 18th - Schedule follow-up appointments in March and April.         He was informed of the importance of frequent follow up visits to maximize his success with intensive lifestyle modifications for his multiple health conditions.    Quillian Quince, MD

## 2023-08-13 ENCOUNTER — Ambulatory Visit
Admission: RE | Admit: 2023-08-13 | Discharge: 2023-08-13 | Disposition: A | Discharge status: Home / Self Care | Payer: 59 | Source: Ambulatory Visit | Attending: Cardiology | Admitting: Cardiology

## 2023-08-13 ENCOUNTER — Ambulatory Visit (HOSPITAL_BASED_OUTPATIENT_CLINIC_OR_DEPARTMENT_OTHER): Payer: 59 | Admitting: Cardiology

## 2023-08-13 VITALS — BP 122/64 | HR 57 | Wt 234.0 lb

## 2023-08-13 DIAGNOSIS — I428 Other cardiomyopathies: Secondary | ICD-10-CM | POA: Insufficient documentation

## 2023-08-13 DIAGNOSIS — Z7984 Long term (current) use of oral hypoglycemic drugs: Secondary | ICD-10-CM | POA: Diagnosis not present

## 2023-08-13 DIAGNOSIS — I2510 Atherosclerotic heart disease of native coronary artery without angina pectoris: Secondary | ICD-10-CM | POA: Insufficient documentation

## 2023-08-13 DIAGNOSIS — I5022 Chronic systolic (congestive) heart failure: Secondary | ICD-10-CM | POA: Insufficient documentation

## 2023-08-13 DIAGNOSIS — Z8616 Personal history of COVID-19: Secondary | ICD-10-CM | POA: Diagnosis not present

## 2023-08-13 DIAGNOSIS — E119 Type 2 diabetes mellitus without complications: Secondary | ICD-10-CM | POA: Diagnosis not present

## 2023-08-13 DIAGNOSIS — R001 Bradycardia, unspecified: Secondary | ICD-10-CM | POA: Diagnosis not present

## 2023-08-13 DIAGNOSIS — I110 Hypertensive heart disease with heart failure: Secondary | ICD-10-CM | POA: Diagnosis not present

## 2023-08-13 DIAGNOSIS — Z79899 Other long term (current) drug therapy: Secondary | ICD-10-CM | POA: Diagnosis not present

## 2023-08-13 DIAGNOSIS — Z8249 Family history of ischemic heart disease and other diseases of the circulatory system: Secondary | ICD-10-CM | POA: Insufficient documentation

## 2023-08-13 LAB — ECHOCARDIOGRAM COMPLETE
AR max vel: 2.24 cm2
AV Area VTI: 2.34 cm2
AV Area mean vel: 2.29 cm2
AV Mean grad: 3 mm[Hg]
AV Peak grad: 5.8 mm[Hg]
Ao pk vel: 1.2 m/s
Area-P 1/2: 2.94 cm2
MV VTI: 1.51 cm2
S' Lateral: 4.3 cm

## 2023-08-13 MED ORDER — ISOSORB DINITRATE-HYDRALAZINE 20-37.5 MG PO TABS: 2.0000 | ORAL_TABLET | Freq: Three times a day (TID) | ORAL | 5 refills | Status: DC

## 2023-08-13 NOTE — Progress Notes (Signed)
*  PRELIMINARY RESULTS* Echocardiogram 2D Echocardiogram has been performed.  Robert Mcguire 08/13/2023, 9:30 AM

## 2023-08-13 NOTE — Progress Notes (Signed)
 PCP: Mercer Clotilda SAUNDERS, MD Cardiology: Dr. Perla HF Cardiology: Dr. Rolan  Chief complaint: CHF  63 y.o. with history of DM2, HTN, and chronic systolic CHF/nonischemic cardiomyopathy was referred by Dr. Gollan for evaluation of CHF.  Patient noted bradycardia on his Apple Watch last fall.  No lightheadedness/syncope.  His metoprolol  (on for HTN) was decreased and he was referred for an echo in 11/23, which showed EF 35-40%, mild LV dilation, normal RV.  Subsequent coronary CTA showed minimal CAD.  He was admitted in 2/24 to [facility] with presyncopal event that was thought to be vagally-mediated.  He has had no episodes of syncope/presyncope since that time. He does not have a strong FH of CHF, father had heart failure along with other chronic illnesses when he was of advanced age.  No h/o CHF or cardiomyopathy among his siblings.    Cardiac MRI in 7/24 showed mild LV dilation with EF 30%, diffuse hypokinesis, RV EF 34%, mid-wall LGE in the basal to mid inferoseptal wall.  Cardiac PET in 10/24 showed EF 39%, no coronary calcium , no FDG uptake so no evidence for active inflammation.   Echo was done today and reviewed, EF 35-40%, diffuse hypokinesis, mild RV dysfunction, technically difficult study.   Patient returns for followup of CHF.  BP runs high at times when he checks at home, but normal range here today.  He has been playing golf and goes to the gym about twice a week. No dyspnea or chest pain with usual activity.  No orthopnea/PND.  No palpitations or lightheadedness. No syncope.  HR remains in 50s generally.   Labs (3/24): K 4.3, creatinine 0.89 Labs (4/24): K 4.3, creatinine 0.85 Labs (8/24): K 4.6, creatinine 0.88, ACE level normal Labs (11/24): K 4.2, creatinine 0.96 Labs (12/24): LDL 67  PMH: 1. Type 2 diabetes 2. HTN 3. Nephrolithiasis 4. H/o bradycardia 5. Chronic systolic CHF: Nonischemic cardiomyopathy. Coronary CTA in 12/23 showed minimal CAD (calcium  score 3.42, 29th  percentile).  Echo (11/23) with EF 35-40%, mild LV dilation, normal RV.  - Cardiac MRI (7/24): Mild LV dilation with EF 30%, diffuse hypokinesis, RV EF 34%, mid-wall LGE in the basal to mid inferoseptal wall.  - Cardiac PET (10/24): EF 39%, no coronary calcium , no FDG uptake so no evidence for active inflammation.  - Echo (2/25): EF 35-40%, diffuse hypokinesis, mild RV dysfunction, technically difficult study.  6. Sleep study negative  FH: Father with CHF in 71s, mother with PPM.  No sudden death.  Siblings with no CHF/cardiomyopathy.   Social History   Socioeconomic History   Marital status: Married    Spouse name: Not on file   Number of children: 2   Years of education: Not on file   Highest education level: Not on file  Occupational History   Occupation: Retired supply scientist, product/process development  Tobacco Use   Smoking status: Never   Smokeless tobacco: Never  Vaping Use   Vaping status: Never Used  Substance and Sexual Activity   Alcohol use: Yes    Alcohol/week: 0.0 standard drinks of alcohol    Comment: occasionally   Drug use: No   Sexual activity: Not on file  Other Topics Concern   Not on file  Social History Narrative   Not on file   Social Drivers of Health   Financial Resource Strain: Not on file  Food Insecurity: Not on file  Transportation Needs: Not on file  Physical Activity: Not on file  Stress: Not on file  Social  Connections: Not on file  Intimate Partner Violence: Not on file   ROS: All systems reviewed and negative except as per HPI.   Current Outpatient Medications  Medication Sig Dispense Refill   alfuzosin  (UROXATRAL ) 10 MG 24 hr tablet Take 10 mg by mouth daily.     aspirin  EC 81 MG tablet Take 81 mg by mouth daily.     atorvastatin  (LIPITOR) 40 MG tablet TAKE 1 TABLET BY MOUTH EVERY DAY 90 tablet 2   betamethasone dipropionate 0.05 % lotion Apply 1 Application topically 2 (two) times daily. (Apply thin layer to scalp and face)     cholecalciferol  (VITAMIN D3) 25 MCG (1000 UNIT) tablet Take 1,000 Units by mouth daily.     Elderberry 500 MG CAPS Take 1 capsule by mouth daily.     empagliflozin  (JARDIANCE ) 25 MG TABS tablet Take 1 tablet (25 mg total) by mouth daily. 90 tablet 3   ketoconazole (NIZORAL) 2 % shampoo Apply 1 Application topically 3 (three) times a week.     latanoprost  (XALATAN ) 0.005 % ophthalmic solution Place 1 drop into both eyes every evening.     metoprolol  succinate (TOPROL -XL) 25 MG 24 hr tablet Take 1 tablet (25 mg total) by mouth daily. Take with or immediately following a meal. 90 tablet 3   Multiple Vitamin (MULTIVITAMIN ADULT PO) Take 1 tablet by mouth daily.     Multiple Vitamins-Minerals (PRESERVISION AREDS 2) CAPS Take 1 capsule by mouth in the morning and at bedtime.     pantoprazole  (PROTONIX ) 20 MG tablet Take 20 mg by mouth daily as needed for heartburn.     sacubitril -valsartan  (ENTRESTO ) 97-103 MG TAKE 1 TABLET BY MOUTH TWICE A DAY 60 tablet 11   spironolactone  (ALDACTONE ) 25 MG tablet Take 1 tablet (25 mg total) by mouth daily. 90 tablet 3   timolol  (TIMOPTIC ) 0.5 % ophthalmic solution Place 1 drop into both eyes daily.     isosorbide-hydrALAZINE  (BIDIL) 20-37.5 MG tablet Take 2 tablets by mouth 3 (three) times daily. 300 tablet 5   No current facility-administered medications for this visit.   BP 122/64   Pulse (!) 57   Wt 234 lb (106.1 kg)   SpO2 100%   BMI 36.65 kg/m  General: NAD Neck: No JVD, no thyromegaly or thyroid  nodule.  Lungs: Clear to auscultation bilaterally with normal respiratory effort. CV: Nondisplaced PMI.  Heart regular S1/S2, no S3/S4, no murmur.  No peripheral edema.  No carotid bruit.  Normal pedal pulses.  Abdomen: Soft, nontender, no hepatosplenomegaly, no distention.  Skin: Intact without lesions or rashes.  Neurologic: Alert and oriented x 3.  Psych: Normal affect. Extremities: No clubbing or cyanosis.  HEENT: Normal.   Assessment/Plan: 1. Chronic systolic CHF:   Nonischemic cardiomyopathy.  Coronary CTA in 12/23 showed minimal CAD (calcium  score 3.42, 29th percentile).  Echo (11/23) with EF 35-40%, mild LV dilation, normal RV.  Cause of cardiomyopathy is uncertain.  He does not have a strong family history.  No h/o drug or ETOH abuse.  HTN could play a role though BP has been controlled.  Cardiac MRI in 7/24 showed mild LV dilation with EF 30%, diffuse hypokinesis, RV EF 34%, mid-wall LGE in the basal to mid inferoseptal wall.  Possible prior viral myocarditis, cardiomyopathy was diagnosed after a COVID episode.  Mid-wall LGE also could be seen with cardiac sarcoidosis though CT chest in 12/23 showed no signs of pulmonary sarcoidosis. Cardiac PET in 10/24 showed EF 39%, no coronary calcium ,  no FDG uptake so no evidence for active inflammation, not suggestive of cardiac sarcoidosis. Echo was done today and reviewed, EF 35-40%, diffuse hypokinesis, mild RV dysfunction, technically difficult study.    NYHA class I-II, not volume overloaded on exam.  - Continue Entresto  97/103 bid. BMET/BNP today.  - Continue Jardiance  - Continue spironolactone  25 daily.  - Continue Toprol  XL 25 mg daily.  Will not increase with sinus bradycardia.  - Increase Bidil to 2 tabs tid  - Today's echo was technically difficult, in future will need to use Definity  contrast or do cardiac MRI.   2. Sinus bradycardia: HR in 50s currently.  He had a presyncopal episode with HR in 30s-40s in 2/24, thought to be vagal.  No further lightheadedness.   - Would continue current Toprol  XL but will not increase dose.   Followup in 3 months   I spent 32 minutes reviewing records, interviewing/examining patient, and managing orders.     Ezra Shuck 08/13/2023

## 2023-08-13 NOTE — Patient Instructions (Signed)
 INCREASE Bidil to 2 tabs Three times a day  Go DOWN to LOWER LEVEL (LL) to have your blood work completed inside of [employer] office.  We will only call you if the results are abnormal or if the provider would like to make medication changes.  Your physician recommends that you schedule a follow-up appointment in: 3 months ( May ) ** PLEASE CALL THE OFFICE IN MARCH TO ARRANGE YOUR FOLLOW UP APPOINTMENT.**  If you have any questions or concerns before your next appointment please send us  a message through [facility] or call our office at [phone].    TO LEAVE A MESSAGE FOR THE NURSE SELECT OPTION 2, PLEASE LEAVE A MESSAGE INCLUDING: YOUR NAME DATE OF BIRTH CALL BACK NUMBER REASON FOR CALL**this is important as we prioritize the call backs  YOU WILL RECEIVE A CALL BACK THE SAME DAY AS LONG AS YOU CALL BEFORE 4:00 PM

## 2023-08-14 LAB — BASIC METABOLIC PANEL
BUN/Creatinine Ratio: 20 (ref 10–24)
BUN: 21 mg/dL (ref 8–27)
CO2: 24 mmol/L (ref 20–29)
Calcium: 10.3 mg/dL — ABNORMAL HIGH (ref 8.6–10.2)
Chloride: 104 mmol/L (ref 96–106)
Creatinine, Ser: 1.03 mg/dL (ref 0.76–1.27)
Glucose: 102 mg/dL — ABNORMAL HIGH (ref 70–99)
Potassium: 4.8 mmol/L (ref 3.5–5.2)
Sodium: 140 mmol/L (ref 134–144)
eGFR: 82 mL/min/{1.73_m2} (ref >59)

## 2023-08-14 LAB — BRAIN NATRIURETIC PEPTIDE: BNP: 51.6 pg/mL (ref 0.0–100.0)

## 2023-08-22 ENCOUNTER — Other Ambulatory Visit: Payer: Self-pay | Admitting: Cardiovascular Disease

## 2023-08-24 NOTE — Telephone Encounter (Signed)
Last office visit:  09/09/22 with plan to f/u in 6 months Next office visit: none/does have active recall  Please schedule follow up visit.  Thanks!

## 2023-08-24 NOTE — Telephone Encounter (Signed)
Pt scheduled on 4/22 

## 2023-08-25 ENCOUNTER — Ambulatory Visit (INDEPENDENT_AMBULATORY_CARE_PROVIDER_SITE_OTHER): Payer: 59 | Admitting: Family Medicine

## 2023-08-28 DIAGNOSIS — H353211 Exudative age-related macular degeneration, right eye, with active choroidal neovascularization: Secondary | ICD-10-CM | POA: Diagnosis not present

## 2023-08-31 DIAGNOSIS — H401132 Primary open-angle glaucoma, bilateral, moderate stage: Secondary | ICD-10-CM | POA: Diagnosis not present

## 2023-09-14 DIAGNOSIS — H401132 Primary open-angle glaucoma, bilateral, moderate stage: Secondary | ICD-10-CM | POA: Diagnosis not present

## 2023-09-14 DIAGNOSIS — H2513 Age-related nuclear cataract, bilateral: Secondary | ICD-10-CM | POA: Diagnosis not present

## 2023-09-16 ENCOUNTER — Other Ambulatory Visit: Payer: Self-pay | Admitting: Cardiovascular Disease

## 2023-09-20 ENCOUNTER — Other Ambulatory Visit: Payer: Self-pay | Admitting: Family Medicine

## 2023-09-20 ENCOUNTER — Other Ambulatory Visit: Payer: Self-pay | Admitting: Cardiovascular Disease

## 2023-09-20 DIAGNOSIS — E782 Mixed hyperlipidemia: Secondary | ICD-10-CM

## 2023-09-20 DIAGNOSIS — E1169 Type 2 diabetes mellitus with other specified complication: Secondary | ICD-10-CM

## 2023-09-22 ENCOUNTER — Ambulatory Visit (INDEPENDENT_AMBULATORY_CARE_PROVIDER_SITE_OTHER): Payer: 59 | Admitting: Family Medicine

## 2023-09-22 ENCOUNTER — Encounter (INDEPENDENT_AMBULATORY_CARE_PROVIDER_SITE_OTHER): Payer: Self-pay | Admitting: Family Medicine

## 2023-09-22 DIAGNOSIS — E1169 Type 2 diabetes mellitus with other specified complication: Secondary | ICD-10-CM

## 2023-09-22 DIAGNOSIS — E669 Obesity, unspecified: Secondary | ICD-10-CM | POA: Diagnosis not present

## 2023-09-22 DIAGNOSIS — Z6836 Body mass index (BMI) 36.0-36.9, adult: Secondary | ICD-10-CM

## 2023-09-22 DIAGNOSIS — I509 Heart failure, unspecified: Secondary | ICD-10-CM | POA: Diagnosis not present

## 2023-09-22 DIAGNOSIS — Z7985 Long-term (current) use of injectable non-insulin antidiabetic drugs: Secondary | ICD-10-CM | POA: Diagnosis not present

## 2023-09-22 DIAGNOSIS — E119 Type 2 diabetes mellitus without complications: Secondary | ICD-10-CM

## 2023-09-22 DIAGNOSIS — E559 Vitamin D deficiency, unspecified: Secondary | ICD-10-CM

## 2023-09-22 DIAGNOSIS — R809 Proteinuria, unspecified: Secondary | ICD-10-CM | POA: Diagnosis not present

## 2023-09-22 NOTE — Progress Notes (Signed)
 Office: [phone]  /  Fax: [phone]  WEIGHT SUMMARY AND BIOMETRICS  Anthropometric Measurements Height: 5\' 7"  (1.702 m) Weight: 233 lb (105.7 kg) BMI (Calculated): 36.48 Weight at Last Visit: 232 lb Weight Lost Since Last Visit: 0 Weight Gained Since Last Visit: 1 lb Starting Weight: 232 lb Total Weight Loss (lbs): 0 lb (0 kg)   Body Composition  Body Fat %: 36.8 % Fat Mass (lbs): 85.8 lbs Muscle Mass (lbs): 140 lbs Total Body Water (lbs): 109 lbs Visceral Fat Rating : 22   Other Clinical Data Fasting: No Labs: Yes Today's Visit #: 18 Starting Date: 01/28/22    Chief Complaint: OBESITY    History of Present Illness   Robert Mcguire is a 63 year old male with obesity, type two diabetes, congestive heart failure, and coronary artery disease who presents for obesity treatment plan assessment and progress evaluation.  He has been following the category three eating plan approximately 75% of the time and exercises for 45 minutes five times per week. Despite these efforts, he has gained one pound since his last visit. He attributes this weight gain to an increase in muscle mass due to his exercise routine, which includes cardio and strength training primarily using machines at the gym. His hunger has improved, and his cravings have not been too bad, which he manages with healthy snacks.  He has a history of type two diabetes, with his most recent A1c well controlled at 5.5. He is actively working on diet, exercise, and weight loss to manage this condition. His recent lab work showed a slightly elevated calcium level. He takes vitamin D and a multivitamin for men over fifty.  He has congestive heart failure and coronary artery disease with non-ischemic cardiomyopathy. He is currently taking Bidal, which was recently adjusted to two tablets twice a day, three times a day. He does not feel that his blood pressure is low with this adjustment.  His microalbumin creatinine  ratio was elevated at 92, indicating early kidney issues. He is increasing hydration to support kidney function.  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 His wife is in real estate, which complements his activities.          PHYSICAL EXAM:  Blood pressure 119/72, pulse 72, temperature 98.9 F (37.2 C), height 5\' 7"  (1.702 m), weight 233 lb (105.7 kg), SpO2 99%. Body mass index is 36.49 kg/m.  DIAGNOSTIC DATA REVIEWED:  BMET    Component Value Date/Time   NA 140 08/13/2023 1023   K 4.8 08/13/2023 1023   CL 104 08/13/2023 1023   CO2 24 08/13/2023 1023   GLUCOSE 102 (H) 08/13/2023 1023   GLUCOSE 107 (H) 10/24/2022 0819   GLUCOSE 98 05/04/2006 0942   BUN 21 08/13/2023 1023   CREATININE 1.03 08/13/2023 1023   CALCIUM 10.3 (H) 08/13/2023 1023   GFRNONAA >60 10/24/2022 0819   GFRAA >60 03/10/2019 1337   Lab Results  Component Value Date   HGBA1C 5.5 06/24/2023   HGBA1C 8.1 05/20/2016   Lab Results  Component Value Date   INSULIN 7.9 06/24/2023   INSULIN 10.3 01/29/2022   Lab Results  Component Value Date   TSH 1.810 01/29/2022   CBC    Component Value Date/Time   WBC 3.7 02/25/2023 1048   WBC 5.0 08/22/2022 0436   RBC 5.43 02/25/2023 1048  RBC 4.92 08/22/2022 0436   HGB 16.6 02/25/2023 1048   HCT 49.5 02/25/2023 1048   PLT 182 02/25/2023 1048   MCV 91 02/25/2023 1048   MCH 30.6 02/25/2023 1048   MCH 30.9 08/22/2022 0436   MCHC 33.5 02/25/2023 1048   MCHC 34.4 08/22/2022 0436   RDW 13.3 02/25/2023 1048   Iron Studies No results found for: "IRON", "TIBC", "FERRITIN", "IRONPCTSAT" Lipid Panel     Component Value Date/Time   CHOL 123 06/24/2023 0804   TRIG 53 06/24/2023 0804   HDL 44 06/24/2023 0804   CHOLHDL 3.1 10/24/2022 0819   VLDL 10 10/24/2022 0819   LDLCALC 67 06/24/2023 0804   LDLDIRECT 168.0  05/13/2016 0810   Hepatic Function Panel     Component Value Date/Time   PROT 7.1 06/24/2023 0804   ALBUMIN 4.4 06/24/2023 0804   AST 18 06/24/2023 0804   ALT 22 06/24/2023 0804   ALKPHOS 50 06/24/2023 0804   BILITOT 1.4 (H) 06/24/2023 0804   BILIDIR 0.2 05/13/2016 0810      Component Value Date/Time   TSH 1.810 01/29/2022 0815   Nutritional Lab Results  Component Value Date   VD25OH 42.0 06/24/2023   VD25OH 35.6 01/29/2022   VD25OH 33.70 09/20/2020     Assessment and Plan    Obesity He is adhering to the category three eating plan 75% of the time and exercises for 45 minutes five times per week. Despite these efforts, he has gained one pound since the last visit, likely due to increased muscle mass from exercise. - Continue category three eating plan - Continue exercise regimen  Type 2 Diabetes Mellitus His hemoglobin A1c is well-controlled at 5.5. He manages his diabetes through diet, exercise, and Jardiance. - Check hemoglobin A1c - Continue Jardiance - Continue diet and exercise regimen  Microalbuminuria His microalbumin creatinine ratio is elevated at 92, indicating early kidney issues. Blood pressure and glucose control are crucial. He is advised to increase hydration to over 80 ounces per day to improve kidney function and prevent further elevation in BUN levels. - Increase hydration to over 80 ounces per day - Continue tight blood pressure and glucose control  Congestive Heart Failure His BNP has returned to normal, indicating no current fluid buildup. Blood pressure is well-controlled at 119/72, with occasional readings of 130 systolic in the afternoons, which is acceptable. - Continue current blood pressure management -Continue diet and exercise plan and minimize sodium intake  Elevated Calcium Level He has a slightly elevated calcium level, possibly related to vitamin D levels. Plan to check vitamin D levels to rule out over-replacement. - Order BMP and  vitamin D level       He was informed of the importance of frequent follow up visits to maximize his success with intensive lifestyle modifications for his multiple health conditions.    Quillian Quince, MD

## 2023-09-23 ENCOUNTER — Other Ambulatory Visit: Payer: Self-pay | Admitting: Family Medicine

## 2023-09-23 DIAGNOSIS — E782 Mixed hyperlipidemia: Secondary | ICD-10-CM

## 2023-09-23 DIAGNOSIS — E1169 Type 2 diabetes mellitus with other specified complication: Secondary | ICD-10-CM

## 2023-09-23 LAB — CMP14+EGFR
ALT: 24 IU/L (ref 0–44)
AST: 30 IU/L (ref 0–40)
Albumin: 4.2 g/dL (ref 3.9–4.9)
Alkaline Phosphatase: 46 IU/L (ref 44–121)
BUN/Creatinine Ratio: 20 (ref 10–24)
BUN: 18 mg/dL (ref 8–27)
Bilirubin Total: 1.1 mg/dL (ref 0.0–1.2)
CO2: 23 mmol/L (ref 20–29)
Calcium: 9.9 mg/dL (ref 8.6–10.2)
Chloride: 104 mmol/L (ref 96–106)
Creatinine, Ser: 0.91 mg/dL (ref 0.76–1.27)
Globulin, Total: 2.6 g/dL (ref 1.5–4.5)
Glucose: 98 mg/dL (ref 70–99)
Potassium: 4.5 mmol/L (ref 3.5–5.2)
Sodium: 139 mmol/L (ref 134–144)
Total Protein: 6.8 g/dL (ref 6.0–8.5)
eGFR: 95 mL/min/{1.73_m2} (ref >59)

## 2023-09-23 LAB — HEMOGLOBIN A1C
Est. average glucose Bld gHb Est-mCnc: 108 mg/dL
Hgb A1c MFr Bld: 5.4 % (ref 4.8–5.6)

## 2023-09-23 LAB — VITAMIN D 25 HYDROXY (VIT D DEFICIENCY, FRACTURES): Vit D, 25-Hydroxy: 40.6 ng/mL (ref 30.0–100.0)

## 2023-09-23 NOTE — Telephone Encounter (Signed)
 Copied from CRM [id number]. Topic: Clinical - Medication Refill >> Sep 23, 2023 11:26 AM Kathryne Eriksson wrote: Most Recent Primary Care Visit:  Provider: Abbe Amsterdam R  Department: [facility]  Visit Type: PHYSICAL  Date: 10/17/2022  Medication: JARDIANCE 25 MG TABS tablet [Pharmacy Med Name: JARDIANCE 25 MG TABLET] , atorvastatin (LIPITOR) 40 MG tablet  Has the patient contacted their pharmacy? Yes (Agent: If no, request that the patient contact the pharmacy for the refill. If patient does not wish to contact the pharmacy document the reason why and proceed with request.) (Agent: If yes, when and what did the pharmacy advise?)  Is this the correct pharmacy for this prescription? Yes If no, delete pharmacy and type the correct one.  This is the patient's preferred pharmacy:  [id number] - [address] [place] [address] Phone: [phone] Fax: [phone]    Has the prescription been filled recently? No  Is the patient out of the medication? Yes  Has the patient been seen for an appointment in the last year OR does the patient have an upcoming appointment? Yes  Can we respond through MyChart? Yes  Agent: Please be advised that Rx refills may take up to 3 business days. We ask that you follow-up with your pharmacy.

## 2023-10-20 ENCOUNTER — Ambulatory Visit (INDEPENDENT_AMBULATORY_CARE_PROVIDER_SITE_OTHER): Payer: 59 | Admitting: Family Medicine

## 2023-10-21 ENCOUNTER — Other Ambulatory Visit (HOSPITAL_COMMUNITY): Payer: Self-pay

## 2023-10-21 ENCOUNTER — Telehealth: Payer: Self-pay

## 2023-10-21 NOTE — Telephone Encounter (Signed)
 Pharmacy Patient Advocate Encounter  Received notification from [facility] that Prior Authorization for Jardiance 25MG  tablets has been APPROVED from 10/21/23 to 10/20/24. Ran test claim, Copay is $50. This test claim was processed through [facility]- copay amounts may vary at other pharmacies due to pharmacy/plan contracts, or as the patient moves through the different stages of their insurance plan.   PA #/Case ID/Reference #: [id number]  *spoke with [facility] to process

## 2023-10-26 DIAGNOSIS — H353211 Exudative age-related macular degeneration, right eye, with active choroidal neovascularization: Secondary | ICD-10-CM | POA: Diagnosis not present

## 2023-10-27 ENCOUNTER — Ambulatory Visit: Payer: 59 | Admitting: Cardiovascular Disease

## 2023-11-06 DIAGNOSIS — H353211 Exudative age-related macular degeneration, right eye, with active choroidal neovascularization: Secondary | ICD-10-CM | POA: Diagnosis not present

## 2023-11-18 ENCOUNTER — Encounter (INDEPENDENT_AMBULATORY_CARE_PROVIDER_SITE_OTHER): Payer: Self-pay | Admitting: Family Medicine

## 2023-11-18 ENCOUNTER — Ambulatory Visit (INDEPENDENT_AMBULATORY_CARE_PROVIDER_SITE_OTHER): Admitting: Family Medicine

## 2023-11-18 VITALS — BP 137/72 | HR 69 | Temp 98.4°F | Ht 67.0 in | Wt 234.0 lb

## 2023-11-18 DIAGNOSIS — E785 Hyperlipidemia, unspecified: Secondary | ICD-10-CM

## 2023-11-18 DIAGNOSIS — Z6836 Body mass index (BMI) 36.0-36.9, adult: Secondary | ICD-10-CM | POA: Diagnosis not present

## 2023-11-18 DIAGNOSIS — I10 Essential (primary) hypertension: Secondary | ICD-10-CM | POA: Diagnosis not present

## 2023-11-18 DIAGNOSIS — E669 Obesity, unspecified: Secondary | ICD-10-CM | POA: Diagnosis not present

## 2023-11-18 DIAGNOSIS — E7849 Other hyperlipidemia: Secondary | ICD-10-CM

## 2023-11-18 NOTE — Progress Notes (Signed)
 Office: [phone]  /  Fax: [phone]  WEIGHT SUMMARY AND BIOMETRICS  Anthropometric Measurements Height: 5\' 7"  (1.702 m) Weight: 234 lb (106.1 kg) BMI (Calculated): 36.64 Weight at Last Visit: 234 lb Weight Lost Since Last Visit: 0 Weight Gained Since Last Visit: 1 lb Starting Weight: 232 lb Total Weight Loss (lbs): 0 lb (0 kg) Peak Weight: 280 lb   Body Composition  Body Fat %: 37.2 % Fat Mass (lbs): 87.2 lbs Muscle Mass (lbs): 139.8 lbs Total Body Water (lbs): 108.6 lbs Visceral Fat Rating : 23   Other Clinical Data Fasting: no Labs: no Today's Visit #: 19 Starting Date: 01/28/22    Chief Complaint: OBESITY   History of Present Illness Robert Mcguire is a 63 year old male who presents for obesity treatment plan assessment.  He follows the category three eating plan about fifty percent of the time and exercises for forty-five minutes, five times per week, combining cardio and strengthening exercises. Despite these efforts, his weight has increased by one pound over the last two months.  He has a history of hyperlipidemia and is currently taking Lipitor 40 mg daily. He continues to work on decreasing cholesterol in his diet alongside his weight loss and exercise regimen.  He experiences vision changes, specifically noticing wavy or crooked flag poles [place] the [place], which led him to consult an eye doctor. He was diagnosed with age-related macular degeneration and has undergone four injections. His next injection is scheduled for the 27th. Despite these issues, his vision remains 20/20, although there is fluid buildup in the macula without any blood.  No eye issues from a diabetes standpoint. BP is controlled today.      PHYSICAL EXAM:  Blood pressure 137/72, pulse 69, temperature 98.4 F (36.9 C), height 5\' 7"  (1.702 m), weight 234 lb (106.1 kg), SpO2 99%. Body mass index is 36.65 kg/m.  DIAGNOSTIC DATA REVIEWED:  BMET    Component Value  Date/Time   NA 139 09/22/2023 1417   K 4.5 09/22/2023 1417   CL 104 09/22/2023 1417   CO2 23 09/22/2023 1417   GLUCOSE 98 09/22/2023 1417   GLUCOSE 107 (H) 10/24/2022 0819   GLUCOSE 98 05/04/2006 0942   BUN 18 09/22/2023 1417   CREATININE 0.91 09/22/2023 1417   CALCIUM  9.9 09/22/2023 1417   GFRNONAA >60 10/24/2022 0819   GFRAA >60 03/10/2019 1337   Lab Results  Component Value Date   HGBA1C 5.4 09/22/2023   HGBA1C 8.1 05/20/2016   Lab Results  Component Value Date   INSULIN  7.9 06/24/2023   INSULIN  10.3 01/29/2022   Lab Results  Component Value Date   TSH 1.810 01/29/2022   CBC    Component Value Date/Time   WBC 3.7 02/25/2023 1048   WBC 5.0 08/22/2022 0436   RBC 5.43 02/25/2023 1048   RBC 4.92 08/22/2022 0436   HGB 16.6 02/25/2023 1048   HCT 49.5 02/25/2023 1048   PLT 182 02/25/2023 1048   MCV 91 02/25/2023 1048   MCH 30.6 02/25/2023 1048   MCH 30.9 08/22/2022 0436   MCHC 33.5 02/25/2023 1048   MCHC 34.4 08/22/2022 0436   RDW 13.3 02/25/2023 1048   Iron Studies No results found for: "IRON", "TIBC", "FERRITIN", "IRONPCTSAT" Lipid Panel     Component Value Date/Time   CHOL 123 06/24/2023 0804   TRIG 53 06/24/2023 0804   HDL 44 06/24/2023 0804   CHOLHDL 3.1 10/24/2022 0819   VLDL 10 10/24/2022 0819  LDLCALC 67 06/24/2023 0804   LDLDIRECT 168.0 05/13/2016 0810   Hepatic Function Panel     Component Value Date/Time   PROT 6.8 09/22/2023 1417   ALBUMIN 4.2 09/22/2023 1417   AST 30 09/22/2023 1417   ALT 24 09/22/2023 1417   ALKPHOS 46 09/22/2023 1417   BILITOT 1.1 09/22/2023 1417   BILIDIR 0.2 05/13/2016 0810      Component Value Date/Time   TSH 1.810 01/29/2022 0815   Nutritional Lab Results  Component Value Date   VD25OH 40.6 09/22/2023   VD25OH 42.0 06/24/2023   VD25OH 35.6 01/29/2022     Assessment and Plan Assessment & Plan Obesity Obesity management is ongoing. He follows the category three eating plan about 50% of the time and  exercises 45 minutes five times per week. Weight increased by one pound in the last two months. Discussed challenges of maintaining weight loss and importance of dietary adherence. Introduced a low carbohydrate plan to reset cravings and boost metabolism after a weight loss plateau. - Provide copies of the low carbohydrate and category three eating plans. - Recommend strict adherence to the category three plan or consider the low carbohydrate plan for 2-4 weeks. - Encourage continued exercise regimen of 45 minutes five times per week. - Schedule follow-up in two months.  Hyperlipidemia Hyperlipidemia is managed with Lipitor 40 mg daily. He is working on dietary cholesterol reduction and weight loss to aid in management. - Continue Lipitor 40 mg daily. - Continue low cholesterol diet. - Encourage weight loss efforts. - Continue exercise regimen.  HTN BP controlled today, working on healthier eating habits, exercise and weight loss -Advise to go back to following his eating plan closely to improve results. -FU 8 w     He was informed of the importance of frequent follow up visits to maximize his success with intensive lifestyle modifications for his multiple health conditions.    Jasmine Mesi, MD

## 2023-12-01 ENCOUNTER — Telehealth: Payer: Self-pay | Admitting: Cardiology

## 2023-12-01 DIAGNOSIS — H353211 Exudative age-related macular degeneration, right eye, with active choroidal neovascularization: Secondary | ICD-10-CM | POA: Diagnosis not present

## 2023-12-01 NOTE — Telephone Encounter (Signed)
 Called to confirm/remind patient of their appointment at the [facility] on 12/02/23.   Appointment:   [] Confirmed  [] Left mess   [x] No answer/No voice mail  [] VM Full/unable to leave message  [] Phone not in service  Patient reminded to bring all medications and/or complete list.  Confirmed patient has transportation. Gave directions, instructed to utilize valet parking.

## 2023-12-02 ENCOUNTER — Ambulatory Visit: Attending: Cardiology | Admitting: Cardiology

## 2023-12-02 VITALS — BP 121/65 | HR 63 | Wt 236.0 lb

## 2023-12-02 DIAGNOSIS — Z79899 Other long term (current) drug therapy: Secondary | ICD-10-CM | POA: Insufficient documentation

## 2023-12-02 DIAGNOSIS — I428 Other cardiomyopathies: Secondary | ICD-10-CM | POA: Diagnosis not present

## 2023-12-02 DIAGNOSIS — R001 Bradycardia, unspecified: Secondary | ICD-10-CM | POA: Diagnosis not present

## 2023-12-02 DIAGNOSIS — I2510 Atherosclerotic heart disease of native coronary artery without angina pectoris: Secondary | ICD-10-CM | POA: Insufficient documentation

## 2023-12-02 DIAGNOSIS — I110 Hypertensive heart disease with heart failure: Secondary | ICD-10-CM | POA: Diagnosis not present

## 2023-12-02 DIAGNOSIS — Z7984 Long term (current) use of oral hypoglycemic drugs: Secondary | ICD-10-CM | POA: Insufficient documentation

## 2023-12-02 DIAGNOSIS — E119 Type 2 diabetes mellitus without complications: Secondary | ICD-10-CM | POA: Diagnosis not present

## 2023-12-02 DIAGNOSIS — I5022 Chronic systolic (congestive) heart failure: Secondary | ICD-10-CM | POA: Diagnosis not present

## 2023-12-02 MED ORDER — METOPROLOL SUCCINATE ER 50 MG PO TB24
50.0000 mg | ORAL_TABLET | Freq: Every day | ORAL | 3 refills | Status: DC
Start: 2023-12-02 — End: 2024-03-22

## 2023-12-02 NOTE — Patient Instructions (Addendum)
 Medication Changes:  INCREASE Metoprolol  50mg  (1 tab) daily  Lab Work:  Go DOWN to LOWER LEVEL (LL) to have your blood work completed inside of [employer] office.  We will only call you if the results are abnormal or if the provider would like to make medication changes.   Testing/Procedures:    [facility] [address] [place], [place] [address] Please go to the [facility] and check-in with the desk attendant.   Magnetic resonance imaging (MRI) is a painless test that produces images of the inside of the body without using Xrays.  During an MRI, strong magnets and radio waves work together in a Data processing manager to form detailed images.   MRI images may provide more details about a medical condition than X-rays, CT scans, and ultrasounds can provide.  You may be given earphones to listen for instructions.  You may eat a light breakfast and take medications as ordered with the exception of furosemide, hydrochlorothiazide, chlorthalidone or spironolactone  (or any other fluid pill). If you are undergoing a stress MRI, please avoid stimulants for 12 hr prior to test. (I.e. Caffeine, nicotine, chocolate, or antihistamine medications)  If your provider has ordered anti-anxiety medications for this test, then you will need a driver.  An IV will be inserted into one of your veins. Contrast material will be injected into your IV. It will leave your body through your urine within a day. You may be told to drink plenty of fluids to help flush the contrast material out of your system.  You will be asked to remove all metal, including: Watch, jewelry, and other metal objects including hearing aids, hair pieces and dentures. Also wearable glucose monitoring systems (ie. Freestyle Libre and Omnipods) (Braces and fillings normally are not a problem.)   TEST WILL TAKE APPROXIMATELY 1 HOUR  PLEASE NOTIFY SCHEDULING AT LEAST 24 HOURS IN ADVANCE IF YOU ARE UNABLE TO  KEEP YOUR APPOINTMENT. [phone]  For more information and frequently asked questions, please visit our website : [email or web address]  Please call the Cardiac Imaging Nurse Navigators with any questions/concerns. [phone] Office    Follow-Up in: Please follow up with the [facility] in 4 months with Dr. Mitzie Anda. We do not currently have that schedule. Please give us  a call in August in order to schedule your appointment for September.  At the [facility], you and your health needs are our priority. We have a designated team specialized in the treatment of Heart Failure. This Care Team includes your primary Heart Failure Specialized Cardiologist (physician), Advanced Practice Providers (APPs- Physician Assistants and Nurse Practitioners), and Pharmacist who all work together to provide you with the care you need, when you need it.   You may see any of the following providers on your designated Care Team at your next follow up:  Dr. Jules Oar Dr. Peder Bourdon Dr. Alwin Baars Dr. Judyth Nunnery Shawnee Dellen, FNP Bevely Brush, RPH-CPP  Please be sure to bring in all your medications bottles to every appointment.   Need to Contact Us :  If you have any questions or concerns before your next appointment please send us  a message through [employer] or call our office at [phone].    TO LEAVE A MESSAGE FOR THE NURSE SELECT OPTION 2, PLEASE LEAVE A MESSAGE INCLUDING: YOUR NAME DATE OF BIRTH CALL BACK NUMBER REASON FOR CALL**this is important as we prioritize the call backs  YOU WILL RECEIVE A CALL BACK THE SAME DAY  AS LONG AS YOU CALL BEFORE 4:00 PM

## 2023-12-02 NOTE — Progress Notes (Signed)
 PCP: Viola Greulich, MD Cardiology: Dr. Jerelene Monday HF Cardiology: Dr. Mitzie Anda  Chief complaint: CHF  63 y.o. with history of DM2, HTN, and chronic systolic CHF/nonischemic cardiomyopathy was referred by Dr. Gollan for evaluation of CHF.  Patient noted bradycardia on his Apple Watch last fall.  No lightheadedness/syncope.  His metoprolol  (on for HTN) was decreased and he was referred for an echo in 11/23, which showed EF 35-40%, mild LV dilation, normal RV.  Subsequent coronary CTA showed minimal CAD.  He was admitted in 2/24 to [facility] with presyncopal event that was thought to be vagally-mediated.  He has had no episodes of syncope/presyncope since that time. He does not have a strong FH of CHF, father had "heart failure" along with other chronic illnesses when he was of advanced age.  No h/o CHF or cardiomyopathy among his siblings.    Cardiac MRI in 7/24 showed mild LV dilation with EF 30%, diffuse hypokinesis, RV EF 34%, mid-wall LGE in the basal to mid inferoseptal wall.  Cardiac PET in 10/24 showed EF 39%, no coronary calcium , no FDG uptake so no evidence for active inflammation.   Echo in 2/25 showed EF 35-40%, diffuse hypokinesis, mild RV dysfunction, technically difficult study.   Patient returns for followup of CHF.  He has been doing well symptomatically.  No exertional dyspnea or chest pain.  He is quite active, plays golf and does to the gym where he uses the elliptical, treadmill, and rowing machine. No lightheadedness. Weight up 2 lbs. BP controlled. No orthopnea, PND, palpitations.   Labs (3/24): K 4.3, creatinine 0.89 Labs (4/24): K 4.3, creatinine 0.85 Labs (8/24): K 4.6, creatinine 0.88, ACE level normal Labs (11/24): K 4.2, creatinine 0.96 Labs (12/24): LDL 67 Labs (2/25): BNP 52 Labs (3/25): K 4.5, creatinine 0.91  ECG (personally reviewed): NSR, LVH QRS 126 msec  PMH: 1. Type 2 diabetes 2. HTN 3. Nephrolithiasis 4. H/o bradycardia 5. Chronic systolic CHF: Nonischemic  cardiomyopathy. Coronary CTA in 12/23 showed minimal CAD (calcium  score 3.42, 29th percentile).  Echo (11/23) with EF 35-40%, mild LV dilation, normal RV.  - Cardiac MRI (7/24): Mild LV dilation with EF 30%, diffuse hypokinesis, RV EF 34%, mid-wall LGE in the basal to mid inferoseptal wall.  - Cardiac PET (10/24): EF 39%, no coronary calcium , no FDG uptake so no evidence for active inflammation.  - Echo (2/25): EF 35-40%, diffuse hypokinesis, mild RV dysfunction, technically difficult study.  6. Sleep study negative  FH: Father with CHF in 33s, mother with PPM.  No sudden death.  Siblings with no CHF/cardiomyopathy.   Social History   Socioeconomic History   Marital status: Married    Spouse name: Not on file   Number of children: 2   Years of education: Not on file   Highest education level: Not on file  Occupational History   Occupation: Retired supply Scientist, product/process development  Tobacco Use   Smoking status: Never   Smokeless tobacco: Never  Vaping Use   Vaping status: Never Used  Substance and Sexual Activity   Alcohol use: Yes    Alcohol/week: 0.0 standard drinks of alcohol    Comment: occasionally   Drug use: No   Sexual activity: Not on file  Other Topics Concern   Not on file  Social History Narrative   Not on file   Social Drivers of Health   Financial Resource Strain: Not on file  Food Insecurity: Not on file  Transportation Needs: Not on file  Physical Activity: Not  on file  Stress: Not on file  Social Connections: Not on file  Intimate Partner Violence: Not on file   ROS: All systems reviewed and negative except as per HPI.   Current Outpatient Medications  Medication Sig Dispense Refill   alfuzosin  (UROXATRAL ) 10 MG 24 hr tablet Take 10 mg by mouth daily.     aspirin  EC 81 MG tablet Take 81 mg by mouth daily.     atorvastatin  (LIPITOR) 40 MG tablet TAKE 1 TABLET BY MOUTH EVERY DAY 30 tablet 8   betamethasone dipropionate 0.05 % lotion Apply 1 Application topically  2 (two) times daily. (Apply thin layer to scalp and face)     cholecalciferol (VITAMIN D3) 25 MCG (1000 UNIT) tablet Take 1,000 Units by mouth daily.     Elderberry 500 MG CAPS Take 1 capsule by mouth daily.     Faricimab-svoa (VABYSMO IZ) by Intravitreal route every 30 (thirty) days.     isosorbide-hydrALAZINE  (BIDIL) 20-37.5 MG tablet Take 2 tablets by mouth 3 (three) times daily. 300 tablet 5   JARDIANCE  25 MG TABS tablet TAKE 1 TABLET (25 MG TOTAL) BY MOUTH DAILY. 90 tablet 3   ketoconazole (NIZORAL) 2 % shampoo Apply 1 Application topically 3 (three) times a week.     latanoprost  (XALATAN ) 0.005 % ophthalmic solution Place 1 drop into both eyes every evening.     Multiple Vitamin (MULTIVITAMIN ADULT PO) Take 1 tablet by mouth daily.     Multiple Vitamins-Minerals (PRESERVISION AREDS 2) CAPS Take 1 capsule by mouth in the morning and at bedtime.     pantoprazole  (PROTONIX ) 20 MG tablet Take 20 mg by mouth daily as needed for heartburn.     sacubitril -valsartan  (ENTRESTO ) 97-103 MG TAKE 1 TABLET BY MOUTH TWICE A DAY 60 tablet 11   spironolactone  (ALDACTONE ) 25 MG tablet TAKE 1 TABLET (25 MG TOTAL) BY MOUTH DAILY. DUE FOR FOLLOW UP VISIT. 90 tablet 0   timolol  (TIMOPTIC ) 0.5 % ophthalmic solution Place 1 drop into both eyes daily.     metoprolol  succinate (TOPROL -XL) 50 MG 24 hr tablet Take 1 tablet (50 mg total) by mouth daily. TAKE WITH OR IMMEDIATELY FOLLOWING A MEAL. 90 tablet 3   No current facility-administered medications for this visit.   BP 121/65   Pulse 63   Wt 236 lb (107 kg)   SpO2 98%   BMI 36.96 kg/m  General: NAD Neck: No JVD, no thyromegaly or thyroid  nodule.  Lungs: Clear to auscultation bilaterally with normal respiratory effort. CV: Nondisplaced PMI.  Heart regular S1/S2, no S3/S4, no murmur.  No peripheral edema.  No carotid bruit.  Normal pedal pulses.  Abdomen: Soft, nontender, no hepatosplenomegaly, no distention.  Skin: Intact without lesions or rashes.   Neurologic: Alert and oriented x 3.  Psych: Normal affect. Extremities: No clubbing or cyanosis.  HEENT: Normal.   Assessment/Plan: 1. Chronic systolic CHF:  Nonischemic cardiomyopathy.  Coronary CTA in 12/23 showed minimal CAD (calcium  score 3.42, 29th percentile).  Echo (11/23) with EF 35-40%, mild LV dilation, normal RV.  Cause of cardiomyopathy is uncertain.  He does not have a strong family history.  No h/o drug or ETOH abuse.  HTN could play a role though BP has been controlled.  Cardiac MRI in 7/24 showed mild LV dilation with EF 30%, diffuse hypokinesis, RV EF 34%, mid-wall LGE in the basal to mid inferoseptal wall.  Possible prior viral myocarditis, cardiomyopathy was diagnosed after a COVID episode.  Mid-wall LGE also could  be seen with cardiac sarcoidosis though CT chest in 12/23 showed no signs of pulmonary sarcoidosis. Cardiac PET in 10/24 showed EF 39%, no coronary calcium , no FDG uptake so no evidence for active inflammation, not suggestive of cardiac sarcoidosis. Echo in 2/25 showed EF 35-40%, diffuse hypokinesis, mild RV dysfunction, technically difficult study.    NYHA class I, not volume overloaded on exam.  - Continue Entresto  97/103 bid. BMET/BNP today.  - Continue Jardiance  - Continue spironolactone  25 daily.  - Increase Toprol  XL to 50 mg daily.  - Continue Bidil 2 tabs tid  - Last echo was technically difficult, I will arrange for cardiac MRI in 8/25 to reassess LV function with treatment.   2. Sinus bradycardia: HR in 60s currently.  He had a presyncopal episode with HR in 30s-40s in 2/24, thought to be vagal.  No further lightheadedness.   - OK to continue Toprol  XL and carefully increase.    Followup in 4 months   I spent 31 minutes reviewing records, interviewing/examining patient, and managing orders.     Peder Bourdon 12/02/2023

## 2023-12-03 ENCOUNTER — Ambulatory Visit (HOSPITAL_COMMUNITY): Payer: Self-pay | Admitting: Cardiology

## 2023-12-03 LAB — BASIC METABOLIC PANEL WITH GFR
BUN/Creatinine Ratio: 28 — ABNORMAL HIGH (ref 10–24)
BUN: 24 mg/dL (ref 8–27)
CO2: 18 mmol/L — ABNORMAL LOW (ref 20–29)
Calcium: 10.3 mg/dL — ABNORMAL HIGH (ref 8.6–10.2)
Chloride: 103 mmol/L (ref 96–106)
Creatinine, Ser: 0.87 mg/dL (ref 0.76–1.27)
Glucose: 99 mg/dL (ref 70–99)
Potassium: 4.8 mmol/L (ref 3.5–5.2)
Sodium: 140 mmol/L (ref 134–144)
eGFR: 97 mL/min/{1.73_m2} (ref >59)

## 2023-12-03 LAB — BRAIN NATRIURETIC PEPTIDE: BNP: 31.8 pg/mL (ref 0.0–100.0)

## 2023-12-11 ENCOUNTER — Encounter: Payer: Self-pay | Admitting: Family Medicine

## 2023-12-11 ENCOUNTER — Ambulatory Visit (INDEPENDENT_AMBULATORY_CARE_PROVIDER_SITE_OTHER): Admitting: Family Medicine

## 2023-12-11 VITALS — BP 142/82 | HR 68 | Temp 98.5°F | Ht 67.0 in | Wt 237.0 lb

## 2023-12-11 DIAGNOSIS — I10 Essential (primary) hypertension: Secondary | ICD-10-CM

## 2023-12-11 DIAGNOSIS — E1169 Type 2 diabetes mellitus with other specified complication: Secondary | ICD-10-CM

## 2023-12-11 DIAGNOSIS — Z0000 Encounter for general adult medical examination without abnormal findings: Secondary | ICD-10-CM | POA: Diagnosis not present

## 2023-12-11 DIAGNOSIS — Z125 Encounter for screening for malignant neoplasm of prostate: Secondary | ICD-10-CM

## 2023-12-11 DIAGNOSIS — H353210 Exudative age-related macular degeneration, right eye, stage unspecified: Secondary | ICD-10-CM | POA: Diagnosis not present

## 2023-12-11 DIAGNOSIS — E782 Mixed hyperlipidemia: Secondary | ICD-10-CM | POA: Diagnosis not present

## 2023-12-11 LAB — CBC WITH DIFFERENTIAL/PLATELET
Basophils Absolute: 0 10*3/uL (ref 0.0–0.1)
Basophils Relative: 0.4 % (ref 0.0–3.0)
Eosinophils Absolute: 0 10*3/uL (ref 0.0–0.7)
Eosinophils Relative: 1 % (ref 0.0–5.0)
HCT: 42.6 % (ref 39.0–52.0)
Hemoglobin: 14.6 g/dL (ref 13.0–17.0)
Lymphocytes Relative: 34.1 % (ref 12.0–46.0)
Lymphs Abs: 1.5 10*3/uL (ref 0.7–4.0)
MCHC: 34.3 g/dL (ref 30.0–36.0)
MCV: 90.5 fl (ref 78.0–100.0)
Monocytes Absolute: 0.4 10*3/uL (ref 0.1–1.0)
Monocytes Relative: 8.5 % (ref 3.0–12.0)
Neutro Abs: 2.5 10*3/uL (ref 1.4–7.7)
Neutrophils Relative %: 56 % (ref 43.0–77.0)
Platelets: 154 10*3/uL (ref 150.0–400.0)
RBC: 4.71 Mil/uL (ref 4.22–5.81)
RDW: 13.6 % (ref 11.5–15.5)
WBC: 4.5 10*3/uL (ref 4.0–10.5)

## 2023-12-11 NOTE — Patient Instructions (Signed)
 The sildenafil  was not refilled as it should not be taken with isosorbide-hydralazine  (BiDil).

## 2023-12-11 NOTE — Progress Notes (Signed)
 Established Patient Office Visit   Subjective  Patient ID: Robert Mcguire, male    DOB: 04-30-1961  Age: 63 y.o. MRN: [id number]  Chief Complaint  Patient presents with   Annual Exam    Patient is a 63 year old male seen for CPE.Patient doing well overall.  Had recent labs with weight management and cardiology.  Patient mentions that he was recently diagnosed with age-related macular degeneration in right eye.  Received an injection that made eye worse.  Started a different injection 1 month ago.  Denies decreased vision or pain in eye.  Patient requesting refill on Viagra .    Patient Active Problem List   Diagnosis Date Noted   Microalbuminuria 07/23/2023   Congestive heart failure (HCC) 05/07/2023   Coronary artery disease 09/16/2022   Elevated troponin 08/21/2022   NICM (nonischemic cardiomyopathy) (HCC) 08/21/2022   Gastroenteritis 08/21/2022   Nausea and vomiting 08/21/2022   Diarrhea 08/21/2022   Vasovagal episode 08/21/2022   BMI 36.0-36.9,adult 08/19/2022   Obesity, Beginning BMI 36.34 08/19/2022   Bradycardia 04/08/2022   Elevated liver enzymes 01/30/2022   Other fatigue 01/28/2022   SOB (shortness of breath) on exertion 01/28/2022   Diabetes mellitus (HCC) 01/28/2022   Mixed hyperlipidemia 01/28/2022   Health care maintenance 01/28/2022   Vitamin D  deficiency 01/28/2022   Class 2 severe obesity with serious comorbidity and body mass index (BMI) of 36.0 to 36.9 in adult [facility]) 01/28/2022   Type 2 diabetes mellitus without complication, without long-term current use of insulin  (HCC) 08/20/2016   Hx of colonic polyps 09/30/2011   GYNECOMASTIA 01/15/2010   NEPHROLITHIASIS, HX OF 05/04/2008   SHOULDER PAIN, LEFT 11/08/2007   Obesity 01/22/2007   ERECTILE DYSFUNCTION 01/22/2007   Essential hypertension 01/22/2007   ALLERGIC RHINITIS, SEASONAL 01/22/2007   GERD 01/22/2007   HYPOKALEMIA, HX OF 01/22/2007   Past Medical History:  Diagnosis Date   Allergic rhinitis     Allergy    Arthritis    CHF (congestive heart failure) (HCC)    Diabetes mellitus without complication (HCC)    ED (erectile dysfunction)    GERD (gastroesophageal reflux disease)    Glaucoma    High cholesterol    Hyperlipidemia    Hypertension    Impaired glucose tolerance    Nephrolithiasis    Obesity    Past Surgical History:  Procedure Laterality Date   COLONOSCOPY     difficultity with intabation     GYNECOMASTIA EXCISION Bilateral    POLYPECTOMY     right achilles tendon surgery     Social History   Tobacco Use   Smoking status: Never   Smokeless tobacco: Never  Vaping Use   Vaping status: Never Used  Substance Use Topics   Alcohol use: Yes    Alcohol/week: 0.0 standard drinks of alcohol    Comment: occasionally   Drug use: No   Family History  Problem Relation Age of Onset   Hypertension Mother    Heart disease Mother        Pacemaker   Obesity Father    Diabetes Father    Hypertension Father    Colon cancer Father 17   Colon polyps Father    High Cholesterol Father    Cancer Father    Healthy Sister    Hypertension Brother    Colon polyps Brother    Hypertension Brother    Hypertension Maternal Grandmother    Diabetes Other    Hypertension Other    Breast cancer Other  breast, colon    Stomach cancer Other        paternal great grand father   Esophageal cancer Neg Hx    Rectal cancer Neg Hx    Liver disease Neg Hx    No Known Allergies  ROS Negative unless stated above    Objective:      BP (!) 142/82 (BP Location: Left Arm, Patient Position: Sitting, Cuff Size: Normal)   Pulse 68   Temp 98.5 F (36.9 C) (Oral)   Ht 5\' 7"  (1.702 m)   Wt 237 lb (107.5 kg)   SpO2 98%   BMI 37.12 kg/m  BP Readings from Last 3 Encounters:  12/11/23 (!) 142/82  12/02/23 121/65  11/18/23 137/72   Wt Readings from Last 3 Encounters:  12/11/23 237 lb (107.5 kg)  12/02/23 236 lb (107 kg)  11/18/23 234 lb (106.1 kg)      Physical  Exam Constitutional:      Appearance: Normal appearance.  HENT:     Head: Normocephalic and atraumatic.     Right Ear: Tympanic membrane, ear canal and external ear normal.     Left Ear: Tympanic membrane, ear canal and external ear normal.     Nose: Nose normal.     Mouth/Throat:     Mouth: Mucous membranes are moist.     Pharynx: No oropharyngeal exudate or posterior oropharyngeal erythema.  Eyes:     General: No scleral icterus.    Extraocular Movements: Extraocular movements intact.     Conjunctiva/sclera: Conjunctivae normal.     Pupils: Pupils are equal, round, and reactive to light.  Neck:     Thyroid : No thyromegaly.  Cardiovascular:     Rate and Rhythm: Normal rate and regular rhythm.     Pulses: Normal pulses.     Heart sounds: Normal heart sounds. No murmur heard.    No friction rub.  Pulmonary:     Effort: Pulmonary effort is normal.     Breath sounds: Normal breath sounds. No wheezing, rhonchi or rales.  Abdominal:     General: Bowel sounds are normal.     Palpations: Abdomen is soft.     Tenderness: There is no abdominal tenderness.  Musculoskeletal:        General: No deformity. Normal range of motion.  Lymphadenopathy:     Cervical: No cervical adenopathy.  Skin:    General: Skin is warm and dry.     Findings: No lesion.     Comments: Dry hyperpigmented skin on bilateral lateral arms at epicondyles and forearms.  Neurological:     General: No focal deficit present.     Mental Status: He is alert and oriented to person, place, and time.  Psychiatric:        Mood and Affect: Mood normal.        Thought Content: Thought content normal.        12/11/2023    2:46 PM 10/17/2022    8:12 AM 01/28/2022    9:05 AM  Depression screen PHQ 2/9  Decreased Interest 0 0 1  Down, Depressed, Hopeless 0 0 0  PHQ - 2 Score 0 0 1  Altered sleeping 0  0  Tired, decreased energy 0  1  Change in appetite 0  1  Feeling bad or failure about yourself  0  0  Trouble  concentrating 0  0  Moving slowly or fidgety/restless 0  0  Suicidal thoughts 0  0  PHQ-9 Score 0  3  Difficult doing work/chores Not difficult at all  Not difficult at all      12/11/2023    2:46 PM  GAD 7 : Generalized Anxiety Score  Nervous, Anxious, on Edge 0  Control/stop worrying 0  Worry too much - different things 0  Trouble relaxing 0  Restless 0  Easily annoyed or irritable 1  Afraid - awful might happen 0  Total GAD 7 Score 1  Anxiety Difficulty Not difficult at all   Diabetic Foot Exam - Simple   Simple Foot Form Diabetic Foot exam was performed with the following findings: Yes 12/11/2023  2:42 PM  Visual Inspection No deformities, no ulcerations, no other skin breakdown bilaterally: Yes Sensation Testing Intact to touch and monofilament testing bilaterally: Yes Pulse Check Posterior Tibialis and Dorsalis pulse intact bilaterally: Yes Comments       No results found for any visits on 12/11/23.    Assessment & Plan:   Well adult exam -     CBC with Differential/Platelet; Future -     TSH; Future -     T4, free; Future  Essential hypertension -     CBC with Differential/Platelet; Future -     TSH; Future -     T4, free; Future  Type 2 diabetes mellitus with other specified complication, without long-term current use of insulin  (HCC)  Screening for prostate cancer -     PSA; Future  Mixed hyperlipidemia  Exudative age-related macular degeneration of right eye, unspecified stage [facility])  Age propria health screenings discussed.  Will obtain labs not previously done in the last few months.  Immunizations reviewed.  Consider shingles vaccine.  Diabetes well-controlled.  Last hemoglobin A1c 5.4% on 09/22/2023.  Continue current medications.  Advised Viagra  should not be taken with isosorbide-hydralazine  (BiDil), mMedication was not refilled.  Return in about 6 months (around 06/11/2024), or if symptoms worsen or fail to improve.   Viola Greulich, MD

## 2023-12-15 ENCOUNTER — Other Ambulatory Visit: Payer: Self-pay | Admitting: Cardiovascular Disease

## 2023-12-15 NOTE — Telephone Encounter (Signed)
This is a CHF pt, Dr. Shirlee Latch

## 2023-12-17 ENCOUNTER — Encounter: Payer: Self-pay | Admitting: Family Medicine

## 2023-12-17 LAB — T4, FREE: Free T4: 1.01 ng/dL (ref 0.60–1.60)

## 2023-12-17 LAB — PSA: PSA: 1.63 ng/mL (ref 0.10–4.00)

## 2023-12-17 LAB — TSH: TSH: 1.22 u[IU]/mL (ref 0.35–5.50)

## 2023-12-17 NOTE — Telephone Encounter (Signed)
 Possibly due to a delay from the lab.  Will make patient aware of results when available.

## 2023-12-21 ENCOUNTER — Ambulatory Visit: Payer: Self-pay | Admitting: Family Medicine

## 2023-12-29 ENCOUNTER — Encounter: Payer: Self-pay | Admitting: Gastroenterology

## 2024-01-04 ENCOUNTER — Telehealth (HOSPITAL_COMMUNITY): Payer: Self-pay

## 2024-01-05 DIAGNOSIS — H353211 Exudative age-related macular degeneration, right eye, with active choroidal neovascularization: Secondary | ICD-10-CM | POA: Diagnosis not present

## 2024-01-14 ENCOUNTER — Ambulatory Visit (INDEPENDENT_AMBULATORY_CARE_PROVIDER_SITE_OTHER): Admitting: Family Medicine

## 2024-01-27 ENCOUNTER — Encounter (HOSPITAL_COMMUNITY): Payer: Self-pay

## 2024-01-29 ENCOUNTER — Telehealth: Payer: Self-pay | Admitting: Cardiology

## 2024-01-29 ENCOUNTER — Telehealth: Payer: Self-pay

## 2024-01-29 ENCOUNTER — Encounter: Payer: Self-pay | Admitting: Cardiology

## 2024-01-29 MED ORDER — DIAZEPAM 2 MG PO TABS: 2.0000 mg | ORAL_TABLET | Freq: Once | ORAL | 0 refills | Status: AC

## 2024-02-01 ENCOUNTER — Ambulatory Visit (HOSPITAL_COMMUNITY): Payer: Self-pay | Admitting: Cardiology

## 2024-02-01 ENCOUNTER — Ambulatory Visit
Admission: RE | Admit: 2024-02-01 | Discharge: 2024-02-01 | Disposition: A | Discharge status: Home / Self Care | Source: Ambulatory Visit | Attending: Cardiology | Admitting: Cardiology

## 2024-02-01 ENCOUNTER — Other Ambulatory Visit: Payer: Self-pay | Admitting: Cardiology

## 2024-02-01 DIAGNOSIS — I5022 Chronic systolic (congestive) heart failure: Secondary | ICD-10-CM | POA: Insufficient documentation

## 2024-02-01 MED ORDER — GADOBUTROL 1 MMOL/ML IV SOLN
13.0000 mL | Freq: Once | INTRAVENOUS | Status: AC | PRN
  Administered 2024-02-01: 13 mL via INTRAVENOUS

## 2024-02-02 NOTE — Telephone Encounter (Signed)
 Pt's wife picked up paper script 01/29/24

## 2024-02-04 NOTE — Telephone Encounter (Signed)
 Discussed results with pt. Pt verbalized understanding and agreeable to further conversation about genetics testing at next appt.

## 2024-02-04 NOTE — Telephone Encounter (Signed)
-----   Message from Ezra Shuck sent at 02/01/2024  1:55 PM EDT ----- EF improved at 44%.  He has a noncoronary delayed enhancement pattern, would like to get genetic testing for hereditary cardiomyopathies when I see him in the office again.  ----- Message ----- From: Interface, Rad Results In Sent: 02/01/2024   1:47 PM EDT To: Ezra GORMAN Shuck, MD

## 2024-02-09 DIAGNOSIS — H353211 Exudative age-related macular degeneration, right eye, with active choroidal neovascularization: Secondary | ICD-10-CM | POA: Diagnosis not present

## 2024-02-22 ENCOUNTER — Telehealth: Admitting: Family Medicine

## 2024-02-22 DIAGNOSIS — U071 COVID-19: Secondary | ICD-10-CM | POA: Diagnosis not present

## 2024-02-22 MED ORDER — MOLNUPIRAVIR EUA 200MG CAPSULE: 4.0000 | ORAL_CAPSULE | Freq: Two times a day (BID) | ORAL | 0 refills | Status: AC

## 2024-02-22 NOTE — Progress Notes (Signed)
 Virtual Visit Consent   Robert Mcguire, you are scheduled for a virtual visit with a [facility] provider today. Just as with appointments in the office, your consent must be obtained to participate. Your consent will be active for this visit and any virtual visit you may have with one of our providers in the next 365 days. If you have a MyChart account, a copy of this consent can be sent to you electronically.  As this is a virtual visit, video technology does not allow for your provider to perform a traditional examination. This may limit your provider's ability to fully assess your condition. If your provider identifies any concerns that need to be evaluated in person or the need to arrange testing (such as labs, EKG, etc.), we will make arrangements to do so. Although advances in technology are sophisticated, we cannot ensure that it will always work on either your end or our end. If the connection with a video visit is poor, the visit may have to be switched to a telephone visit. With either a video or telephone visit, we are not always able to ensure that we have a secure connection.  By engaging in this virtual visit, you consent to the provision of healthcare and authorize for your insurance to be billed (if applicable) for the services provided during this visit. Depending on your insurance coverage, you may receive a charge related to this service.  I need to obtain your verbal consent now. Are you willing to proceed with your visit today? Robert Mcguire has provided verbal consent on 02/22/2024 for a virtual visit (video or telephone). Robert DELENA Darby, FNP  Date: 02/22/2024 11:16 AM   Virtual Visit via Video Note   I, Robert Mcguire, connected with  Robert Mcguire  ([id number], 1960-09-11) on 02/22/24 at 11:00 AM EDT by a video-enabled telemedicine application and verified that I am speaking with the correct person using two identifiers.  Location: Patient: Virtual Visit Location Patient:  Home Provider: Virtual Visit Location Provider: Home Office   I discussed the limitations of evaluation and management by telemedicine and the availability of in person appointments. The patient expressed understanding and agreed to proceed.    History of Present Illness: Robert Mcguire is a 62 y.o. male who is being seen today for a positive home COVID test. He reports that symptoms started on Saturday evening and then more pronounced on March 31, 2024.  Symptoms include: Sneezing, cough, chills, sore throat, body aches. Taste is more bland than normal.  Denies shortness of breath, not coughing any thing up.  Denies N/V/D. Temp around 99 after Tylenol . Taking: Tylenol  and plain Mucinex Had COVID previously a couple of years ago.   HPI:  Problems:  Patient Active Problem List   Diagnosis Date Noted   Exudative age-related macular degeneration of right eye (HCC) 12/11/2023   Microalbuminuria 07/23/2023   Congestive heart failure (HCC) 05/07/2023   Coronary artery disease 09/16/2022   Elevated troponin 08/21/2022   NICM (nonischemic cardiomyopathy) (HCC) 08/21/2022   Gastroenteritis 08/21/2022   Nausea and vomiting 08/21/2022   Diarrhea 08/21/2022   Vasovagal episode 08/21/2022   BMI 36.0-36.9,adult 08/19/2022   Obesity, Beginning BMI 36.34 08/19/2022   Bradycardia 04/08/2022   Elevated liver enzymes 01/30/2022   Other fatigue 01/28/2022   SOB (shortness of breath) on exertion 01/28/2022   Diabetes mellitus (HCC) 01/28/2022   Mixed hyperlipidemia 01/28/2022   Health care maintenance 01/28/2022   Vitamin D  deficiency 01/28/2022   Class  2 severe obesity with serious comorbidity and body mass index (BMI) of 36.0 to 36.9 in adult [facility]) 01/28/2022   Type 2 diabetes mellitus without complication, without long-term current use of insulin  (HCC) 08/20/2016   Hx of colonic polyps 09/30/2011   GYNECOMASTIA 01/15/2010   NEPHROLITHIASIS, HX OF 05/04/2008   SHOULDER PAIN, LEFT 11/08/2007   Obesity  01/22/2007   ERECTILE DYSFUNCTION 01/22/2007   Essential hypertension 01/22/2007   ALLERGIC RHINITIS, SEASONAL 01/22/2007   GERD 01/22/2007   HYPOKALEMIA, HX OF 01/22/2007    Allergies: No Known Allergies Medications:  Current Outpatient Medications:    molnupiravir  EUA (LAGEVRIO ) 200 mg CAPS capsule, Take 4 capsules (800 mg total) by mouth 2 (two) times daily for 5 days., Disp: 40 capsule, Rfl: 0   alfuzosin  (UROXATRAL ) 10 MG 24 hr tablet, Take 10 mg by mouth daily., Disp: , Rfl:    aspirin  EC 81 MG tablet, Take 81 mg by mouth daily., Disp: , Rfl:    atorvastatin  (LIPITOR) 40 MG tablet, TAKE 1 TABLET BY MOUTH EVERY DAY, Disp: 30 tablet, Rfl: 8   betamethasone dipropionate 0.05 % lotion, Apply 1 Application topically 2 (two) times daily. (Apply thin layer to scalp and face), Disp: , Rfl:    cholecalciferol (VITAMIN D3) 25 MCG (1000 UNIT) tablet, Take 1,000 Units by mouth daily., Disp: , Rfl:    Elderberry 500 MG CAPS, Take 1 capsule by mouth daily., Disp: , Rfl:    Faricimab-svoa (VABYSMO IZ), by Intravitreal route every 30 (thirty) days., Disp: , Rfl:    isosorbide-hydrALAZINE  (BIDIL) 20-37.5 MG tablet, Take 2 tablets by mouth 3 (three) times daily., Disp: 300 tablet, Rfl: 5   JARDIANCE  25 MG TABS tablet, TAKE 1 TABLET (25 MG TOTAL) BY MOUTH DAILY., Disp: 90 tablet, Rfl: 3   ketoconazole (NIZORAL) 2 % shampoo, Apply 1 Application topically 3 (three) times a week., Disp: , Rfl:    latanoprost  (XALATAN ) 0.005 % ophthalmic solution, Place 1 drop into both eyes every evening., Disp: , Rfl:    metoprolol  succinate (TOPROL -XL) 50 MG 24 hr tablet, Take 1 tablet (50 mg total) by mouth daily. TAKE WITH OR IMMEDIATELY FOLLOWING A MEAL., Disp: 90 tablet, Rfl: 3   Multiple Vitamin (MULTIVITAMIN ADULT PO), Take 1 tablet by mouth daily., Disp: , Rfl:    Multiple Vitamins-Minerals (PRESERVISION AREDS 2) CAPS, Take 1 capsule by mouth in the morning and at bedtime., Disp: , Rfl:    pantoprazole  (PROTONIX )  20 MG tablet, Take 20 mg by mouth daily as needed for heartburn., Disp: , Rfl:    sacubitril -valsartan  (ENTRESTO ) 97-103 MG, TAKE 1 TABLET BY MOUTH TWICE A DAY, Disp: 60 tablet, Rfl: 11   spironolactone  (ALDACTONE ) 25 MG tablet, TAKE 1 TABLET (25 MG TOTAL) BY MOUTH DAILY. DUE FOR FOLLOW UP VISIT., Disp: 30 tablet, Rfl: 2   timolol  (TIMOPTIC ) 0.5 % ophthalmic solution, Place 1 drop into both eyes daily., Disp: , Rfl:   Observations/Objective: Patient is well-developed, well-nourished in no acute distress.  Resting comfortably  at home.  Head is normocephalic, atraumatic.  No labored breathing.  Speech is clear and coherent with logical content.  Patient is alert and oriented at baseline.   Assessment and Plan: 1. COVID (Primary)  Mild symptoms overall 3 days since onset of symptoms. Discussed antiviral therapy in first 5 days. He is high risk for COVID due to co-morbidities Medication interactions with Paxlovid , discussed alternative of Lagevrio  (no interactions noted on medication list when ran through Epic) Patient is undecided  if he would like to move forward with it at this time.  Requests that Rx is sent in and he will decided to fill it this afternoon.  Continue Mucinex and Tylenol  PRN Seek in person evaluation if you have worsening symptoms particularly shortness of breath. Declines work note  Follow Up Instructions: I discussed the assessment and treatment plan with the patient. The patient was provided an opportunity to ask questions and all were answered. The patient agreed with the plan and demonstrated an understanding of the instructions.  A copy of instructions were sent to the patient via MyChart unless otherwise noted below.   The patient was advised to call back or seek an in-person evaluation if the symptoms worsen or if the condition fails to improve as anticipated.    Robert DELENA Darby, FNP

## 2024-02-22 NOTE — Patient Instructions (Addendum)
 Robert Mcguire, thank you for joining Olam DELENA Darby, FNP for today's virtual visit.  While this provider is not your primary care provider (PCP), if your PCP is located in our provider database this encounter information will be shared with them immediately following your visit.   A [facility] MyChart account gives you access to today's visit and all your visits, tests, and labs performed at [facility]  click here if you don't have a [facility] MyChart account or go to [email or web address]  Consent: (Patient) Robert Mcguire provided verbal consent for this virtual visit at the beginning of the encounter.  Current Medications:  Current Outpatient Medications:    alfuzosin  (UROXATRAL ) 10 MG 24 hr tablet, Take 10 mg by mouth daily., Disp: , Rfl:    aspirin  EC 81 MG tablet, Take 81 mg by mouth daily., Disp: , Rfl:    atorvastatin  (LIPITOR) 40 MG tablet, TAKE 1 TABLET BY MOUTH EVERY DAY, Disp: 30 tablet, Rfl: 8   betamethasone dipropionate 0.05 % lotion, Apply 1 Application topically 2 (two) times daily. (Apply thin layer to scalp and face), Disp: , Rfl:    cholecalciferol (VITAMIN D3) 25 MCG (1000 UNIT) tablet, Take 1,000 Units by mouth daily., Disp: , Rfl:    Elderberry 500 MG CAPS, Take 1 capsule by mouth daily., Disp: , Rfl:    Faricimab-svoa (VABYSMO IZ), by Intravitreal route every 30 (thirty) days., Disp: , Rfl:    isosorbide-hydrALAZINE  (BIDIL) 20-37.5 MG tablet, Take 2 tablets by mouth 3 (three) times daily., Disp: 300 tablet, Rfl: 5   JARDIANCE  25 MG TABS tablet, TAKE 1 TABLET (25 MG TOTAL) BY MOUTH DAILY., Disp: 90 tablet, Rfl: 3   ketoconazole (NIZORAL) 2 % shampoo, Apply 1 Application topically 3 (three) times a week., Disp: , Rfl:    latanoprost  (XALATAN ) 0.005 % ophthalmic solution, Place 1 drop into both eyes every evening., Disp: , Rfl:    metoprolol  succinate (TOPROL -XL) 50 MG 24 hr tablet, Take 1 tablet (50 mg total) by mouth daily. TAKE WITH OR IMMEDIATELY  FOLLOWING A MEAL., Disp: 90 tablet, Rfl: 3   Multiple Vitamin (MULTIVITAMIN ADULT PO), Take 1 tablet by mouth daily., Disp: , Rfl:    Multiple Vitamins-Minerals (PRESERVISION AREDS 2) CAPS, Take 1 capsule by mouth in the morning and at bedtime., Disp: , Rfl:    pantoprazole  (PROTONIX ) 20 MG tablet, Take 20 mg by mouth daily as needed for heartburn., Disp: , Rfl:    sacubitril -valsartan  (ENTRESTO ) 97-103 MG, TAKE 1 TABLET BY MOUTH TWICE A DAY, Disp: 60 tablet, Rfl: 11   spironolactone  (ALDACTONE ) 25 MG tablet, TAKE 1 TABLET (25 MG TOTAL) BY MOUTH DAILY. DUE FOR FOLLOW UP VISIT., Disp: 30 tablet, Rfl: 2   timolol  (TIMOPTIC ) 0.5 % ophthalmic solution, Place 1 drop into both eyes daily., Disp: , Rfl:    Medications ordered in this encounter:  No orders of the defined types were placed in this encounter.    *If you need refills on other medications prior to your next appointment, please contact your pharmacy*  Follow-Up: Call back or seek an in-person evaluation if the symptoms worsen or if the condition fails to improve as anticipated.  [facility] [phone]  Other Instructions Prescription for antiviral sent to the pharmacy Continue using Tylenol  and plain Mucinex as needed. Discussed risk for developing severe COVID.  You should seek in person evaluation if you have any worsening symptoms particularly related to breathing and shortness of breath.  If you have been instructed to have an in-person evaluation today at a local Urgent Care facility, please use the link below. It will take you to a list of all of our available [facility], including address, phone number and hours of operation. Please do not delay care.  [facility]  If you or a family member do not have a primary care provider, use the link below to schedule a visit and establish care. When you choose a [facility] primary care physician or advanced practice provider, you gain a  long-term partner in health. Find a Primary Care Provider  Learn more about [facility] in-office and virtual care options: [facility] - Get Care Now

## 2024-03-08 DIAGNOSIS — H353211 Exudative age-related macular degeneration, right eye, with active choroidal neovascularization: Secondary | ICD-10-CM | POA: Diagnosis not present

## 2024-03-11 ENCOUNTER — Other Ambulatory Visit: Payer: Self-pay | Admitting: Cardiology

## 2024-03-21 ENCOUNTER — Telehealth: Payer: Self-pay | Admitting: Cardiology

## 2024-03-21 NOTE — Telephone Encounter (Signed)
 Called to confirm/remind patient of their appointment at the [facility] on 03/22/24.   Appointment:   [x] Confirmed  [] Left mess   [] No answer/No voice mail  [] VM Full/unable to leave message  [] Phone not in service  Patient reminded to bring all medications and/or complete list.  Confirmed patient has transportation. Gave directions, instructed to utilize valet parking.

## 2024-03-22 ENCOUNTER — Ambulatory Visit (HOSPITAL_COMMUNITY): Payer: Self-pay | Admitting: Cardiology

## 2024-03-22 ENCOUNTER — Other Ambulatory Visit: Payer: Self-pay | Admitting: Cardiology

## 2024-03-22 ENCOUNTER — Ambulatory Visit: Admitting: Cardiology

## 2024-03-22 ENCOUNTER — Other Ambulatory Visit
Admission: RE | Admit: 2024-03-22 | Discharge: 2024-03-22 | Disposition: A | Discharge status: Home / Self Care | Source: Ambulatory Visit | Attending: Cardiology | Admitting: Cardiology

## 2024-03-22 ENCOUNTER — Encounter: Payer: Self-pay | Admitting: Gastroenterology

## 2024-03-22 VITALS — BP 110/64 | HR 65 | Wt 237.0 lb

## 2024-03-22 DIAGNOSIS — I5022 Chronic systolic (congestive) heart failure: Secondary | ICD-10-CM | POA: Insufficient documentation

## 2024-03-22 DIAGNOSIS — I2510 Atherosclerotic heart disease of native coronary artery without angina pectoris: Secondary | ICD-10-CM | POA: Diagnosis not present

## 2024-03-22 DIAGNOSIS — E119 Type 2 diabetes mellitus without complications: Secondary | ICD-10-CM | POA: Insufficient documentation

## 2024-03-22 DIAGNOSIS — Z7984 Long term (current) use of oral hypoglycemic drugs: Secondary | ICD-10-CM | POA: Insufficient documentation

## 2024-03-22 DIAGNOSIS — R001 Bradycardia, unspecified: Secondary | ICD-10-CM | POA: Insufficient documentation

## 2024-03-22 DIAGNOSIS — I110 Hypertensive heart disease with heart failure: Secondary | ICD-10-CM | POA: Insufficient documentation

## 2024-03-22 DIAGNOSIS — I428 Other cardiomyopathies: Secondary | ICD-10-CM | POA: Insufficient documentation

## 2024-03-22 DIAGNOSIS — E785 Hyperlipidemia, unspecified: Secondary | ICD-10-CM | POA: Insufficient documentation

## 2024-03-22 DIAGNOSIS — Z79899 Other long term (current) drug therapy: Secondary | ICD-10-CM | POA: Diagnosis not present

## 2024-03-22 LAB — BRAIN NATRIURETIC PEPTIDE: B Natriuretic Peptide: 42.1 pg/mL (ref 0.0–100.0)

## 2024-03-22 LAB — BASIC METABOLIC PANEL WITH GFR
Anion gap: 10 (ref 5–15)
BUN: 20 mg/dL (ref 8–23)
CO2: 25 mmol/L (ref 22–32)
Calcium: 9.9 mg/dL (ref 8.9–10.3)
Chloride: 101 mmol/L (ref 98–111)
Creatinine, Ser: 0.86 mg/dL (ref 0.61–1.24)
GFR, Estimated: >60 mL/min (ref >60)
Glucose, Bld: 107 mg/dL — ABNORMAL HIGH (ref 70–99)
Potassium: 4.2 mmol/L (ref 3.5–5.1)
Sodium: 136 mmol/L (ref 135–145)

## 2024-03-22 LAB — LIPID PANEL
Cholesterol: 124 mg/dL (ref 0–200)
HDL: 32 mg/dL — ABNORMAL LOW (ref >40)
LDL Cholesterol: 77 mg/dL (ref 0–99)
Total CHOL/HDL Ratio: 3.9 ratio
Triglycerides: 76 mg/dL (ref <150)
VLDL: 15 mg/dL (ref 0–40)

## 2024-03-22 MED ORDER — METOPROLOL SUCCINATE ER 25 MG PO TB24: 75.0000 mg | ORAL_TABLET | Freq: Every day | ORAL | 11 refills | Status: AC

## 2024-03-22 NOTE — Progress Notes (Signed)
 cardiomyopathy genetic testing collected via buccal per Dr rolan.  Order form completed, signed and shipped with sample by FedEx to [facility].

## 2024-03-22 NOTE — Patient Instructions (Addendum)
 Medication Changes:  INCREASE METOPROLOL  TO 75 MG ONCE DAILY   Lab Work:  Go over to the [facility]ICAL [facility]ALL. Go pass the gift shop and have your blood work completed.  We will only call you if the results are abnormal or if the provider would like to make medication changes.  No news is good news.   Testing/Procedures:  Genetic testing has been collected, this has to be sent to [place]  for processing and can take 1-2 weeks for us  to get results back.  We will let you know the results once reviewed by your provider.   Follow up in 4 months with Dr. Rolan.  Our Doctors' schedules are NOT open yet for 4 months. We will place you on our recall list. Once they are available, we will call you to schedule your follow up appointment.   Thank you for choosing [facility].    At the [facility], you and your health needs are our priority. We have a designated team specialized in the treatment of Heart Failure. This Care Team includes your primary Heart Failure Specialized Cardiologist (physician), Advanced Practice Providers (APPs- Physician Assistants and Nurse Practitioners), and Pharmacist who all work together to provide you with the care you need, when you need it.   You may see any of the following providers on your designated Care Team at your next follow up:  Dr. Toribio Fuel Dr. Ezra Rolan Dr. Ria Commander Dr. Morene Brownie Ellouise Class, FNP Jaun Bash, RPH-CPP  Please be sure to bring in all your medications bottles to every appointment.   Need to Contact Us :  If you have any questions or concerns before your next appointment please send us  a message through [employer] or call our office at [phone].    TO LEAVE A MESSAGE FOR THE NURSE SELECT OPTION 2, PLEASE LEAVE A MESSAGE INCLUDING: YOUR NAME DATE OF BIRTH CALL BACK NUMBER REASON FOR CALL**this is important as we prioritize the call backs  YOU WILL  RECEIVE A CALL BACK THE SAME DAY AS LONG AS YOU CALL BEFORE 4:00 PM

## 2024-03-22 NOTE — Progress Notes (Signed)
 PCP: Mercer Clotilda SAUNDERS, MD Cardiology: Dr. Perla HF Cardiology: Dr. Rolan  Chief complaint: CHF  63 y.o. with history of DM2, HTN, and chronic systolic CHF/nonischemic cardiomyopathy was referred by Dr. Gollan for evaluation of CHF.  Patient noted bradycardia on his Apple Watch last fall.  No lightheadedness/syncope.  His metoprolol  (on for HTN) was decreased and he was referred for an echo in 11/23, which showed EF 35-40%, mild LV dilation, normal RV.  Subsequent coronary CTA showed minimal CAD.  He was admitted in 2/24 to [facility] with presyncopal event that was thought to be vagally-mediated.  He has had no episodes of syncope/presyncope since that time. He does not have a strong FH of CHF, father had heart failure along with other chronic illnesses when he was of advanced age.  No h/o CHF or cardiomyopathy among his siblings.    Cardiac MRI in 7/24 showed mild LV dilation with EF 30%, diffuse hypokinesis, RV EF 34%, mid-wall LGE in the basal to mid inferoseptal wall.  Cardiac PET in 10/24 showed EF 39%, no coronary calcium , no FDG uptake so no evidence for active inflammation.   Echo in 2/25 showed EF 35-40%, diffuse hypokinesis, mild RV dysfunction, technically difficult study.  Cardiac MRI was repeated in 7/25 due to technically difficult echo; this showed LV EF 44%, mild LV dilation, RV EF 48%, mid-wall LGE in the basal anteroseptal, basal-mid inferoseptal, basal inferior, and basal inferolateral walls with ECV 33%.    Patient returns for followup of CHF.  He continues to do well symptomatically.  Plays golf and goes to the gym, no exertional dyspnea or chest pain.  No lightheadedness.  No palpitations.  No orthopnea/PND.    Labs (3/24): K 4.3, creatinine 0.89 Labs (4/24): K 4.3, creatinine 0.85 Labs (8/24): K 4.6, creatinine 0.88, ACE level normal Labs (11/24): K 4.2, creatinine 0.96 Labs (12/24): LDL 67 Labs (2/25): BNP 52 Labs (3/25): K 4.5, creatinine 0.91 Labs (5/25): K 4.8,  creatinine 0.87, BNP 32 Labs (6/25): TSH normal  PMH: 1. Type 2 diabetes 2. HTN 3. Nephrolithiasis 4. H/o bradycardia 5. Chronic systolic CHF: Nonischemic cardiomyopathy. Coronary CTA in 12/23 showed minimal CAD (calcium  score 3.42, 29th percentile).  Echo (11/23) with EF 35-40%, mild LV dilation, normal RV.  - Cardiac MRI (7/24): Mild LV dilation with EF 30%, diffuse hypokinesis, RV EF 34%, mid-wall LGE in the basal to mid inferoseptal wall.  - Cardiac PET (10/24): EF 39%, no coronary calcium , no FDG uptake so no evidence for active inflammation.  - Echo (2/25): EF 35-40%, diffuse hypokinesis, mild RV dysfunction, technically difficult study.  - Cardiac MRI (7/25): LV EF 44%, mild LV dilation, RV EF 48%, mid-wall LGE in the basal anteroseptal, basal-mid inferoseptal, basal inferior, and basal inferolateral walls with ECV 33%.   6. Sleep study negative  FH: Father with CHF in 67s, mother with PPM.  No sudden death.  Siblings with no CHF/cardiomyopathy.   Social History   Socioeconomic History   Marital status: Married    Spouse name: Not on file   Number of children: 2   Years of education: Not on file   Highest education level: Not on file  Occupational History   Occupation: Retired supply Scientist, product/process development  Tobacco Use   Smoking status: Never   Smokeless tobacco: Never  Vaping Use   Vaping status: Never Used  Substance and Sexual Activity   Alcohol use: Yes    Alcohol/week: 0.0 standard drinks of alcohol    Comment: occasionally  Drug use: No   Sexual activity: Not on file  Other Topics Concern   Not on file  Social History Narrative   Not on file   Social Drivers of Health   Financial Resource Strain: Not on file  Food Insecurity: Not on file  Transportation Needs: Not on file  Physical Activity: Not on file  Stress: Not on file  Social Connections: Not on file  Intimate Partner Violence: Not on file   ROS: All systems reviewed and negative except as per HPI.    Current Outpatient Medications  Medication Sig Dispense Refill   alfuzosin  (UROXATRAL ) 10 MG 24 hr tablet Take 10 mg by mouth daily.     aspirin  EC 81 MG tablet Take 81 mg by mouth daily.     atorvastatin  (LIPITOR) 40 MG tablet TAKE 1 TABLET BY MOUTH EVERY DAY 30 tablet 8   betamethasone dipropionate 0.05 % lotion Apply 1 Application topically 2 (two) times daily. (Apply thin layer to scalp and face)     cholecalciferol (VITAMIN D3) 25 MCG (1000 UNIT) tablet Take 1,000 Units by mouth daily.     Elderberry 500 MG CAPS Take 1 capsule by mouth daily.     Faricimab-svoa (VABYSMO IZ) by Intravitreal route every 30 (thirty) days.     isosorbide-hydrALAZINE  (BIDIL) 20-37.5 MG tablet Take 2 tablets by mouth 3 (three) times daily. 300 tablet 5   JARDIANCE  25 MG TABS tablet TAKE 1 TABLET (25 MG TOTAL) BY MOUTH DAILY. 90 tablet 3   ketoconazole (NIZORAL) 2 % shampoo Apply 1 Application topically 3 (three) times a week.     latanoprost  (XALATAN ) 0.005 % ophthalmic solution Place 1 drop into both eyes every evening.     metoprolol  succinate (TOPROL -XL) 25 MG 24 hr tablet Take 3 tablets (75 mg total) by mouth daily. Take with or immediately following a meal. 90 tablet 11   Multiple Vitamin (MULTIVITAMIN ADULT PO) Take 1 tablet by mouth daily.     Multiple Vitamins-Minerals (PRESERVISION AREDS 2) CAPS Take 1 capsule by mouth in the morning and at bedtime.     pantoprazole  (PROTONIX ) 20 MG tablet Take 20 mg by mouth daily as needed for heartburn.     sacubitril -valsartan  (ENTRESTO ) 97-103 MG TAKE 1 TABLET BY MOUTH TWICE A DAY 60 tablet 11   spironolactone  (ALDACTONE ) 25 MG tablet Take 1 tablet (25 mg total) by mouth once for 1 dose. 30 tablet 2   timolol  (TIMOPTIC ) 0.5 % ophthalmic solution Place 1 drop into both eyes daily.     No current facility-administered medications for this visit.   BP 110/64   Pulse 65   Wt 237 lb (107.5 kg)   SpO2 100%   BMI 37.12 kg/m  General: NAD Neck: No JVD, no  thyromegaly or thyroid  nodule.  Lungs: Clear to auscultation bilaterally with normal respiratory effort. CV: Nondisplaced PMI.  Heart regular S1/S2, no S3/S4, no murmur.  No peripheral edema.  No carotid bruit.  Normal pedal pulses.  Abdomen: Soft, nontender, no hepatosplenomegaly, no distention.  Skin: Intact without lesions or rashes.  Neurologic: Alert and oriented x 3.  Psych: Normal affect. Extremities: No clubbing or cyanosis.  HEENT: Normal.   Assessment/Plan: 1. Chronic systolic CHF:  Nonischemic cardiomyopathy.  Coronary CTA in 12/23 showed minimal CAD (calcium  score 3.42, 29th percentile).  Echo (11/23) with EF 35-40%, mild LV dilation, normal RV.  Cause of cardiomyopathy is uncertain.  He does not have a strong family history.  No h/o drug or  ETOH abuse.  HTN could play a role though BP has been controlled.  Cardiac MRI in 7/24 showed mild LV dilation with EF 30%, diffuse hypokinesis, RV EF 34%, mid-wall LGE in the basal to mid inferoseptal wall.  Possible prior viral myocarditis, cardiomyopathy was diagnosed after a COVID episode.  Mid-wall LGE also could be seen with cardiac sarcoidosis.  CT chest in 12/23 showed no signs of pulmonary sarcoidosis. Cardiac PET in 10/24 showed EF 39%, no coronary calcium , no FDG uptake so no evidence for active inflammation, not suggestive of cardiac sarcoidosis. Echo in 2/25 showed EF 35-40%, diffuse hypokinesis, mild RV dysfunction, technically difficult study. Given difficult echo in 2/25, cardiac MRI was repeated in 7/25 showing LV EF 44%, mild LV dilation, RV EF 48%, mid-wall LGE in the basal anteroseptal, basal-mid inferoseptal, basal inferior, and basal inferolateral walls with ECV 33%.  With cardiac sarcoidosis unlikely based on prior cardiac PET, most likely diagnoses would be a familial arrhythmogenic cardiomyopathy versus prior viral myocarditis.   NYHA class I, not volume overloaded on exam.  - I will send [facility] testing to look for  common causes of familial cardiomyopathy.  We discussed implications of testing today and he agrees to test.  - Continue Entresto  97/103 bid. BMET/BNP today.  - Continue Jardiance  - Continue spironolactone  25 daily.  - Increase Toprol  XL to 75 mg daily.  - Continue Bidil 2 tabs tid  2. Sinus bradycardia: HR in 60s currently.  He had a presyncopal episode with HR in 30s-40s in 2/24, thought to be vagal.  No further lightheadedness.   - OK to continue Toprol  XL and carefully increase.   3. Hyperlipidemia: Check lipids today.   Followup in 4 months   I spent 32 minutes reviewing records, interviewing/examining patient, and managing orders.   Ezra Shuck 03/22/2024

## 2024-03-23 ENCOUNTER — Encounter: Payer: Self-pay | Admitting: Cardiovascular Disease

## 2024-03-23 ENCOUNTER — Ambulatory Visit: Attending: Cardiovascular Disease | Admitting: Cardiovascular Disease

## 2024-03-23 VITALS — BP 140/80 | HR 67 | Ht 67.0 in | Wt 238.4 lb

## 2024-03-23 DIAGNOSIS — Z6836 Body mass index (BMI) 36.0-36.9, adult: Secondary | ICD-10-CM

## 2024-03-23 DIAGNOSIS — I5042 Chronic combined systolic (congestive) and diastolic (congestive) heart failure: Secondary | ICD-10-CM | POA: Diagnosis not present

## 2024-03-23 DIAGNOSIS — E66812 Obesity, class 2: Secondary | ICD-10-CM | POA: Diagnosis not present

## 2024-03-23 DIAGNOSIS — E119 Type 2 diabetes mellitus without complications: Secondary | ICD-10-CM

## 2024-03-23 DIAGNOSIS — I10 Essential (primary) hypertension: Secondary | ICD-10-CM | POA: Diagnosis not present

## 2024-03-23 DIAGNOSIS — I428 Other cardiomyopathies: Secondary | ICD-10-CM | POA: Diagnosis not present

## 2024-03-23 DIAGNOSIS — I2510 Atherosclerotic heart disease of native coronary artery without angina pectoris: Secondary | ICD-10-CM | POA: Diagnosis not present

## 2024-03-23 NOTE — Progress Notes (Signed)
 Cardiology Office Note  Date:  03/23/2024   ID:  Robert Mcguire, DOB 1960-08-01, MRN [id number]  PCP:  Robert Clotilda JONELLE, MD   Chief Complaint  Patient presents with   Follow-up    Denies cardiac symptoms.    HPI:  [name] is a 63 year old gentleman with past medical history of  nonobstructive coronary artery disease by cardiac CTA December 2023,  ejection fraction 35 to 40% nonischemic cardiomyopathy,  Cardiac CTA with nonobstructive disease December 2023 Previously followed by cardiology in [place],  diabetes type 2,  hypertension,  hyperlipidemia   hospitalization at [facility] for non-STEMI nausea vomiting diarrhea, elevated troponin Nonischemic cardiomyopathy.  Possible viral myocarditis/cardiomyopathy was diagnosed after a COVID episode.  Coronary CTA in 12/23 showed minimal CAD (calcium  score 3.42, 29th percentile). Echo (11/23) with EF 35-40%, mild LV dilation, normal RV.  Who presents for follow-up of his cardiomyopathy  Last seen by myself in clinic March 2024 Retired, plays golf,  Goes to gym 3-4 x a week, weights, cardio Walks for exercise with his wife Denies significant shortness of breath on exertion No significant lower extremity edema, no PND orthopnea Has difficulty remembering BiDil and metoprolol  in the middle of the day, takes this at dinner, third dose before bed  Current medication list Entresto  97/103 bid. Jardiance  25 daily spironolactone  25 daily. Toprol  XL  75 mg daily.  Bidil 2 tabs tid   Followed by advanced heart failure clinic Cardiac imaging reviewed Echo November 2023 Left ventricular ejection fraction, by estimation, is 35 to 40%.   Cardiac MRI in 7/24 showed mild LV dilation with EF 30%, diffuse hypokinesis, RV EF 34%, mid-wall LGE in the basal to mid inferoseptal wall.    CT chest in 12/23 showed no signs of pulmonary sarcoidosis.   Cardiac PET in 10/24 showed EF 39%, no coronary calcium , no FDG uptake so no evidence for active  inflammation, not suggestive of cardiac sarcoidosis.   Echo in 2/25 showed EF 35-40%, diffuse hypokinesis, mild RV dysfunction, technically difficult study.   cardiac MRI  7/25 showing LV EF 44%, mild LV dilation, RV EF 48%, mid-wall LGE in the basal anteroseptal, basal-mid inferoseptal, basal inferior, and basal inferolateral walls with ECV 33%.   Other past medical history reviewed woke up August 21, 2022 at 1:30 AM to have a bowel movement.  vasovagal spell, exacerbated by medications and bradycardia   PMH:   has a past medical history of Allergic rhinitis, Allergy, Arthritis, CHF (congestive heart failure) (HCC), Diabetes mellitus without complication (HCC), ED (erectile dysfunction), GERD (gastroesophageal reflux disease), Glaucoma, High cholesterol, Hyperlipidemia, Hypertension, Impaired glucose tolerance, Nephrolithiasis, and Obesity.  PSH:    Past Surgical History:  Procedure Laterality Date   COLONOSCOPY     difficultity with intabation     GYNECOMASTIA EXCISION Bilateral    POLYPECTOMY     right achilles tendon surgery      Current Outpatient Medications  Medication Sig Dispense Refill   alfuzosin  (UROXATRAL ) 10 MG 24 hr tablet Take 10 mg by mouth daily.     aspirin  EC 81 MG tablet Take 81 mg by mouth daily.     atorvastatin  (LIPITOR) 40 MG tablet TAKE 1 TABLET BY MOUTH EVERY DAY 30 tablet 8   betamethasone dipropionate 0.05 % lotion Apply 1 Application topically 2 (two) times daily. (Apply thin layer to scalp and face)     cholecalciferol (VITAMIN D3) 25 MCG (1000 UNIT) tablet Take 1,000 Units by mouth daily.     Elderberry  500 MG CAPS Take 1 capsule by mouth daily.     Faricimab-svoa (VABYSMO IZ) by Intravitreal route every 30 (thirty) days.     isosorbide-hydrALAZINE  (BIDIL) 20-37.5 MG tablet Take 2 tablets by mouth 3 (three) times daily. 300 tablet 5   JARDIANCE  25 MG TABS tablet TAKE 1 TABLET (25 MG TOTAL) BY MOUTH DAILY. 90 tablet 3   ketoconazole (NIZORAL) 2 %  shampoo Apply 1 Application topically 3 (three) times a week.     latanoprost  (XALATAN ) 0.005 % ophthalmic solution Place 1 drop into both eyes every evening.     metoprolol  succinate (TOPROL -XL) 25 MG 24 hr tablet Take 3 tablets (75 mg total) by mouth daily. Take with or immediately following a meal. 90 tablet 11   Multiple Vitamin (MULTIVITAMIN ADULT PO) Take 1 tablet by mouth daily.     Multiple Vitamins-Minerals (PRESERVISION AREDS 2) CAPS Take 1 capsule by mouth in the morning and at bedtime.     pantoprazole  (PROTONIX ) 20 MG tablet Take 20 mg by mouth daily as needed for heartburn.     sacubitril -valsartan  (ENTRESTO ) 97-103 MG TAKE 1 TABLET BY MOUTH TWICE A DAY 60 tablet 11   spironolactone  (ALDACTONE ) 25 MG tablet Take 1 tablet (25 mg total) by mouth once for 1 dose. 30 tablet 2   timolol  (TIMOPTIC ) 0.5 % ophthalmic solution Place 1 drop into both eyes daily.     No current facility-administered medications for this visit.    Allergies:   Patient has no known allergies.   Social History:  The patient  reports that he has never smoked. He has never used smokeless tobacco. He reports current alcohol use. He reports that he does not use drugs.   Family History:   family history includes Breast cancer in an other family member; Cancer in his father; Colon cancer (age of onset: 48) in his father; Colon polyps in his brother and father; Diabetes in his father and another family member; Healthy in his sister; Heart disease in his mother; High Cholesterol in his father; Hypertension in his brother, brother, father, maternal grandmother, mother, and another family member; Obesity in his father; Stomach cancer in an other family member.    Review of Systems: Review of Systems  Constitutional: Negative.   HENT: Negative.    Respiratory: Negative.    Cardiovascular: Negative.   Gastrointestinal: Negative.   Musculoskeletal: Negative.   Neurological: Negative.   Psychiatric/Behavioral:  Negative.    All other systems reviewed and are negative.   PHYSICAL EXAM: VS:  BP (!) 158/86   Pulse 67   Ht 5' 7 (1.702 m)   Wt 238 lb 6.4 oz (108.1 kg)   SpO2 99%   BMI 37.34 kg/m  , BMI Body mass index is 37.34 kg/m. Constitutional:  oriented to person, place, and time. No distress.  HENT:  Head: Grossly normal Eyes:  no discharge. No scleral icterus.  Neck: No JVD, no carotid bruits  Cardiovascular: Regular rate and rhythm, no murmurs appreciated Pulmonary/Chest: Clear to auscultation bilaterally, no wheezes or rails Abdominal: Soft.  no distension.  no tenderness.  Musculoskeletal: Normal range of motion Neurological:  normal muscle tone. Coordination normal. No atrophy Skin: Skin warm and dry Psychiatric: normal affect, pleasant  Recent Labs: 09/22/2023: ALT 24 12/11/2023: Hemoglobin 14.6; Platelets 154.0; TSH 1.22 03/22/2024: B Natriuretic Peptide 42.1; BUN 20; Creatinine, Ser 0.86; Potassium 4.2; Sodium 136    Lipid Panel Lab Results  Component Value Date   CHOL 124 03/22/2024  HDL 32 (L) 03/22/2024   LDLCALC 77 03/22/2024   TRIG 76 03/22/2024      Wt Readings from Last 3 Encounters:  03/23/24 238 lb 6.4 oz (108.1 kg)  03/22/24 237 lb (107.5 kg)  12/11/23 237 lb (107.5 kg)       ASSESSMENT AND PLAN:  Problem List Items Addressed This Visit   None   Cardiomyopathy, nonischemic Etiology unclear, postviral, hypertensive heart disease, other Followed by advanced heart failure clinic Appears euvolemic Elevated blood pressure today, was rushing but typically blood pressure well-controlled No medication changes made given recent change to metoprolol  succinate Good exercise tolerance  Essential hypertension Blood pressure elevated today though typically well-controlled at home.  He was rushing this morning to get wife into surgery in [place] Starting to improve on recheck He will continue to monitor at home and call us  if it runs  high  Bradycardia Heart rate 67 on metoprolol  succinate, dose recently increased up to 75 daily, recommend he closely monitor heart rate  Diabetes type 2 A1c running 5.4, well-controlled Exercising on a regular basis   Signed, Velinda Lunger, M.D., Ph.D. [facility] [place], [place] [phone]

## 2024-03-23 NOTE — Patient Instructions (Signed)

## 2024-03-24 DIAGNOSIS — N401 Enlarged prostate with lower urinary tract symptoms: Secondary | ICD-10-CM | POA: Diagnosis not present

## 2024-04-04 NOTE — Progress Notes (Addendum)
 Robert Mcguire                                          MRN: [id number]   04/04/2024   The [facility] Quality Team Specialist reviewed this patient medical record for the purposes of chart review for care gap closure. The following were reviewed: chart review for care gap closure-kidney health evaluation for diabetes:eGFR  and uACR. No uACR found.  04/13/2024 Chart reviewed for CBP, most recent out of range.  06/16/2024- no cbp, no ked labs   [employer]

## 2024-04-06 DIAGNOSIS — H353211 Exudative age-related macular degeneration, right eye, with active choroidal neovascularization: Secondary | ICD-10-CM | POA: Diagnosis not present

## 2024-04-15 ENCOUNTER — Other Ambulatory Visit: Payer: Self-pay | Admitting: Cardiology

## 2024-04-19 ENCOUNTER — Ambulatory Visit

## 2024-04-19 ENCOUNTER — Ambulatory Visit: Admitting: Cardiovascular Disease

## 2024-04-19 VITALS — Ht 67.0 in | Wt 237.0 lb

## 2024-04-19 DIAGNOSIS — Z800 Family history of malignant neoplasm of digestive organs: Secondary | ICD-10-CM

## 2024-04-19 DIAGNOSIS — Z860100 Personal history of colon polyps, unspecified: Secondary | ICD-10-CM

## 2024-04-19 MED ORDER — NA SULFATE-K SULFATE-MG SULF 17.5-3.13-1.6 GM/177ML PO SOLN: 1.0000 | Freq: Once | ORAL | 0 refills | Status: AC

## 2024-04-19 NOTE — Progress Notes (Signed)
 No egg or soy allergy known to patient  No issues known to pt with past sedation with any surgeries or procedures Patient denies ever being told they had issues or difficulty with intubation - Patient states he was told he was difficult to intubate in 2010, but has been intubated since that time without difficulty.  Anesthesia notified. No FH of Malignant Hyperthermia Pt is not on diet pills Pt is not on  home 02  Pt is not on blood thinners  Pt denies issues with constipation  No A fib or A flutter Have any cardiac testing pending--No Pt can ambulate  Pt denies use of chewing tobacco Discussed diabetic I weight loss medication holds Discussed NSAID holds Checked BMI Pt instructed to use Singlecare.com or GoodRx for a price reduction on prep  Patient's chart reviewed by Norleen Schillings CNRA prior to previsit and patient appropriate for the [facility].  Pre visit completed and red dot placed by patient's name on their procedure day (on provider's schedule).

## 2024-04-28 ENCOUNTER — Encounter: Payer: Self-pay | Admitting: Gastroenterology

## 2024-05-04 DIAGNOSIS — H353211 Exudative age-related macular degeneration, right eye, with active choroidal neovascularization: Secondary | ICD-10-CM | POA: Diagnosis not present

## 2024-05-05 DIAGNOSIS — N401 Enlarged prostate with lower urinary tract symptoms: Secondary | ICD-10-CM | POA: Diagnosis not present

## 2024-05-06 ENCOUNTER — Encounter: Payer: Self-pay | Admitting: Gastroenterology

## 2024-05-06 ENCOUNTER — Ambulatory Visit (AMBULATORY_SURGERY_CENTER): Admitting: Gastroenterology

## 2024-05-06 VITALS — BP 119/71 | HR 63 | Temp 98.0°F | Resp 14 | Ht 67.0 in | Wt 237.0 lb

## 2024-05-06 DIAGNOSIS — E119 Type 2 diabetes mellitus without complications: Secondary | ICD-10-CM | POA: Diagnosis not present

## 2024-05-06 DIAGNOSIS — Z1211 Encounter for screening for malignant neoplasm of colon: Secondary | ICD-10-CM

## 2024-05-06 DIAGNOSIS — E785 Hyperlipidemia, unspecified: Secondary | ICD-10-CM | POA: Diagnosis not present

## 2024-05-06 DIAGNOSIS — D123 Benign neoplasm of transverse colon: Secondary | ICD-10-CM | POA: Diagnosis not present

## 2024-05-06 DIAGNOSIS — Z860101 Personal history of adenomatous and serrated colon polyps: Secondary | ICD-10-CM | POA: Diagnosis not present

## 2024-05-06 DIAGNOSIS — K648 Other hemorrhoids: Secondary | ICD-10-CM | POA: Diagnosis not present

## 2024-05-06 DIAGNOSIS — D120 Benign neoplasm of cecum: Secondary | ICD-10-CM

## 2024-05-06 DIAGNOSIS — Q438 Other specified congenital malformations of intestine: Secondary | ICD-10-CM

## 2024-05-06 DIAGNOSIS — I10 Essential (primary) hypertension: Secondary | ICD-10-CM | POA: Diagnosis not present

## 2024-05-06 DIAGNOSIS — I509 Heart failure, unspecified: Secondary | ICD-10-CM | POA: Diagnosis not present

## 2024-05-06 DIAGNOSIS — Z860100 Personal history of colon polyps, unspecified: Secondary | ICD-10-CM

## 2024-05-06 DIAGNOSIS — K635 Polyp of colon: Secondary | ICD-10-CM | POA: Diagnosis not present

## 2024-05-06 DIAGNOSIS — D122 Benign neoplasm of ascending colon: Secondary | ICD-10-CM

## 2024-05-06 MED ORDER — SODIUM CHLORIDE 0.9 % IV SOLN: 500.0000 mL | INTRAVENOUS | Status: DC

## 2024-05-06 NOTE — Patient Instructions (Signed)
 Resume previous diet  Continue present medications  Await pathology results  Repeat colonoscopy in 3 years for surveillance  See handout on polyps YOU HAD AN ENDOSCOPIC PROCEDURE TODAY AT THE [facility]:   Refer to the procedure report that was given to you for any specific questions about what was found during the examination.  If the procedure report does not answer your questions, please call your gastroenterologist to clarify.  If you requested that your care partner not be given the details of your procedure findings, then the procedure report has been included in a sealed envelope for you to review at your convenience later.  YOU SHOULD EXPECT: Some feelings of bloating in the abdomen. Passage of more gas than usual.  Walking can help get rid of the air that was put into your GI tract during the procedure and reduce the bloating. If you had a lower endoscopy (such as a colonoscopy or flexible sigmoidoscopy) you may notice spotting of blood in your stool or on the toilet paper. If you underwent a bowel prep for your procedure, you may not have a normal bowel movement for a few days.  Please Note:  You might notice some irritation and congestion in your nose or some drainage.  This is from the oxygen used during your procedure.  There is no need for concern and it should clear up in a day or so.  SYMPTOMS TO REPORT IMMEDIATELY: Following lower endoscopy (colonoscopy or flexible sigmoidoscopy):  Excessive amounts of blood in the stool  Significant tenderness or worsening of abdominal pains  Swelling of the abdomen that is new, acute  Fever of 100F or higher  For urgent or emergent issues, a gastroenterologist can be reached at any hour by calling [phone]. Do not use MyChart messaging for urgent concerns.   DIET:  We do recommend a small meal at first, but then you may proceed to your regular diet.  Drink plenty of fluids but you should avoid alcoholic beverages for 24  hours.  ACTIVITY:  You should plan to take it easy for the rest of today and you should NOT DRIVE or use heavy machinery until tomorrow (because of the sedation medicines used during the test).    FOLLOW UP: Our staff will call the number listed on your records the next business day following your procedure.  We will call around 7:15- 8:00 am to check on you and address any questions or concerns that you may have regarding the information given to you following your procedure. If we do not reach you, we will leave a message.     If any biopsies were taken you will be contacted by phone or by letter within the next 1-3 weeks.  Please call us  at [phone] if you have not heard about the biopsies in 3 weeks.   SIGNATURES/CONFIDENTIALITY: You and/or your care partner have signed paperwork which will be entered into your electronic medical record.  These signatures attest to the fact that that the information above on your After Visit Summary has been reviewed and is understood.  Full responsibility of the confidentiality of this discharge information lies with you and/or your care-partner.

## 2024-05-06 NOTE — Progress Notes (Signed)
 Called to room to assist during endoscopic procedure.  Patient ID and intended procedure confirmed with present staff. Received instructions for my participation in the procedure from the performing physician.

## 2024-05-06 NOTE — Progress Notes (Signed)
Pt states no changes to health hx since previsit

## 2024-05-06 NOTE — Op Note (Signed)
 [facility] Patient Name: Robert Mcguire Procedure Date: 05/06/2024 8:07 AM MRN: [id number] Endoscopist: Victory L. Legrand , MD, [id number] Age: 63 Referring MD:  Date of Birth: 11/05/1960 Gender: Male Account #: [id number] Procedure:                Colonoscopy Indications:              Personal history of colonic polyps                           Diminutive SSP x 2 in June 2020                           TA x 4 and 10mm TVA April 2017                           Polyps in 2012 Medicines:                Monitored Anesthesia Care Procedure:                Pre-Anesthesia Assessment:                           - Prior to the procedure, a History and Physical                            was performed, and patient medications and                            allergies were reviewed. The patient's tolerance of                            previous anesthesia was also reviewed. The risks                            and benefits of the procedure and the sedation                            options and risks were discussed with the patient.                            All questions were answered, and informed consent                            was obtained. Prior Anticoagulants: The patient has                            taken no anticoagulant or antiplatelet agents. ASA                            Grade Assessment: III - A patient with severe                            systemic disease. After reviewing the risks and  benefits, the patient was deemed in satisfactory                            condition to undergo the procedure.                           After obtaining informed consent, the colonoscope                            was passed under direct vision. Throughout the                            procedure, the patient's blood pressure, pulse, and                            oxygen saturations were monitored continuously. The                            Olympus Scope SN:  [id number] was introduced through                            the anus and advanced to the the cecum, identified                            by appendiceal orifice and ileocecal valve. The                            colonoscopy was somewhat difficult due to a                            redundant colon. Successful completion of the                            procedure was aided by straightening and shortening                            the scope to obtain bowel loop reduction. The                            patient tolerated the procedure well. The quality                            of the bowel preparation was excellent. The                            ileocecal valve, appendiceal orifice, and rectum                            were photographed. The bowel preparation used was                            SUPREP. Scope In: 8:13:54 AM Scope Out: 8:38:48 AM Scope Withdrawal Time: 0 hours 19 minutes 53 seconds  Total Procedure Duration: 0 hours 24  minutes 54 seconds  Findings:                 The perianal and digital rectal examinations were                            normal.                           Repeat examination of right colon under NBI                            performed.                           Four sessile polyps were found in the transverse                            colon (2) and ascending colon (2). The polyps were                            3 to 5 mm in size. These polyps were removed with a                            cold snare. Resection and retrieval were complete.                            (Jar 2)                           A 10 mm polyp was found in the cecum. The polyp was                            pedunculated. The polyp was removed with a hot                            snare. Resection and retrieval were complete.                            (Removed en bloc with additional STSC at stalk                            base, then cut with cold snare for retrieval) - Jar                             A 5 mm polyp was found in the proximal ascending                            colon. The polyp was semi-pedunculated. The polyp                            was removed with a hot snare. Resection and  retrieval were complete. - Jar 1                           Internal hemorrhoids were found.                           The exam was otherwise without abnormality on                            direct and retroflexion views. Complications:            No immediate complications. Estimated Blood Loss:     Estimated blood loss was minimal. Impression:               - Four 3 to 5 mm polyps in the transverse colon and                            in the ascending colon, removed with a cold snare.                            Resected and retrieved.                           - One 10 mm polyp in the cecum, removed with a hot                            snare. Resected and retrieved.                           - One 5 mm polyp in the proximal ascending colon,                            removed with a hot snare. Resected and retrieved.                           - Internal hemorrhoids.                           - The examination was otherwise normal on direct                            and retroflexion views. Recommendation:           - Patient has a contact number available for                            emergencies. The signs and symptoms of potential                            delayed complications were discussed with the                            patient. Return to normal activities tomorrow.                            Written  discharge instructions were provided to the                            patient.                           - Resume previous diet.                           - Continue present medications.                           - Await pathology results.                           - Repeat colonoscopy in 3 years for surveillance. Niylah Hassan L. Legrand,  MD 05/06/2024 8:47:29 AM This report has been signed electronically.

## 2024-05-06 NOTE — Progress Notes (Signed)
 Report given to PACU, vss

## 2024-05-06 NOTE — Progress Notes (Signed)
 History and Physical:  This patient presents for endoscopic testing for: Encounter Diagnosis  Name Primary?   Hx of colonic polyps Yes    62 year old man here for surveillance colonoscopy. History of diminutive SSP x 2 in June 2020 , tubular adenoma x 4 and a 10 mm tubulovillous adenoma in April 2017 as well as polyps on a 2012 colonoscopy.  Patient denies chronic abdominal pain, rectal bleeding, constipation or diarrhea.   Patient is otherwise without complaints or active issues today.   Past Medical History: Past Medical History:  Diagnosis Date   Allergic rhinitis    Allergy    Arthritis    CHF (congestive heart failure) (HCC)    Diabetes mellitus without complication (HCC)    ED (erectile dysfunction)    GERD (gastroesophageal reflux disease)    Glaucoma    High cholesterol    Hyperlipidemia    Hypertension    Impaired glucose tolerance    Nephrolithiasis    Obesity      Past Surgical History: Past Surgical History:  Procedure Laterality Date   COLONOSCOPY     difficultity with intabation     GYNECOMASTIA EXCISION Bilateral    POLYPECTOMY     right achilles tendon surgery      Allergies: No Known Allergies  Outpatient Meds: Current Outpatient Medications  Medication Sig Dispense Refill   alfuzosin  (UROXATRAL ) 10 MG 24 hr tablet Take 10 mg by mouth daily.     aspirin  EC 81 MG tablet Take 81 mg by mouth daily.     atorvastatin  (LIPITOR) 40 MG tablet TAKE 1 TABLET BY MOUTH EVERY DAY 30 tablet 8   cholecalciferol (VITAMIN D3) 25 MCG (1000 UNIT) tablet Take 1,000 Units by mouth daily.     Elderberry 500 MG CAPS Take 1 capsule by mouth daily.     erythromycin ophthalmic ointment Place 1 Application into the right eye once. After injection     Faricimab-svoa (VABYSMO IZ) by Intravitreal route every 30 (thirty) days.     isosorbide-hydrALAZINE  (BIDIL) 20-37.5 MG tablet Take 2 tablets by mouth 3 (three) times daily. 300 tablet 5   JARDIANCE  25 MG TABS tablet  TAKE 1 TABLET (25 MG TOTAL) BY MOUTH DAILY. 90 tablet 3   latanoprost  (XALATAN ) 0.005 % ophthalmic solution Place 1 drop into both eyes every evening.     metoprolol  succinate (TOPROL -XL) 25 MG 24 hr tablet Take 3 tablets (75 mg total) by mouth daily. Take with or immediately following a meal. 90 tablet 11   metoprolol  succinate (TOPROL -XL) 50 MG 24 hr tablet Take 50 mg by mouth daily. Takes 50mg  and a 25mg  daily (75mg  total)     Multiple Vitamin (MULTIVITAMIN ADULT PO) Take 1 tablet by mouth daily.     Multiple Vitamins-Minerals (PRESERVISION AREDS 2) CAPS Take 1 capsule by mouth in the morning and at bedtime.     sacubitril -valsartan  (ENTRESTO ) 97-103 MG TAKE 1 TABLET BY MOUTH TWICE A DAY 60 tablet 11   spironolactone  (ALDACTONE ) 25 MG tablet Take 1 tablet (25 mg total) by mouth once for 1 dose. 30 tablet 2   timolol  (TIMOPTIC ) 0.5 % ophthalmic solution Place 1 drop into both eyes daily.     betamethasone dipropionate 0.05 % lotion Apply 1 Application topically 2 (two) times daily. (Apply thin layer to scalp and face)     ketoconazole (NIZORAL) 2 % shampoo Apply 1 Application topically 3 (three) times a week.     pantoprazole  (PROTONIX ) 20 MG tablet Take 20 mg  by mouth daily as needed for heartburn.     Current Facility-Administered Medications  Medication Dose Route Frequency Provider Last Rate Last Admin   0.9 %  sodium chloride  infusion  500 mL Intravenous Continuous Danis, Victory CROME III, MD          ___________________________________________________________________ Objective   Exam:  BP (!) 144/75   Pulse 68   Temp 98 F (36.7 C)   Ht 5' 7 (1.702 m)   Wt 237 lb (107.5 kg)   SpO2 99%   BMI 37.12 kg/m   CV: regular , S1/S2 Resp: clear to auscultation bilaterally, normal RR and effort noted GI: soft, no tenderness, with active bowel sounds.   Assessment: Encounter Diagnosis  Name Primary?   Hx of colonic polyps Yes     Plan: Colonoscopy   The benefits and risks  of the planned procedure(s) were described in detail with the patient or (when appropriate) their health care proxy.  Risks were outlined as including, but not limited to, bleeding, infection, perforation, adverse medication reaction leading to cardiac or pulmonary decompensation, pancreatitis (if ERCP).  The limitation of incomplete mucosal visualization was also discussed.  No guarantees or warranties were given.  The patient is appropriate for an endoscopic procedure in the ambulatory setting.   - Victory Brand, MD

## 2024-05-09 ENCOUNTER — Telehealth: Payer: Self-pay

## 2024-05-09 NOTE — Telephone Encounter (Signed)
  Follow up Call-     05/06/2024    7:55 AM  Call back number  Post procedure Call Back phone  # [phone]  Permission to leave phone message Yes     Patient questions:  Do you have a fever, pain , or abdominal swelling? No. Pain Score  0 *  Have you tolerated food without any problems? Yes.    Have you been able to return to your normal activities? Yes.    Do you have any questions about your discharge instructions: Diet   No. Medications  No. Follow up visit  No.  Do you have questions or concerns about your Care? No.  Actions: * If pain score is 4 or above: No action needed, pain <4.

## 2024-05-10 ENCOUNTER — Ambulatory Visit: Payer: Self-pay | Admitting: Gastroenterology

## 2024-05-10 LAB — SURGICAL PATHOLOGY

## 2024-06-06 DIAGNOSIS — H353211 Exudative age-related macular degeneration, right eye, with active choroidal neovascularization: Secondary | ICD-10-CM | POA: Diagnosis not present

## 2024-06-11 ENCOUNTER — Other Ambulatory Visit: Payer: Self-pay | Admitting: Cardiology

## 2024-06-16 ENCOUNTER — Ambulatory Visit
Admission: EM | Admit: 2024-06-16 | Discharge: 2024-06-16 | Disposition: A | Discharge status: Home / Self Care | Attending: Emergency Medicine | Admitting: Emergency Medicine

## 2024-06-16 ENCOUNTER — Encounter: Payer: Self-pay | Admitting: Emergency Medicine

## 2024-06-16 DIAGNOSIS — R509 Fever, unspecified: Secondary | ICD-10-CM | POA: Diagnosis not present

## 2024-06-16 DIAGNOSIS — J101 Influenza due to other identified influenza virus with other respiratory manifestations: Secondary | ICD-10-CM | POA: Diagnosis not present

## 2024-06-16 DIAGNOSIS — B349 Viral infection, unspecified: Secondary | ICD-10-CM | POA: Diagnosis not present

## 2024-06-16 LAB — POC COVID19/FLU A&B COMBO
Covid Antigen, POC: NEGATIVE
Influenza A Antigen, POC: POSITIVE — AB
Influenza B Antigen, POC: NEGATIVE

## 2024-06-16 LAB — POCT RAPID STREP A (OFFICE): Rapid Strep A Screen: NEGATIVE

## 2024-06-16 MED ORDER — OSELTAMIVIR PHOSPHATE 75 MG PO CAPS: 75.0000 mg | ORAL_CAPSULE | Freq: Two times a day (BID) | ORAL | 0 refills | Status: AC

## 2024-06-16 MED ORDER — IBUPROFEN 800 MG PO TABS
800.0000 mg | ORAL_TABLET | Freq: Once | ORAL | Status: AC
  Administered 2024-06-16: 800 mg via ORAL

## 2024-06-16 NOTE — ED Provider Notes (Signed)
 Robert Mcguire    CSN: [id number] Arrival date & time: 06/16/24  9195      History   Chief Complaint No chief complaint on file.   HPI Robert Mcguire is a 63 y.o. male.   Patient presents for evaluation of a fever peaking at 99, nasal congestion, sore throat and a nonproductive cough present for 2 days.  Associated wheezing occurring intermittently.  Possible sick contacts as he had a recent visit to the hospital.  Tolerable to food and liquids.  Has attempted use of Tylenol  and Mucinex D.  Denies respiratory history, non-smoker.    Past Medical History:  Diagnosis Date   Allergic rhinitis    Allergy    Arthritis    CHF (congestive heart failure) (HCC)    Diabetes mellitus without complication (HCC)    ED (erectile dysfunction)    GERD (gastroesophageal reflux disease)    Glaucoma    High cholesterol    Hyperlipidemia    Hypertension    Impaired glucose tolerance    Nephrolithiasis    Obesity     Patient Active Problem List   Diagnosis Date Noted   Exudative age-related macular degeneration of right eye (HCC) 12/11/2023   Microalbuminuria 07/23/2023   Congestive heart failure (HCC) 05/07/2023   Coronary artery disease 09/16/2022   Elevated troponin 08/21/2022   NICM (nonischemic cardiomyopathy) (HCC) 08/21/2022   Gastroenteritis 08/21/2022   Nausea and vomiting 08/21/2022   Diarrhea 08/21/2022   Vasovagal episode 08/21/2022   BMI 36.0-36.9,adult 08/19/2022   Obesity, Beginning BMI 36.34 08/19/2022   Bradycardia 04/08/2022   Elevated liver enzymes 01/30/2022   Other fatigue 01/28/2022   SOB (shortness of breath) on exertion 01/28/2022   Diabetes mellitus (HCC) 01/28/2022   Mixed hyperlipidemia 01/28/2022   Health care maintenance 01/28/2022   Vitamin D  deficiency 01/28/2022   Class 2 severe obesity with serious comorbidity and body mass index (BMI) of 36.0 to 36.9 in adult 01/28/2022   Type 2 diabetes mellitus without complication, without long-term  current use of insulin  (HCC) 08/20/2016   Hx of colonic polyps 09/30/2011   GYNECOMASTIA 01/15/2010   NEPHROLITHIASIS, HX OF 05/04/2008   SHOULDER PAIN, LEFT 11/08/2007   Obesity 01/22/2007   ERECTILE DYSFUNCTION 01/22/2007   Essential hypertension 01/22/2007   ALLERGIC RHINITIS, SEASONAL 01/22/2007   GERD 01/22/2007   HYPOKALEMIA, HX OF 01/22/2007    Past Surgical History:  Procedure Laterality Date   COLONOSCOPY     difficultity with intabation     GYNECOMASTIA EXCISION Bilateral    POLYPECTOMY     right achilles tendon surgery         Home Medications    Prior to Admission medications  Medication Sig Start Date End Date Taking? Authorizing Provider  alfuzosin  (UROXATRAL ) 10 MG 24 hr tablet Take 10 mg by mouth daily.    [provider]  aspirin  EC 81 MG tablet Take 81 mg by mouth daily.    [provider]  atorvastatin  (LIPITOR) 40 MG tablet TAKE 1 TABLET BY MOUTH EVERY DAY 09/23/23   [name], MD  betamethasone dipropionate 0.05 % lotion Apply 1 Application topically 2 (two) times daily. (Apply thin layer to scalp and face)    [provider]  cholecalciferol (VITAMIN D3) 25 MCG (1000 UNIT) tablet Take 1,000 Units by mouth daily.    [provider]  Elderberry 500 MG CAPS Take 1 capsule by mouth daily.    [provider]  erythromycin ophthalmic ointment Place  1 Application into the right eye once. After injection 10/26/23   [provider]  Faricimab-svoa (VABYSMO IZ) by Intravitreal route every 30 (thirty) days.    [provider]  isosorbide-hydrALAZINE  (BIDIL) 20-37.5 MG tablet TAKE 2 TABLETS BY MOUTH 3 TIMES DAILY. 06/13/24   Rolan Ezra RAMAN, MD  JARDIANCE  25 MG TABS tablet TAKE 1 TABLET (25 MG TOTAL) BY MOUTH DAILY. 09/23/23   Mercer Clotilda SAUNDERS, MD  ketoconazole (NIZORAL) 2 % shampoo Apply 1 Application topically 3 (three) times a week.    [provider]  latanoprost  (XALATAN ) 0.005 %  ophthalmic solution Place 1 drop into both eyes every evening.    [provider]  metoprolol  succinate (TOPROL -XL) 25 MG 24 hr tablet Take 3 tablets (75 mg total) by mouth daily. Take with or immediately following a meal. 03/22/24 03/17/25  Rolan Ezra RAMAN, MD  metoprolol  succinate (TOPROL -XL) 50 MG 24 hr tablet Take 50 mg by mouth daily. Takes 50mg  and a 25mg  daily (75mg  total) 04/16/24   [provider]  Multiple Vitamin (MULTIVITAMIN ADULT PO) Take 1 tablet by mouth daily.    [provider]  Multiple Vitamins-Minerals (PRESERVISION AREDS 2) CAPS Take 1 capsule by mouth in the morning and at bedtime.    [provider]  pantoprazole  (PROTONIX ) 20 MG tablet Take 20 mg by mouth daily as needed for heartburn.    [provider]  sacubitril -valsartan  (ENTRESTO ) 97-103 MG TAKE 1 TABLET BY MOUTH TWICE A DAY 04/15/24   Rolan Ezra RAMAN, MD  spironolactone  (ALDACTONE ) 25 MG tablet TAKE 1 TABLET (25 MG TOTAL) BY MOUTH ONCE FOR 1 DOSE. 06/13/24 06/13/24  Rolan Ezra RAMAN, MD  timolol  (TIMOPTIC ) 0.5 % ophthalmic solution Place 1 drop into both eyes daily.    [provider]    Family History Family History  Problem Relation Age of Onset   Hypertension Mother    Heart disease Mother        Pacemaker   Obesity Father    Diabetes Father    Hypertension Father    Colon cancer Father 61   Colon polyps Father    High Cholesterol Father    Cancer Father    Healthy Sister    Hypertension Brother    Colon polyps Brother    Hypertension Brother    Hypertension Maternal Grandmother    Diabetes Other    Hypertension Other    Breast cancer Other        breast, colon    Stomach cancer Other        paternal great grand father   Esophageal cancer Neg Hx    Rectal cancer Neg Hx    Liver disease Neg Hx     Social History Social History[1]   Allergies   Patient has no known allergies.   Review of Systems Review of Systems  Constitutional:   Positive for fever. Negative for activity change, appetite change, chills, diaphoresis, fatigue and unexpected weight change.  HENT:  Positive for congestion and sore throat. Negative for dental problem, drooling, ear discharge, ear pain, facial swelling, hearing loss, mouth sores, nosebleeds, postnasal drip, rhinorrhea, sinus pressure, sinus pain, sneezing, tinnitus, trouble swallowing and voice change.   Respiratory:  Positive for cough and wheezing. Negative for apnea, choking, chest tightness, shortness of breath and stridor.   Gastrointestinal: Negative.   Musculoskeletal: Negative.      Physical Exam Triage Vital Signs ED Triage Vitals  Encounter Vitals Group     BP --  Girls Systolic BP Percentile --      Girls Diastolic BP Percentile --      Boys Systolic BP Percentile --      Boys Diastolic BP Percentile --      Pulse Rate 06/16/24 0840 92     Resp 06/16/24 0840 20     Temp 06/16/24 0840 (!) 100.7 F (38.2 C)     Temp Source 06/16/24 0840 Oral     SpO2 06/16/24 0840 98 %     Weight --      Height --      Head Circumference --      Peak Flow --      Pain Score 06/16/24 0844 7     Pain Loc --      Pain Education --      Exclude from Growth Chart --    No data found.  Updated Vital Signs Pulse 92   Temp (!) 100.7 F (38.2 C) (Oral)   Resp 20   SpO2 98%   Visual Acuity Right Eye Distance:   Left Eye Distance:   Bilateral Distance:    Right Eye Near:   Left Eye Near:    Bilateral Near:     Physical Exam Constitutional:      Appearance: Normal appearance.  HENT:     Right Ear: Tympanic membrane, ear canal and external ear normal.     Left Ear: Tympanic membrane, ear canal and external ear normal.     Nose: Congestion present.     Mouth/Throat:     Pharynx: No oropharyngeal exudate or posterior oropharyngeal erythema.  Eyes:     Extraocular Movements: Extraocular movements intact.  Cardiovascular:     Rate and Rhythm: Normal rate and regular rhythm.      Pulses: Normal pulses.     Heart sounds: Normal heart sounds.  Pulmonary:     Effort: Pulmonary effort is normal.     Breath sounds: Normal breath sounds.  Neurological:     Mental Status: He is alert and oriented to person, place, and time. Mental status is at baseline.      UC Treatments / Results  Labs (all labs ordered are listed, but only abnormal results are displayed) Labs Reviewed  POC COVID19/FLU A&B COMBO  POCT RAPID STREP A (OFFICE)    EKG   Radiology No results found.  Procedures Procedures (including critical care time)  Medications Ordered in UC Medications  ibuprofen  (ADVIL ) tablet 800 mg (has no administration in time range)    Initial Impression / Assessment and Plan / UC Course  I have reviewed the triage vital signs and the nursing notes.  Pertinent labs & imaging results that were available during my care of the patient were reviewed by me and considered in my medical decision making (see chart for details).  Viral illness, influenza A, fever  Fever 100.7 noted in triage, had taken Tylenol  prior to visit, ibuprofen  800 mg given in clinic, no signs of sepsis, patient in no signs of distress nontoxic-appearing, stable for outpatient management, COVID and strep testing negative, prescribed Tamiflu and viscous lidocaine and sore throat most most worrisome symptom, recommend additional over-the-counter medications and nonpharmacological supportive care with follow-up as needed Final Clinical Impressions(s) / UC Diagnoses   Final diagnoses:  Viral illness   Discharge Instructions   None    ED Prescriptions   None    PDMP not reviewed this encounter.     [1]  Social History Tobacco Use  Smoking status: Never   Smokeless tobacco: Never  Vaping Use   Vaping status: Never Used  Substance Use Topics   Alcohol use: Yes    Alcohol/week: 0.0 standard drinks of alcohol    Comment: occasionally   Drug use: No     Teresa Shelba SAUNDERS,  NP 06/16/24 0900

## 2024-06-16 NOTE — Discharge Instructions (Addendum)
 Influenza A is a virus and should steadily improve in time it can take up to 7 to 10 days before you truly start to see a turnaround however things will get better  COVID and strep testing negative  Begin Tamiflu every morning and every evening for 5 days to reduce the amount of virus in the body which helps to minimize symptoms  Will need to quarantine and stay home until 24 hours without a fever otherwise may continue activity with mask  May gargle and spit lidocaine solution every 4 hours to provide temporary relief to your throat pain     You can take Tylenol  and/or Ibuprofen  as needed for fever reduction and pain relief.   For cough: honey 1/2 to 1 teaspoon (you can dilute the honey in water or another fluid).  You can also use guaifenesin and dextromethorphan for cough. You can use a humidifier for chest congestion and cough.  If you don't have a humidifier, you can sit in the bathroom with the hot shower running.      For sore throat: try warm salt water gargles, cepacol lozenges, throat spray, warm tea or water with lemon/honey, popsicles or ice, or OTC cold relief medicine for throat discomfort.   For congestion: take a daily anti-histamine like Zyrtec, Claritin, and a oral decongestant, such as pseudoephedrine.  You can also use Flonase 1-2 sprays in each nostril daily.   It is important to stay hydrated: drink plenty of fluids (water, gatorade/powerade/pedialyte, juices, or teas) to keep your throat moisturized and help further relieve irritation/discomfort.

## 2024-06-16 NOTE — ED Triage Notes (Signed)
 Patient complains of sore throat, dry cough and chest congestion x 2 days. Patient has taken Tylenol  and mucinex -D with mild relief. Rates pain 7/10.

## 2024-06-23 DIAGNOSIS — H2513 Age-related nuclear cataract, bilateral: Secondary | ICD-10-CM | POA: Diagnosis not present

## 2024-06-23 DIAGNOSIS — H353211 Exudative age-related macular degeneration, right eye, with active choroidal neovascularization: Secondary | ICD-10-CM | POA: Diagnosis not present

## 2024-06-23 DIAGNOSIS — H401132 Primary open-angle glaucoma, bilateral, moderate stage: Secondary | ICD-10-CM | POA: Diagnosis not present

## 2024-06-23 DIAGNOSIS — E119 Type 2 diabetes mellitus without complications: Secondary | ICD-10-CM | POA: Diagnosis not present

## 2024-06-23 LAB — OPHTHALMOLOGY REPORT-SCANNED

## 2024-07-18 ENCOUNTER — Telehealth: Payer: Self-pay | Admitting: Cardiology

## 2024-07-18 MED ORDER — ISOSORB DINITRATE-HYDRALAZINE 20-37.5 MG PO TABS: 2.0000 | ORAL_TABLET | Freq: Three times a day (TID) | ORAL | 5 refills | Status: AC

## 2024-07-18 NOTE — Telephone Encounter (Signed)
 Refill sent to preferred pharmacy.

## 2024-08-29 ENCOUNTER — Ambulatory Visit: Admitting: Cardiology
